# Patient Record
Sex: Male | Born: 1950 | Race: White | Hispanic: No | Marital: Married | Smoking: Current every day smoker
Health system: Southern US, Community
[De-identification: ages and names within clinical notes are randomized; demographics above are authoritative.]

## PROBLEM LIST (undated history)

## (undated) DIAGNOSIS — M1990 Unspecified osteoarthritis, unspecified site: Secondary | ICD-10-CM

## (undated) DIAGNOSIS — J189 Pneumonia, unspecified organism: Secondary | ICD-10-CM

## (undated) DIAGNOSIS — E785 Hyperlipidemia, unspecified: Secondary | ICD-10-CM

## (undated) DIAGNOSIS — I10 Essential (primary) hypertension: Secondary | ICD-10-CM

## (undated) DIAGNOSIS — K859 Acute pancreatitis without necrosis or infection, unspecified: Secondary | ICD-10-CM

## (undated) DIAGNOSIS — M109 Gout, unspecified: Secondary | ICD-10-CM

## (undated) HISTORY — PX: CHOLECYSTECTOMY: SHX55

## (undated) HISTORY — DX: Unspecified osteoarthritis, unspecified site: M19.90

## (undated) HISTORY — DX: Hyperlipidemia, unspecified: E78.5

---

## 1995-10-18 HISTORY — PX: KNEE SURGERY: SHX244

## 2008-07-25 ENCOUNTER — Emergency Department (HOSPITAL_COMMUNITY): Admission: EM | Admit: 2008-07-25 | Discharge: 2008-07-26 | Payer: Self-pay | Admitting: Emergency Medicine

## 2011-07-19 DIAGNOSIS — Z720 Tobacco use: Secondary | ICD-10-CM | POA: Insufficient documentation

## 2015-10-25 DIAGNOSIS — M25511 Pain in right shoulder: Secondary | ICD-10-CM | POA: Diagnosis not present

## 2015-11-03 DIAGNOSIS — M1009 Idiopathic gout, multiple sites: Secondary | ICD-10-CM | POA: Diagnosis not present

## 2015-11-03 DIAGNOSIS — F17218 Nicotine dependence, cigarettes, with other nicotine-induced disorders: Secondary | ICD-10-CM | POA: Diagnosis not present

## 2015-12-14 DIAGNOSIS — M10031 Idiopathic gout, right wrist: Secondary | ICD-10-CM | POA: Diagnosis not present

## 2015-12-14 DIAGNOSIS — M1009 Idiopathic gout, multiple sites: Secondary | ICD-10-CM | POA: Diagnosis not present

## 2015-12-24 DIAGNOSIS — M10031 Idiopathic gout, right wrist: Secondary | ICD-10-CM | POA: Diagnosis not present

## 2016-01-11 DIAGNOSIS — M25511 Pain in right shoulder: Secondary | ICD-10-CM | POA: Diagnosis not present

## 2016-01-11 DIAGNOSIS — M7551 Bursitis of right shoulder: Secondary | ICD-10-CM | POA: Diagnosis not present

## 2016-01-25 DIAGNOSIS — M25511 Pain in right shoulder: Secondary | ICD-10-CM | POA: Diagnosis not present

## 2016-01-25 DIAGNOSIS — M7551 Bursitis of right shoulder: Secondary | ICD-10-CM | POA: Diagnosis not present

## 2016-02-01 DIAGNOSIS — Z79899 Other long term (current) drug therapy: Secondary | ICD-10-CM | POA: Diagnosis not present

## 2016-02-01 DIAGNOSIS — M25511 Pain in right shoulder: Secondary | ICD-10-CM | POA: Diagnosis not present

## 2016-02-01 DIAGNOSIS — M79641 Pain in right hand: Secondary | ICD-10-CM | POA: Diagnosis not present

## 2016-02-01 DIAGNOSIS — M1009 Idiopathic gout, multiple sites: Secondary | ICD-10-CM | POA: Diagnosis not present

## 2016-02-29 DIAGNOSIS — R768 Other specified abnormal immunological findings in serum: Secondary | ICD-10-CM | POA: Diagnosis not present

## 2016-02-29 DIAGNOSIS — M1009 Idiopathic gout, multiple sites: Secondary | ICD-10-CM | POA: Diagnosis not present

## 2016-03-02 DIAGNOSIS — S161XXA Strain of muscle, fascia and tendon at neck level, initial encounter: Secondary | ICD-10-CM | POA: Diagnosis not present

## 2016-03-02 DIAGNOSIS — M9902 Segmental and somatic dysfunction of thoracic region: Secondary | ICD-10-CM | POA: Diagnosis not present

## 2016-03-02 DIAGNOSIS — M62830 Muscle spasm of back: Secondary | ICD-10-CM | POA: Diagnosis not present

## 2016-03-02 DIAGNOSIS — M9901 Segmental and somatic dysfunction of cervical region: Secondary | ICD-10-CM | POA: Diagnosis not present

## 2016-03-02 DIAGNOSIS — M9903 Segmental and somatic dysfunction of lumbar region: Secondary | ICD-10-CM | POA: Diagnosis not present

## 2016-03-02 DIAGNOSIS — M5382 Other specified dorsopathies, cervical region: Secondary | ICD-10-CM | POA: Diagnosis not present

## 2016-03-07 DIAGNOSIS — M9901 Segmental and somatic dysfunction of cervical region: Secondary | ICD-10-CM | POA: Diagnosis not present

## 2016-03-07 DIAGNOSIS — S161XXA Strain of muscle, fascia and tendon at neck level, initial encounter: Secondary | ICD-10-CM | POA: Diagnosis not present

## 2016-03-07 DIAGNOSIS — M62830 Muscle spasm of back: Secondary | ICD-10-CM | POA: Diagnosis not present

## 2016-03-07 DIAGNOSIS — M9903 Segmental and somatic dysfunction of lumbar region: Secondary | ICD-10-CM | POA: Diagnosis not present

## 2016-03-07 DIAGNOSIS — M9902 Segmental and somatic dysfunction of thoracic region: Secondary | ICD-10-CM | POA: Diagnosis not present

## 2016-03-07 DIAGNOSIS — M5382 Other specified dorsopathies, cervical region: Secondary | ICD-10-CM | POA: Diagnosis not present

## 2016-03-09 DIAGNOSIS — M9901 Segmental and somatic dysfunction of cervical region: Secondary | ICD-10-CM | POA: Diagnosis not present

## 2016-03-09 DIAGNOSIS — M9903 Segmental and somatic dysfunction of lumbar region: Secondary | ICD-10-CM | POA: Diagnosis not present

## 2016-03-09 DIAGNOSIS — M5382 Other specified dorsopathies, cervical region: Secondary | ICD-10-CM | POA: Diagnosis not present

## 2016-03-09 DIAGNOSIS — M9902 Segmental and somatic dysfunction of thoracic region: Secondary | ICD-10-CM | POA: Diagnosis not present

## 2016-03-09 DIAGNOSIS — M62830 Muscle spasm of back: Secondary | ICD-10-CM | POA: Diagnosis not present

## 2016-03-09 DIAGNOSIS — S161XXA Strain of muscle, fascia and tendon at neck level, initial encounter: Secondary | ICD-10-CM | POA: Diagnosis not present

## 2016-03-15 DIAGNOSIS — M62830 Muscle spasm of back: Secondary | ICD-10-CM | POA: Diagnosis not present

## 2016-03-15 DIAGNOSIS — M5382 Other specified dorsopathies, cervical region: Secondary | ICD-10-CM | POA: Diagnosis not present

## 2016-03-15 DIAGNOSIS — S161XXA Strain of muscle, fascia and tendon at neck level, initial encounter: Secondary | ICD-10-CM | POA: Diagnosis not present

## 2016-03-15 DIAGNOSIS — M9903 Segmental and somatic dysfunction of lumbar region: Secondary | ICD-10-CM | POA: Diagnosis not present

## 2016-03-15 DIAGNOSIS — M9902 Segmental and somatic dysfunction of thoracic region: Secondary | ICD-10-CM | POA: Diagnosis not present

## 2016-03-15 DIAGNOSIS — M9901 Segmental and somatic dysfunction of cervical region: Secondary | ICD-10-CM | POA: Diagnosis not present

## 2016-03-16 DIAGNOSIS — S161XXA Strain of muscle, fascia and tendon at neck level, initial encounter: Secondary | ICD-10-CM | POA: Diagnosis not present

## 2016-03-16 DIAGNOSIS — M9903 Segmental and somatic dysfunction of lumbar region: Secondary | ICD-10-CM | POA: Diagnosis not present

## 2016-03-16 DIAGNOSIS — M62830 Muscle spasm of back: Secondary | ICD-10-CM | POA: Diagnosis not present

## 2016-03-16 DIAGNOSIS — M9902 Segmental and somatic dysfunction of thoracic region: Secondary | ICD-10-CM | POA: Diagnosis not present

## 2016-03-16 DIAGNOSIS — M5382 Other specified dorsopathies, cervical region: Secondary | ICD-10-CM | POA: Diagnosis not present

## 2016-03-16 DIAGNOSIS — M9901 Segmental and somatic dysfunction of cervical region: Secondary | ICD-10-CM | POA: Diagnosis not present

## 2016-03-17 DIAGNOSIS — M9901 Segmental and somatic dysfunction of cervical region: Secondary | ICD-10-CM | POA: Diagnosis not present

## 2016-03-17 DIAGNOSIS — M62830 Muscle spasm of back: Secondary | ICD-10-CM | POA: Diagnosis not present

## 2016-03-17 DIAGNOSIS — M9902 Segmental and somatic dysfunction of thoracic region: Secondary | ICD-10-CM | POA: Diagnosis not present

## 2016-03-17 DIAGNOSIS — S161XXA Strain of muscle, fascia and tendon at neck level, initial encounter: Secondary | ICD-10-CM | POA: Diagnosis not present

## 2016-03-17 DIAGNOSIS — M9903 Segmental and somatic dysfunction of lumbar region: Secondary | ICD-10-CM | POA: Diagnosis not present

## 2016-03-17 DIAGNOSIS — M5382 Other specified dorsopathies, cervical region: Secondary | ICD-10-CM | POA: Diagnosis not present

## 2016-03-22 DIAGNOSIS — M5382 Other specified dorsopathies, cervical region: Secondary | ICD-10-CM | POA: Diagnosis not present

## 2016-03-22 DIAGNOSIS — M9903 Segmental and somatic dysfunction of lumbar region: Secondary | ICD-10-CM | POA: Diagnosis not present

## 2016-03-22 DIAGNOSIS — M62830 Muscle spasm of back: Secondary | ICD-10-CM | POA: Diagnosis not present

## 2016-03-22 DIAGNOSIS — S161XXA Strain of muscle, fascia and tendon at neck level, initial encounter: Secondary | ICD-10-CM | POA: Diagnosis not present

## 2016-03-22 DIAGNOSIS — M9902 Segmental and somatic dysfunction of thoracic region: Secondary | ICD-10-CM | POA: Diagnosis not present

## 2016-03-22 DIAGNOSIS — M9901 Segmental and somatic dysfunction of cervical region: Secondary | ICD-10-CM | POA: Diagnosis not present

## 2016-03-23 DIAGNOSIS — M9903 Segmental and somatic dysfunction of lumbar region: Secondary | ICD-10-CM | POA: Diagnosis not present

## 2016-03-23 DIAGNOSIS — M9901 Segmental and somatic dysfunction of cervical region: Secondary | ICD-10-CM | POA: Diagnosis not present

## 2016-03-23 DIAGNOSIS — M62830 Muscle spasm of back: Secondary | ICD-10-CM | POA: Diagnosis not present

## 2016-03-23 DIAGNOSIS — M5382 Other specified dorsopathies, cervical region: Secondary | ICD-10-CM | POA: Diagnosis not present

## 2016-03-23 DIAGNOSIS — S161XXA Strain of muscle, fascia and tendon at neck level, initial encounter: Secondary | ICD-10-CM | POA: Diagnosis not present

## 2016-03-23 DIAGNOSIS — M9902 Segmental and somatic dysfunction of thoracic region: Secondary | ICD-10-CM | POA: Diagnosis not present

## 2016-03-25 DIAGNOSIS — F17218 Nicotine dependence, cigarettes, with other nicotine-induced disorders: Secondary | ICD-10-CM | POA: Diagnosis not present

## 2016-03-25 DIAGNOSIS — M10031 Idiopathic gout, right wrist: Secondary | ICD-10-CM | POA: Diagnosis not present

## 2016-03-25 DIAGNOSIS — J410 Simple chronic bronchitis: Secondary | ICD-10-CM | POA: Diagnosis not present

## 2016-03-30 DIAGNOSIS — M9902 Segmental and somatic dysfunction of thoracic region: Secondary | ICD-10-CM | POA: Diagnosis not present

## 2016-03-30 DIAGNOSIS — M9901 Segmental and somatic dysfunction of cervical region: Secondary | ICD-10-CM | POA: Diagnosis not present

## 2016-03-30 DIAGNOSIS — M5382 Other specified dorsopathies, cervical region: Secondary | ICD-10-CM | POA: Diagnosis not present

## 2016-03-30 DIAGNOSIS — S161XXA Strain of muscle, fascia and tendon at neck level, initial encounter: Secondary | ICD-10-CM | POA: Diagnosis not present

## 2016-03-30 DIAGNOSIS — M9903 Segmental and somatic dysfunction of lumbar region: Secondary | ICD-10-CM | POA: Diagnosis not present

## 2016-03-30 DIAGNOSIS — M62830 Muscle spasm of back: Secondary | ICD-10-CM | POA: Diagnosis not present

## 2016-04-06 DIAGNOSIS — M9903 Segmental and somatic dysfunction of lumbar region: Secondary | ICD-10-CM | POA: Diagnosis not present

## 2016-04-06 DIAGNOSIS — M9902 Segmental and somatic dysfunction of thoracic region: Secondary | ICD-10-CM | POA: Diagnosis not present

## 2016-04-06 DIAGNOSIS — M62830 Muscle spasm of back: Secondary | ICD-10-CM | POA: Diagnosis not present

## 2016-04-06 DIAGNOSIS — M9901 Segmental and somatic dysfunction of cervical region: Secondary | ICD-10-CM | POA: Diagnosis not present

## 2016-04-06 DIAGNOSIS — M5382 Other specified dorsopathies, cervical region: Secondary | ICD-10-CM | POA: Diagnosis not present

## 2016-04-06 DIAGNOSIS — S161XXA Strain of muscle, fascia and tendon at neck level, initial encounter: Secondary | ICD-10-CM | POA: Diagnosis not present

## 2016-04-12 DIAGNOSIS — M9903 Segmental and somatic dysfunction of lumbar region: Secondary | ICD-10-CM | POA: Diagnosis not present

## 2016-04-12 DIAGNOSIS — M62830 Muscle spasm of back: Secondary | ICD-10-CM | POA: Diagnosis not present

## 2016-04-12 DIAGNOSIS — S161XXA Strain of muscle, fascia and tendon at neck level, initial encounter: Secondary | ICD-10-CM | POA: Diagnosis not present

## 2016-04-12 DIAGNOSIS — M9902 Segmental and somatic dysfunction of thoracic region: Secondary | ICD-10-CM | POA: Diagnosis not present

## 2016-04-12 DIAGNOSIS — M9901 Segmental and somatic dysfunction of cervical region: Secondary | ICD-10-CM | POA: Diagnosis not present

## 2016-04-12 DIAGNOSIS — M5382 Other specified dorsopathies, cervical region: Secondary | ICD-10-CM | POA: Diagnosis not present

## 2016-04-27 DIAGNOSIS — M9902 Segmental and somatic dysfunction of thoracic region: Secondary | ICD-10-CM | POA: Diagnosis not present

## 2016-04-27 DIAGNOSIS — M62830 Muscle spasm of back: Secondary | ICD-10-CM | POA: Diagnosis not present

## 2016-04-27 DIAGNOSIS — M5382 Other specified dorsopathies, cervical region: Secondary | ICD-10-CM | POA: Diagnosis not present

## 2016-04-27 DIAGNOSIS — S161XXA Strain of muscle, fascia and tendon at neck level, initial encounter: Secondary | ICD-10-CM | POA: Diagnosis not present

## 2016-04-27 DIAGNOSIS — M9903 Segmental and somatic dysfunction of lumbar region: Secondary | ICD-10-CM | POA: Diagnosis not present

## 2016-04-27 DIAGNOSIS — M9901 Segmental and somatic dysfunction of cervical region: Secondary | ICD-10-CM | POA: Diagnosis not present

## 2016-05-14 ENCOUNTER — Inpatient Hospital Stay (HOSPITAL_COMMUNITY): Payer: PPO

## 2016-05-14 ENCOUNTER — Emergency Department (HOSPITAL_COMMUNITY): Payer: PPO

## 2016-05-14 ENCOUNTER — Encounter (HOSPITAL_COMMUNITY): Payer: Self-pay | Admitting: *Deleted

## 2016-05-14 ENCOUNTER — Inpatient Hospital Stay (HOSPITAL_COMMUNITY)
Admission: EM | Admit: 2016-05-14 | Discharge: 2016-05-20 | DRG: 438 | Disposition: A | Discharge status: Home / Self Care | Payer: PPO | Attending: Internal Medicine | Admitting: Internal Medicine

## 2016-05-14 DIAGNOSIS — I509 Heart failure, unspecified: Secondary | ICD-10-CM | POA: Diagnosis not present

## 2016-05-14 DIAGNOSIS — K8309 Other cholangitis: Secondary | ICD-10-CM

## 2016-05-14 DIAGNOSIS — J9601 Acute respiratory failure with hypoxia: Secondary | ICD-10-CM | POA: Diagnosis not present

## 2016-05-14 DIAGNOSIS — E876 Hypokalemia: Secondary | ICD-10-CM | POA: Diagnosis not present

## 2016-05-14 DIAGNOSIS — Z79899 Other long term (current) drug therapy: Secondary | ICD-10-CM

## 2016-05-14 DIAGNOSIS — R0602 Shortness of breath: Secondary | ICD-10-CM

## 2016-05-14 DIAGNOSIS — K8050 Calculus of bile duct without cholangitis or cholecystitis without obstruction: Secondary | ICD-10-CM | POA: Diagnosis present

## 2016-05-14 DIAGNOSIS — R0600 Dyspnea, unspecified: Secondary | ICD-10-CM

## 2016-05-14 DIAGNOSIS — E8770 Fluid overload, unspecified: Secondary | ICD-10-CM | POA: Diagnosis not present

## 2016-05-14 DIAGNOSIS — R935 Abnormal findings on diagnostic imaging of other abdominal regions, including retroperitoneum: Secondary | ICD-10-CM | POA: Diagnosis not present

## 2016-05-14 DIAGNOSIS — R1011 Right upper quadrant pain: Secondary | ICD-10-CM | POA: Diagnosis not present

## 2016-05-14 DIAGNOSIS — I10 Essential (primary) hypertension: Secondary | ICD-10-CM | POA: Diagnosis not present

## 2016-05-14 DIAGNOSIS — K209 Esophagitis, unspecified: Secondary | ICD-10-CM | POA: Diagnosis not present

## 2016-05-14 DIAGNOSIS — T502X5A Adverse effect of carbonic-anhydrase inhibitors, benzothiadiazides and other diuretics, initial encounter: Secondary | ICD-10-CM | POA: Diagnosis present

## 2016-05-14 DIAGNOSIS — K8062 Calculus of gallbladder and bile duct with acute cholecystitis without obstruction: Secondary | ICD-10-CM | POA: Diagnosis not present

## 2016-05-14 DIAGNOSIS — R748 Abnormal levels of other serum enzymes: Secondary | ICD-10-CM | POA: Diagnosis present

## 2016-05-14 DIAGNOSIS — F17200 Nicotine dependence, unspecified, uncomplicated: Secondary | ICD-10-CM | POA: Diagnosis not present

## 2016-05-14 DIAGNOSIS — K219 Gastro-esophageal reflux disease without esophagitis: Secondary | ICD-10-CM | POA: Diagnosis present

## 2016-05-14 DIAGNOSIS — M109 Gout, unspecified: Secondary | ICD-10-CM | POA: Diagnosis present

## 2016-05-14 DIAGNOSIS — K859 Acute pancreatitis without necrosis or infection, unspecified: Secondary | ICD-10-CM | POA: Diagnosis not present

## 2016-05-14 DIAGNOSIS — R838 Other abnormal findings in cerebrospinal fluid: Secondary | ICD-10-CM | POA: Diagnosis not present

## 2016-05-14 DIAGNOSIS — K851 Biliary acute pancreatitis without necrosis or infection: Principal | ICD-10-CM | POA: Diagnosis present

## 2016-05-14 DIAGNOSIS — K8020 Calculus of gallbladder without cholecystitis without obstruction: Secondary | ICD-10-CM | POA: Diagnosis not present

## 2016-05-14 DIAGNOSIS — S335XXA Sprain of ligaments of lumbar spine, initial encounter: Secondary | ICD-10-CM | POA: Diagnosis not present

## 2016-05-14 DIAGNOSIS — K760 Fatty (change of) liver, not elsewhere classified: Secondary | ICD-10-CM | POA: Diagnosis not present

## 2016-05-14 DIAGNOSIS — K830 Cholangitis: Secondary | ICD-10-CM | POA: Diagnosis not present

## 2016-05-14 DIAGNOSIS — R17 Unspecified jaundice: Secondary | ICD-10-CM | POA: Diagnosis not present

## 2016-05-14 HISTORY — DX: Other cholangitis: K83.09

## 2016-05-14 HISTORY — DX: Biliary acute pancreatitis without necrosis or infection: K85.10

## 2016-05-14 LAB — URINALYSIS, ROUTINE W REFLEX MICROSCOPIC
GLUCOSE, UA: NEGATIVE mg/dL
HGB URINE DIPSTICK: NEGATIVE
Ketones, ur: NEGATIVE mg/dL
Nitrite: NEGATIVE
PH: 5 (ref 5.0–8.0)
Protein, ur: NEGATIVE mg/dL
SPECIFIC GRAVITY, URINE: 1.014 (ref 1.005–1.030)

## 2016-05-14 LAB — COMPREHENSIVE METABOLIC PANEL
ALBUMIN: 4.4 g/dL (ref 3.5–5.0)
ALT: 346 U/L — AB (ref 17–63)
AST: 147 U/L — AB (ref 15–41)
Alkaline Phosphatase: 291 U/L — ABNORMAL HIGH (ref 38–126)
Anion gap: 9 (ref 5–15)
BUN: 11 mg/dL (ref 6–20)
CHLORIDE: 107 mmol/L (ref 101–111)
CO2: 23 mmol/L (ref 22–32)
CREATININE: 0.8 mg/dL (ref 0.61–1.24)
Calcium: 9.4 mg/dL (ref 8.9–10.3)
GFR calc Af Amer: >60 mL/min (ref >60)
GLUCOSE: 128 mg/dL — AB (ref 65–99)
POTASSIUM: 3.7 mmol/L (ref 3.5–5.1)
SODIUM: 139 mmol/L (ref 135–145)
Total Bilirubin: 4.9 mg/dL — ABNORMAL HIGH (ref 0.3–1.2)
Total Protein: 7.6 g/dL (ref 6.5–8.1)

## 2016-05-14 LAB — CBC WITH DIFFERENTIAL/PLATELET
Basophils Absolute: 0 10*3/uL (ref 0.0–0.1)
Basophils Relative: 0 %
EOS ABS: 0.2 10*3/uL (ref 0.0–0.7)
EOS PCT: 2 %
HCT: 47.3 % (ref 39.0–52.0)
Hemoglobin: 16.6 g/dL (ref 13.0–17.0)
LYMPHS ABS: 2.5 10*3/uL (ref 0.7–4.0)
LYMPHS PCT: 22 %
MCH: 32.2 pg (ref 26.0–34.0)
MCHC: 35.1 g/dL (ref 30.0–36.0)
MCV: 91.8 fL (ref 78.0–100.0)
MONOS PCT: 8 %
Monocytes Absolute: 0.9 10*3/uL (ref 0.1–1.0)
Neutro Abs: 7.7 10*3/uL (ref 1.7–7.7)
Neutrophils Relative %: 68 %
PLATELETS: 233 10*3/uL (ref 150–400)
RBC: 5.15 MIL/uL (ref 4.22–5.81)
RDW: 13.5 % (ref 11.5–15.5)
WBC: 11.3 10*3/uL — ABNORMAL HIGH (ref 4.0–10.5)

## 2016-05-14 LAB — URINE MICROSCOPIC-ADD ON

## 2016-05-14 LAB — CBC
HEMATOCRIT: 43.4 % (ref 39.0–52.0)
HEMOGLOBIN: 15.1 g/dL (ref 13.0–17.0)
MCH: 32.3 pg (ref 26.0–34.0)
MCHC: 34.8 g/dL (ref 30.0–36.0)
MCV: 92.9 fL (ref 78.0–100.0)
Platelets: 188 10*3/uL (ref 150–400)
RBC: 4.67 MIL/uL (ref 4.22–5.81)
RDW: 13.7 % (ref 11.5–15.5)
WBC: 15.1 10*3/uL — AB (ref 4.0–10.5)

## 2016-05-14 LAB — CREATININE, SERUM
Creatinine, Ser: 0.86 mg/dL (ref 0.61–1.24)
GFR calc non Af Amer: >60 mL/min (ref >60)

## 2016-05-14 LAB — PROTIME-INR
INR: 0.97
Prothrombin Time: 12.9 seconds (ref 11.4–15.2)

## 2016-05-14 LAB — LIPASE, BLOOD: LIPASE: 2628 U/L — AB (ref 11–51)

## 2016-05-14 MED ORDER — ACETAMINOPHEN 325 MG PO TABS
650.0000 mg | ORAL_TABLET | Freq: Four times a day (QID) | ORAL | Status: DC | PRN
  Administered 2016-05-15 – 2016-05-20 (×2): 650 mg via ORAL
  Filled 2016-05-14 (×2): qty 2

## 2016-05-14 MED ORDER — HYDROMORPHONE HCL 1 MG/ML IJ SOLN
1.0000 mg | INTRAMUSCULAR | Status: DC | PRN
  Administered 2016-05-14 – 2016-05-19 (×24): 1 mg via INTRAVENOUS
  Filled 2016-05-14 (×25): qty 1

## 2016-05-14 MED ORDER — ONDANSETRON HCL 4 MG/2ML IJ SOLN
4.0000 mg | Freq: Four times a day (QID) | INTRAMUSCULAR | Status: DC | PRN
  Administered 2016-05-14 – 2016-05-15 (×3): 4 mg via INTRAVENOUS
  Filled 2016-05-14 (×3): qty 2

## 2016-05-14 MED ORDER — SODIUM CHLORIDE 0.9% FLUSH
3.0000 mL | Freq: Two times a day (BID) | INTRAVENOUS | Status: DC
  Administered 2016-05-19 – 2016-05-20 (×3): 3 mL via INTRAVENOUS

## 2016-05-14 MED ORDER — ONDANSETRON HCL 4 MG PO TABS: 4.0000 mg | ORAL_TABLET | Freq: Four times a day (QID) | ORAL | Status: DC | PRN

## 2016-05-14 MED ORDER — ONDANSETRON HCL 4 MG/2ML IJ SOLN
4.0000 mg | Freq: Once | INTRAMUSCULAR | Status: AC
  Administered 2016-05-14: 4 mg via INTRAVENOUS
  Filled 2016-05-14: qty 2

## 2016-05-14 MED ORDER — SODIUM CHLORIDE 0.9 % IV BOLUS (SEPSIS): 1000.0000 mL | INTRAVENOUS | Status: DC | PRN

## 2016-05-14 MED ORDER — ACETAMINOPHEN 650 MG RE SUPP: 650.0000 mg | Freq: Four times a day (QID) | RECTAL | Status: DC | PRN

## 2016-05-14 MED ORDER — SODIUM CHLORIDE 0.9 % IV SOLN
INTRAVENOUS | Status: AC
  Administered 2016-05-14 – 2016-05-15 (×3): via INTRAVENOUS

## 2016-05-14 MED ORDER — LORAZEPAM 2 MG/ML IJ SOLN
1.0000 mg | Freq: Once | INTRAMUSCULAR | Status: AC | PRN
  Administered 2016-05-14: 1 mg via INTRAVENOUS
  Filled 2016-05-14: qty 1

## 2016-05-14 MED ORDER — HYDROMORPHONE HCL 1 MG/ML IJ SOLN
1.0000 mg | Freq: Once | INTRAMUSCULAR | Status: AC
  Administered 2016-05-14: 1 mg via INTRAVENOUS
  Filled 2016-05-14: qty 1

## 2016-05-14 MED ORDER — MORPHINE SULFATE (PF) 4 MG/ML IV SOLN
4.0000 mg | Freq: Once | INTRAVENOUS | Status: AC
  Administered 2016-05-14: 4 mg via INTRAVENOUS
  Filled 2016-05-14: qty 1

## 2016-05-14 MED ORDER — PIPERACILLIN-TAZOBACTAM 3.375 G IVPB 30 MIN
3.3750 g | Freq: Once | INTRAVENOUS | Status: AC
  Administered 2016-05-14: 3.375 g via INTRAVENOUS
  Filled 2016-05-14: qty 50

## 2016-05-14 MED ORDER — HEPARIN SODIUM (PORCINE) 5000 UNIT/ML IJ SOLN
5000.0000 [IU] | Freq: Three times a day (TID) | INTRAMUSCULAR | Status: DC
Start: 2016-05-14 — End: 2016-05-16
  Administered 2016-05-14 – 2016-05-16 (×5): 5000 [IU] via SUBCUTANEOUS
  Filled 2016-05-14 (×5): qty 1

## 2016-05-14 MED ORDER — SODIUM CHLORIDE 0.9 % IV BOLUS (SEPSIS)
1000.0000 mL | Freq: Once | INTRAVENOUS | Status: AC
  Administered 2016-05-14: 1000 mL via INTRAVENOUS

## 2016-05-14 MED ORDER — PIPERACILLIN-TAZOBACTAM 3.375 G IVPB
3.3750 g | Freq: Three times a day (TID) | INTRAVENOUS | Status: DC
  Administered 2016-05-14 – 2016-05-20 (×18): 3.375 g via INTRAVENOUS
  Filled 2016-05-14 (×17): qty 50

## 2016-05-14 MED ORDER — HYDROCODONE-ACETAMINOPHEN 5-325 MG PO TABS
1.0000 | ORAL_TABLET | ORAL | Status: DC | PRN
Start: 2016-05-14 — End: 2016-05-20
  Administered 2016-05-16: 1 via ORAL
  Filled 2016-05-14: qty 1

## 2016-05-14 NOTE — H&P (Signed)
TRH H&P   Patient Demographics:    Gerald Odom, is a 65 y.o. male  MRN: [id number]   DOB - 03/23/1951  Admit Date - 05/14/2016  Outpatient Primary MD for the patient is No primary care provider on file.  Patient coming from: Home  Chief Complaint  Patient presents with  . Abdominal Pain  . Back Pain      HPI:    Gerald Odom  is a 65 y.o. male, with H/O gout otherwise healthy who came to the ER for sudden onset right upper quadrant and epigastric abdominal pain radiating to his back, pain is constant, worse by eating food better with rest, associated with nausea vomiting, denies any fever chills, denies any similar problems before. He came to the ER for this complaint in the ER he had lab work suggestive of gallstone pancreatitis with possible CBD stone with elevated lipase and liver enzymes. Right upper quadrant ultrasound was consistent with gallstones and possible acute cholecystitis. Gen. surgery and GI were consulted by ER and I was called to admit.    Review of systems:    In addition to the HPI above,   No Fever-chills, No Headache, No changes with Vision or hearing, No problems swallowing food or Liquids, No Chest pain, Cough or Shortness of Breath, Positive right upper quadrant and epigastric Abdominal pain radiating to his back associated with Nausea & Vommitting, Bowel movements are regular, No Blood in stool or Urine, No dysuria, No new skin rashes or bruises, No new joints pains-aches,  No new weakness, tingling, numbness in any extremity, No recent weight gain or loss, No polyuria, polydypsia or polyphagia, No significant Mental Stressors.  A full 10 point Review of Systems was done, except  as stated above, all other Review of Systems were negative.   With Past History of the following :    History reviewed. No pertinent past medical history.    History reviewed. No pertinent surgical history.    Social History:     Social History  Substance Use Topics  . Smoking status: Current Some Day Smoker  . Smokeless tobacco: Former Systems developer  . Alcohol use No         Family History :   History of cholecystitis and his brother   Home Medications:   Prior to Admission medications  Medication Sig Start Date End Date Taking? Authorizing Provider  allopurinol (ZYLOPRIM) 300 MG tablet Take 300 mg by mouth daily. 05/03/16  Yes Historical Provider, MD  CHANTIX 1 MG tablet Take 1 mg by mouth 2 (two) times daily. 03/28/16  Yes Historical Provider, MD  colchicine 0.6 MG tablet Take 0.6 mg by mouth daily. 03/25/16  Yes Historical Provider, MD  meloxicam (MOBIC) 15 MG tablet Take 15 mg by mouth daily. 05/14/16  Yes Historical Provider, MD  pantoprazole (PROTONIX) 40 MG tablet Take 40 mg by mouth daily. 05/14/16  Yes Historical Provider, MD     Allergies:    No Known Allergies   Physical Exam:   Vitals  Blood pressure 131/96, pulse 63, temperature 97.6 F (36.4 C), temperature source Oral, resp. rate 12, SpO2 (!) 89 %.   1. General middle aged white male lying in bed in NAD,    2. Normal affect and insight, Not Suicidal or Homicidal, Awake Alert, Oriented X 3.  3. No F.N deficits, ALL C.Nerves Intact, Strength 5/5 all 4 extremities, Sensation intact all 4 extremities, Plantars down going.  4. Ears and Eyes appear Normal, Conjunctivae clear, PERRLA. Moist Oral Mucosa.  5. Supple Neck, No JVD, No cervical lymphadenopathy appriciated, No Carotid Bruits.  6. Symmetrical Chest wall movement, Good air movement bilaterally, CTAB.  7. RRR, No Gallops, Rubs or Murmurs, No Parasternal Heave.  8. Positive Bowel Sounds, Abdomen Soft, RUQ tenderness, No organomegaly appriciated,No  rebound -guarding or rigidity.  9.  No Cyanosis, Normal Skin Turgor, No Skin Rash or Bruise.  10. Good muscle tone,  joints appear normal , no effusions, Normal ROM.  11. No Palpable Lymph Nodes in Neck or Axillae      Data Review:    CBC  Recent Labs Lab 05/14/16 1510  WBC 11.3*  HGB 16.6  HCT 47.3  PLT 233  MCV 91.8  MCH 32.2  MCHC 35.1  RDW 13.5  LYMPHSABS 2.5  MONOABS 0.9  EOSABS 0.2  BASOSABS 0.0   ------------------------------------------------------------------------------------------------------------------  Chemistries   Recent Labs Lab 05/14/16 1512  NA 139  K 3.7  CL 107  CO2 23  GLUCOSE 128*  BUN 11  CREATININE 0.80  CALCIUM 9.4  AST 147*  ALT 346*  ALKPHOS 291*  BILITOT 4.9*   ------------------------------------------------------------------------------------------------------------------ CrCl cannot be calculated (Unknown ideal weight.). ------------------------------------------------------------------------------------------------------------------ No results for input(s): TSH, T4TOTAL, T3FREE, THYROIDAB in the last 72 hours.  Invalid input(s): FREET3  Coagulation profile No results for input(s): INR, PROTIME in the last 168 hours. ------------------------------------------------------------------------------------------------------------------- No results for input(s): DDIMER in the last 72 hours. -------------------------------------------------------------------------------------------------------------------  Cardiac Enzymes No results for input(s): CKMB, TROPONINI, MYOGLOBIN in the last 168 hours.  Invalid input(s): CK ------------------------------------------------------------------------------------------------------------------ No results found for: BNP   ---------------------------------------------------------------------------------------------------------------  Urinalysis    Component Value Date/Time    COLORURINE ORANGE (A) 05/14/2016 1610   APPEARANCEUR CLEAR 05/14/2016 1610   LABSPEC 1.014 05/14/2016 1610   PHURINE 5.0 05/14/2016 1610   GLUCOSEU NEGATIVE 05/14/2016 1610   HGBUR NEGATIVE 05/14/2016 1610   BILIRUBINUR LARGE (A) 05/14/2016 1610   KETONESUR NEGATIVE 05/14/2016 1610   PROTEINUR NEGATIVE 05/14/2016 1610   NITRITE NEGATIVE 05/14/2016 1610   LEUKOCYTESUR TRACE (A) 05/14/2016 1610    ----------------------------------------------------------------------------------------------------------------   Imaging Results:    US Abdomen Limited Ruq  Result Date: 05/14/2016 CLINICAL DATA:  65 year old male with severe acute right upper quadrant abdominal pain for 2 days. EXAM: US ABDOMEN LIMITED - RIGHT UPPER QUADRANT COMPARISON:  None. FINDINGS:  Gallbladder: The gallbladder is distended filled with sludge and gallstones, the largest measuring 7 mm. Small amount of pericholecystic fluid is noted with equivocal sonographic Murphy's sign. Common bile duct: Diameter: 7 mm.  Mild intrahepatic biliary dilatation identified. Liver: Slight increased echogenicity noted. An ill-defined 1.5 cm hypoechoic area within the central liver is indeterminate. IMPRESSION: Distended gallbladder with sludge and cholelithiasis. Given pericholecystic fluid and equivocal sonographic Murphy's sign, this is suspicious for acute cholecystitis. Mild intrahepatic biliary dilatation. Probable hepatic steatosis. 1.5 cm hypoechoic area within the central liver is indeterminate. Consider elective MR evaluation. Electronically Signed   By: Margarette Canada M.D.   On: 05/14/2016 17:40   My personal review of EKG: Rhythm NSR,  no Acute ST changes   Assessment & Plan:     1. Gallstone pancreatitis with possible ascending cholangitis and acute cholecystitis based on exam. Patient will be admitted to stepdown for Cozaar. Monitoring for the next 24 hours, IV fluid bolus and maintenance, bowel rest, empiric IV Zosyn, blood cultures  have been drawn although he remains afebrile till now. For now he appears nontoxic. I have ordered MRCP, GI and general surgery have been consulted he may require ERCP based on his MRCP results and lab work along with eventual cholecystectomy.   Baseline EKG is unremarkable will obtain baseline chest x-ray, he has good metastases he can climb 3-4 flights of stairs without any problems, no known heart or lung issues.  2. History of gout. Currently stable no acute issues. Supportive care for now.     DVT Prophylaxis Heparin    AM Labs Ordered, also please review Full Orders  Family Communication: Admission, patients condition and plan of care including tests being ordered have been discussed with the patient and wife who indicate understanding and agree with the plan and Code Status.  Code Status Full  Likely DC to  Home  Condition GUARDED    Consults called: GI & CCS called by ER    Admission status: Inpt   Time spent in minutes : 35   SINGH,PRASHANT K M.D on 05/14/2016 at 6:53 PM  Between 7am to 7pm - Pager - [phone]. After 7pm go to [email or web address] - password [email or web address]  [facility] - Office  [phone]

## 2016-05-14 NOTE — ED Triage Notes (Signed)
Pt complains of central abdominal pain, right lower back pain since Thursday. Pt denies n/v/d. Pt states his pain was worse today ~1 hour after eating a waffle. Pt states his urine appeared orange. Pt denies odors or burning to urine. Pt states he was working in the hot sun Thursday before his symptoms.

## 2016-05-14 NOTE — ED Notes (Signed)
Called floor timer started 20 mins

## 2016-05-14 NOTE — Consult Note (Signed)
Chief Complaint:  RUQ pain, elevated LFTs and lipase  History of Present Illness:  Gerald Odom is an 65 y.o. male what became ill on Thursday with progressive worsening of pain.  When it became unbearable, he came to the ER where an ultrasound was consistent with cholecystitis.    History reviewed. No pertinent past medical history.  History reviewed. No pertinent surgical history.  Current Facility-Administered Medications  Medication Dose Route Frequency Provider Last Rate Last Dose  . 0.9 %  sodium chloride infusion   Intravenous Continuous Thurnell Lose, MD      . HYDROmorphone (DILAUDID) injection 1 mg  1 mg Intravenous Q2H PRN Thurnell Lose, MD      . LORazepam (ATIVAN) injection 1 mg  1 mg Intravenous Once PRN Thurnell Lose, MD      . Derrill Memo ON 05/15/2016] piperacillin-tazobactam (ZOSYN) IVPB 3.375 g  3.375 g Intravenous Q8H Dara Hoyer, [facility]       Current Outpatient Prescriptions  Medication Sig Dispense Refill  . allopurinol (ZYLOPRIM) 300 MG tablet Take 300 mg by mouth daily.    . CHANTIX 1 MG tablet Take 1 mg by mouth 2 (two) times daily.    . colchicine 0.6 MG tablet Take 0.6 mg by mouth daily.    . meloxicam (MOBIC) 15 MG tablet Take 15 mg by mouth daily.    . pantoprazole (PROTONIX) 40 MG tablet Take 40 mg by mouth daily.     Review of patient's allergies indicates no known allergies. No family history on file. Social History:   reports that he has been smoking.  He has quit using smokeless tobacco. He reports that he does not drink alcohol. His drug history is not on file.   REVIEW OF SYSTEMS : Negative except for see problem list  Physical Exam:   Blood pressure 131/96, pulse 63, temperature 97.6 F (36.4 C), temperature source Oral, resp. rate 12, SpO2 (!) 89 %. There is no height or weight on file to calculate BMI.  Gen:  WDWN WM with RUQ pain Neurological: Alert and oriented to person, place, and time. Motor and sensory function is grossly intact   Head: Normocephalic and atraumatic.  Eyes: Conjunctivae are normal. Pupils are equal, round, and reactive to light Neck: Normal range of motion. Neck supple. No tracheal deviation or thyromegaly present.  Cardiovascular:  SR without murmurs or gallops.  No carotid bruits Breast:  Not examined Respiratory: Effort normal.  No respiratory distress. No chest wall tenderness. Breath sounds normal.  No wheezes, rales or rhonchi.  Abdomen:  Sore in the right upper quadrand GU:  Not examined Musculoskeletal: Normal range of motion. Extremities are nontender. No cyanosis, edema or clubbing noted Lymphadenopathy: No cervical, preauricular, postauricular or axillary adenopathy is present Skin: Skin is warm and dry. No rash noted. No diaphoresis. No erythema. No pallor. Pscyh: Normal mood and affect. Behavior is normal. Judgment and thought content normal.   LABORATORY RESULTS: Results for orders placed or performed during the hospital encounter of 05/14/16 (from the past 48 hour(s))  CBC with Differential/Platelet     Status: Abnormal   Collection Time: 05/14/16  3:10 PM  Result Value Ref Range   WBC 11.3 (H) 4.0 - 10.5 K/uL   RBC 5.15 4.22 - 5.81 MIL/uL   Hemoglobin 16.6 13.0 - 17.0 g/dL   HCT 47.3 39.0 - 52.0 %   MCV 91.8 78.0 - 100.0 fL   MCH 32.2 26.0 - 34.0 pg   MCHC  35.1 30.0 - 36.0 g/dL   RDW 13.5 11.5 - 15.5 %   Platelets 233 150 - 400 K/uL   Neutrophils Relative % 68 %   Neutro Abs 7.7 1.7 - 7.7 K/uL   Lymphocytes Relative 22 %   Lymphs Abs 2.5 0.7 - 4.0 K/uL   Monocytes Relative 8 %   Monocytes Absolute 0.9 0.1 - 1.0 K/uL   Eosinophils Relative 2 %   Eosinophils Absolute 0.2 0.0 - 0.7 K/uL   Basophils Relative 0 %   Basophils Absolute 0.0 0.0 - 0.1 K/uL  Lipase, blood     Status: Abnormal   Collection Time: 05/14/16  3:12 PM  Result Value Ref Range   Lipase 2,628 (H) 11 - 51 U/L    Comment: RESULTS CONFIRMED BY MANUAL DILUTION  Comprehensive metabolic panel     Status:  Abnormal   Collection Time: 05/14/16  3:12 PM  Result Value Ref Range   Sodium 139 135 - 145 mmol/L   Potassium 3.7 3.5 - 5.1 mmol/L   Chloride 107 101 - 111 mmol/L   CO2 23 22 - 32 mmol/L   Glucose, Bld 128 (H) 65 - 99 mg/dL   BUN 11 6 - 20 mg/dL   Creatinine, Ser 0.80 0.61 - 1.24 mg/dL   Calcium 9.4 8.9 - 10.3 mg/dL   Total Protein 7.6 6.5 - 8.1 g/dL   Albumin 4.4 3.5 - 5.0 g/dL   AST 147 (H) 15 - 41 U/L   ALT 346 (H) 17 - 63 U/L   Alkaline Phosphatase 291 (H) 38 - 126 U/L   Total Bilirubin 4.9 (H) 0.3 - 1.2 mg/dL   GFR calc non Af Amer >60 >60 mL/min   GFR calc Af Amer >60 >60 mL/min    Comment: (NOTE) The eGFR has been calculated using the CKD EPI equation. This calculation has not been validated in all clinical situations. eGFR's persistently <60 mL/min signify possible Chronic Kidney Disease.    Anion gap 9 5 - 15  Urinalysis, Routine w reflex microscopic     Status: Abnormal   Collection Time: 05/14/16  4:10 PM  Result Value Ref Range   Color, Urine ORANGE (A) YELLOW    Comment: BIOCHEMICALS MAY BE AFFECTED BY COLOR   APPearance CLEAR CLEAR   Specific Gravity, Urine 1.014 1.005 - 1.030   pH 5.0 5.0 - 8.0   Glucose, UA NEGATIVE NEGATIVE mg/dL   Hgb urine dipstick NEGATIVE NEGATIVE   Bilirubin Urine LARGE (A) NEGATIVE   Ketones, ur NEGATIVE NEGATIVE mg/dL   Protein, ur NEGATIVE NEGATIVE mg/dL   Nitrite NEGATIVE NEGATIVE   Leukocytes, UA TRACE (A) NEGATIVE  Urine microscopic-add on     Status: Abnormal   Collection Time: 05/14/16  4:10 PM  Result Value Ref Range   Squamous Epithelial / LPF 0-5 (A) NONE SEEN   WBC, UA 0-5 0 - 5 WBC/hpf   RBC / HPF 0-5 0 - 5 RBC/hpf   Bacteria, UA RARE (A) NONE SEEN     RADIOLOGY RESULTS: Dg Chest Port 1 View  Result Date: 05/14/2016 CLINICAL DATA:  Shortness of breath, central abdominal pain and right lower back pain since Thursday. Pain worse today. EXAM: PORTABLE CHEST 1 VIEW COMPARISON:  None. FINDINGS: Heart size is  normal. Overall cardiomediastinal silhouette is within normal limits in size and configuration. Probable fibrosis and/or emphysematous change within the upper lobes bilaterally. Questionable fibrosis at each lung base. No confluent opacity to suggest a developing pneumonia. No pleural  effusion or pneumothorax seen. Osseous structures about the chest are unremarkable. IMPRESSION: 1. No acute findings. No evidence of pneumonia. Heart size is normal. 2. Suspect fibrosis and/or emphysematous change within the upper lobes bilaterally. Questionable fibrosis at each lung base. Electronically Signed   By: Franki Cabot M.D.   On: 05/14/2016 19:18  US Abdomen Limited Ruq  Result Date: 05/14/2016 CLINICAL DATA:  65 year old male with severe acute right upper quadrant abdominal pain for 2 days. EXAM: US ABDOMEN LIMITED - RIGHT UPPER QUADRANT COMPARISON:  None. FINDINGS: Gallbladder: The gallbladder is distended filled with sludge and gallstones, the largest measuring 7 mm. Small amount of pericholecystic fluid is noted with equivocal sonographic Murphy's sign. Common bile duct: Diameter: 7 mm.  Mild intrahepatic biliary dilatation identified. Liver: Slight increased echogenicity noted. An ill-defined 1.5 cm hypoechoic area within the central liver is indeterminate. IMPRESSION: Distended gallbladder with sludge and cholelithiasis. Given pericholecystic fluid and equivocal sonographic Murphy's sign, this is suspicious for acute cholecystitis. Mild intrahepatic biliary dilatation. Probable hepatic steatosis. 1.5 cm hypoechoic area within the central liver is indeterminate. Consider elective MR evaluation. Electronically Signed   By: Margarette Canada M.D.   On: 05/14/2016 17:40   Problem List: Patient Active Problem List   Diagnosis Date Noted  . Gout 05/14/2016  . GERD (gastroesophageal reflux disease) 05/14/2016  . Gall stone pancreatitis 05/14/2016  . Acute cholangitis 05/14/2016  . Gallstone pancreatitis 05/14/2016     Assessment & Plan: Cholecystitis with pancreatitis;   I discussed with Dr. Candiss Norse.  IV hydration, antibiotics and MRCP.  Either cholecystostomy or ectomy following cool off period.   CCS MD will follow    Matt B. Hassell Done, MD, [facility] [phone] beeper [phone]  05/14/2016 8:05 PM

## 2016-05-14 NOTE — ED Provider Notes (Signed)
[facility] DEPT Provider Note   CSN: [id number] Arrival date & time: 05/14/16  1447  First Provider Contact:  First MD Initiated Contact with Patient 05/14/16 1532        History   Chief Complaint Chief Complaint  Patient presents with  . Abdominal Pain  . Back Pain    HPI Gerald Odom is a 65 y.o. male.  Patient presents to the ED with a chief complaint of abdominal pain, vomiting, and back pain.  He states that he has been having the symptoms intermittently for the past week, but today they worsened significantly.  He reports associated nausea and vomiting.  States that his symptoms are worsened after eating.  He states that it feels like his pain radiates to his back.  He denies any fever, chills, cough, CP, SOB, dysuria, or hematuria.  Denies history of kidney stones.  Denies history of recent alcohol use.   The history is provided by the patient. No language interpreter was used.    History reviewed. No pertinent past medical history.  There are no active problems to display for this patient.   History reviewed. No pertinent surgical history.     Home Medications    Prior to Admission medications   Not on File    Family History No family history on file.  Social History Social History  Substance Use Topics  . Smoking status: Current Some Day Smoker  . Smokeless tobacco: Former Systems developer  . Alcohol use No     Allergies   Review of patient's allergies indicates not on file.   Review of Systems Review of Systems  Gastrointestinal: Positive for abdominal pain, nausea and vomiting.  All other systems reviewed and are negative.    Physical Exam Updated Vital Signs BP 166/86 (BP Location: Left Arm)   Pulse 82   Temp 97.6 F (36.4 C) (Oral)   Resp 18   SpO2 92%   Physical Exam  Constitutional: He is oriented to person, place, and time. He appears well-developed and well-nourished.  HENT:  Head: Normocephalic and atraumatic.  Eyes:  Conjunctivae and EOM are normal. Pupils are equal, round, and reactive to light. Right eye exhibits no discharge. Left eye exhibits no discharge. No scleral icterus.  Neck: Normal range of motion. Neck supple. No JVD present.  Cardiovascular: Normal rate, regular rhythm and normal heart sounds.  Exam reveals no gallop and no friction rub.   No murmur heard. Pulmonary/Chest: Effort normal and breath sounds normal. No respiratory distress. He has no wheezes. He has no rales. He exhibits no tenderness.  Abdominal: Soft. He exhibits no distension and no mass. There is tenderness. There is no rebound and no guarding.  RUQ ttp, no other focal abdominal tenderness, no CVA tenderness  Musculoskeletal: Normal range of motion. He exhibits no edema or tenderness.  Neurological: He is alert and oriented to person, place, and time.  Skin: Skin is warm and dry.  Psychiatric: He has a normal mood and affect. His behavior is normal. Judgment and thought content normal.  Nursing note and vitals reviewed.    ED Treatments / Results  Labs (all labs ordered are listed, but only abnormal results are displayed) Labs Reviewed  LIPASE, BLOOD  COMPREHENSIVE METABOLIC PANEL  URINALYSIS, ROUTINE W REFLEX MICROSCOPIC (NOT AT [facility])  CBC WITH DIFFERENTIAL/PLATELET    EKG  EKG Interpretation None       Radiology No results found.  Procedures Procedures (including critical care time)  Medications Ordered in ED  Medications  HYDROmorphone (DILAUDID) injection 1 mg (not administered)  ondansetron (ZOFRAN) injection 4 mg (not administered)     Initial Impression / Assessment and Plan / ED Course  I have reviewed the triage vital signs and the nursing notes.  Pertinent labs & imaging results that were available during my care of the patient were reviewed by me and considered in my medical decision making (see chart for details).  Clinical Course    Patient with RUQ pain that radiates to back.  Worse  after eating.  No hematuria or dysuria.  Will check RUQ [place] and labs.  Consider renal study if negative [place].  Patient seen by and discussed with Dr. Ellender Hose, who performed bedside FAST.  States that gallbladder is enlarged with wall thickening and numerous stones.  Agrees with original plan for formal RUQ [place] and probable surgical consultation.  Patient is afebrile, mildly hypertensive, and NOT tachycardic.  Appears uncomfortable, but does appear toxic.  RUQ [place] as above.  LFTs and lipase are elevated.  Discussed the patient with Dr. Hassell Done of general surgery, who states that given length of symptoms, age, and elevated bili, LFTs, and lipase may be cholangitis.  Recommends medicine admit and Dr. Hassell Done will see patient either tonight or tomorrow morning.  Recommends starting abx.  Discussed with Dr. Ellender Hose, who recommends adding blood cultures.  Discussed with Dr. Candiss Norse from [facility], who recommends GI consult for possible ERCP tonight.  7:33 PM Appreciate Dr. Hilarie Fredrickson from [facility] for telephone consultation.  Reviewed labs and [place] with me on the phone.  States that as the patient is afebrile, will not require emergent ERCP, but still may need one in the future.  Will see patient in the morning.  Tells me that he will notify Dr. Candiss Norse of his plan.  States that he is available to answer any other questions from Hospitalist service and can be consulted directly.  Final Clinical Impressions(s) / ED Diagnoses   Final diagnoses:  RUQ pain  SOB (shortness of breath)  Gallstone pancreatitis    New Prescriptions New Prescriptions   No medications on file     Montine Circle, PA-C 05/14/16 1941    Duffy Bruce, MD 05/14/16 2125

## 2016-05-14 NOTE — ED Notes (Signed)
Pt in MRI and v/s up to date. All drips stopped.

## 2016-05-14 NOTE — ED Notes (Signed)
antibiotic stopped in mri

## 2016-05-14 NOTE — Progress Notes (Signed)
Pharmacy Antibiotic Note  CLARON CAPPELL is a 65 y.o. male admitted on 05/14/2016 with intra-abdominal infection.  Pharmacy has been consulted for Zosyn dosing.  Plan: Zosyn 3.375g IV q8h (4 hour infusion time).      Temp (24hrs), Avg:97.6 F (36.4 C), Min:97.6 F (36.4 C), Max:97.6 F (36.4 C)   Recent Labs Lab 05/14/16 1510 05/14/16 1512  WBC 11.3*  --   CREATININE  --  0.80    CrCl cannot be calculated (Unknown ideal weight.).    No Known Allergies  Antimicrobials this admission: 7/29 Zosyn >>   Dose adjustments this admission: -  Microbiology results: 7/29 BCx: ordered  Thank you for allowing pharmacy to be a part of this patient's care.  Hershal Coria 05/14/2016 6:13 PM

## 2016-05-15 DIAGNOSIS — K851 Biliary acute pancreatitis without necrosis or infection: Secondary | ICD-10-CM | POA: Diagnosis not present

## 2016-05-15 LAB — COMPREHENSIVE METABOLIC PANEL
ALBUMIN: 3.8 g/dL (ref 3.5–5.0)
ALK PHOS: 241 U/L — AB (ref 38–126)
ALT: 262 U/L — ABNORMAL HIGH (ref 17–63)
ANION GAP: 6 (ref 5–15)
AST: 97 U/L — ABNORMAL HIGH (ref 15–41)
BUN: 13 mg/dL (ref 6–20)
CALCIUM: 8.2 mg/dL — AB (ref 8.9–10.3)
CHLORIDE: 110 mmol/L (ref 101–111)
CO2: 24 mmol/L (ref 22–32)
Creatinine, Ser: 0.82 mg/dL (ref 0.61–1.24)
GFR calc non Af Amer: >60 mL/min (ref >60)
Glucose, Bld: 128 mg/dL — ABNORMAL HIGH (ref 65–99)
POTASSIUM: 4.1 mmol/L (ref 3.5–5.1)
SODIUM: 140 mmol/L (ref 135–145)
Total Bilirubin: 4.1 mg/dL — ABNORMAL HIGH (ref 0.3–1.2)
Total Protein: 6.4 g/dL — ABNORMAL LOW (ref 6.5–8.1)

## 2016-05-15 LAB — CBC
HEMATOCRIT: 43.4 % (ref 39.0–52.0)
HEMOGLOBIN: 14.5 g/dL (ref 13.0–17.0)
MCH: 31.2 pg (ref 26.0–34.0)
MCHC: 33.4 g/dL (ref 30.0–36.0)
MCV: 93.3 fL (ref 78.0–100.0)
Platelets: 197 10*3/uL (ref 150–400)
RBC: 4.65 MIL/uL (ref 4.22–5.81)
RDW: 13.8 % (ref 11.5–15.5)
WBC: 15 10*3/uL — ABNORMAL HIGH (ref 4.0–10.5)

## 2016-05-15 LAB — MRSA PCR SCREENING: MRSA by PCR: NEGATIVE

## 2016-05-15 MED ORDER — SODIUM CHLORIDE 0.9 % IV SOLN
INTRAVENOUS | Status: DC
  Administered 2016-05-15 – 2016-05-17 (×6): via INTRAVENOUS

## 2016-05-15 NOTE — Progress Notes (Signed)
Patient ID: Gerald Odom, male   DOB: 07-10-1951, 65 y.o.   MRN: [id number] [facility] Surgery Progress Note:   * No surgery found *  Subjective: Mental status is clear.  Still hurting in midepigastrium radiating into the back Objective: Vital signs in last 24 hours: Temp:  [97.5 F (36.4 C)-98.6 F (37 C)] 97.5 F (36.4 C) (07/30 0800) Pulse Rate:  [63-96] 96 (07/30 0700) Resp:  [12-21] 12 (07/30 0700) BP: (119-173)/(77-99) 152/96 (07/30 0700) SpO2:  [89 %-97 %] 94 % (07/30 0700) Weight:  [73.2 kg (161 lb 6 oz)-73.4 kg (161 lb 13.1 oz)] 73.2 kg (161 lb 6 oz) (07/30 0500)  Intake/Output from previous day: 07/29 0701 - 07/30 0700 In: -  Out: 300 [Urine:300] Intake/Output this shift: No intake/output data recorded.  Physical Exam: Work of breathing is not labored.  Tender in midepigastrium radiating into the back  Lab Results:  Results for orders placed or performed during the hospital encounter of 05/14/16 (from the past 48 hour(s))  CBC with Differential/Platelet     Status: Abnormal   Collection Time: 05/14/16  3:10 PM  Result Value Ref Range   WBC 11.3 (H) 4.0 - 10.5 K/uL   RBC 5.15 4.22 - 5.81 MIL/uL   Hemoglobin 16.6 13.0 - 17.0 g/dL   HCT 47.3 39.0 - 52.0 %   MCV 91.8 78.0 - 100.0 fL   MCH 32.2 26.0 - 34.0 pg   MCHC 35.1 30.0 - 36.0 g/dL   RDW 13.5 11.5 - 15.5 %   Platelets 233 150 - 400 K/uL   Neutrophils Relative % 68 %   Neutro Abs 7.7 1.7 - 7.7 K/uL   Lymphocytes Relative 22 %   Lymphs Abs 2.5 0.7 - 4.0 K/uL   Monocytes Relative 8 %   Monocytes Absolute 0.9 0.1 - 1.0 K/uL   Eosinophils Relative 2 %   Eosinophils Absolute 0.2 0.0 - 0.7 K/uL   Basophils Relative 0 %   Basophils Absolute 0.0 0.0 - 0.1 K/uL  Lipase, blood     Status: Abnormal   Collection Time: 05/14/16  3:12 PM  Result Value Ref Range   Lipase 2,628 (H) 11 - 51 U/L    Comment: RESULTS CONFIRMED BY MANUAL DILUTION  Comprehensive metabolic panel     Status: Abnormal   Collection Time:  05/14/16  3:12 PM  Result Value Ref Range   Sodium 139 135 - 145 mmol/L   Potassium 3.7 3.5 - 5.1 mmol/L   Chloride 107 101 - 111 mmol/L   CO2 23 22 - 32 mmol/L   Glucose, Bld 128 (H) 65 - 99 mg/dL   BUN 11 6 - 20 mg/dL   Creatinine, Ser 0.80 0.61 - 1.24 mg/dL   Calcium 9.4 8.9 - 10.3 mg/dL   Total Protein 7.6 6.5 - 8.1 g/dL   Albumin 4.4 3.5 - 5.0 g/dL   AST 147 (H) 15 - 41 U/L   ALT 346 (H) 17 - 63 U/L   Alkaline Phosphatase 291 (H) 38 - 126 U/L   Total Bilirubin 4.9 (H) 0.3 - 1.2 mg/dL   GFR calc non Af Amer >60 >60 mL/min   GFR calc Af Amer >60 >60 mL/min    Comment: (NOTE) The eGFR has been calculated using the CKD EPI equation. This calculation has not been validated in all clinical situations. eGFR's persistently <60 mL/min signify possible Chronic Kidney Disease.    Anion gap 9 5 - 15  Protime-INR  Status: None   Collection Time: 05/14/16  3:18 PM  Result Value Ref Range   Prothrombin Time 12.9 11.4 - 15.2 seconds   INR 0.97   Urinalysis, Routine w reflex microscopic     Status: Abnormal   Collection Time: 05/14/16  4:10 PM  Result Value Ref Range   Color, Urine ORANGE (A) YELLOW    Comment: BIOCHEMICALS MAY BE AFFECTED BY COLOR   APPearance CLEAR CLEAR   Specific Gravity, Urine 1.014 1.005 - 1.030   pH 5.0 5.0 - 8.0   Glucose, UA NEGATIVE NEGATIVE mg/dL   Hgb urine dipstick NEGATIVE NEGATIVE   Bilirubin Urine LARGE (A) NEGATIVE   Ketones, ur NEGATIVE NEGATIVE mg/dL   Protein, ur NEGATIVE NEGATIVE mg/dL   Nitrite NEGATIVE NEGATIVE   Leukocytes, UA TRACE (A) NEGATIVE  Urine microscopic-add on     Status: Abnormal   Collection Time: 05/14/16  4:10 PM  Result Value Ref Range   Squamous Epithelial / LPF 0-5 (A) NONE SEEN   WBC, UA 0-5 0 - 5 WBC/hpf   RBC / HPF 0-5 0 - 5 RBC/hpf   Bacteria, UA RARE (A) NONE SEEN  Blood culture (routine x 2)     Status: None (Preliminary result)   Collection Time: 05/14/16  6:34 PM  Result Value Ref Range   Specimen  Description BLOOD RIGHT ANTECUBITAL    Special Requests BOTTLES DRAWN AEROBIC AND ANAEROBIC 5CC    Culture PENDING    Report Status PENDING   Blood culture (routine x 2)     Status: None (Preliminary result)   Collection Time: 05/14/16  6:35 PM  Result Value Ref Range   Specimen Description BLOOD LEFT ANTECUBITAL    Special Requests BOTTLES DRAWN AEROBIC AND ANAEROBIC 5CC    Culture PENDING    Report Status PENDING   MRSA PCR Screening     Status: None   Collection Time: 05/14/16 10:42 PM  Result Value Ref Range   MRSA by PCR NEGATIVE NEGATIVE    Comment:        The GeneXpert MRSA Assay (FDA approved for NASAL specimens only), is one component of a comprehensive MRSA colonization surveillance program. It is not intended to diagnose MRSA infection nor to guide or monitor treatment for MRSA infections.   CBC     Status: Abnormal   Collection Time: 05/14/16 11:06 PM  Result Value Ref Range   WBC 15.1 (H) 4.0 - 10.5 K/uL   RBC 4.67 4.22 - 5.81 MIL/uL   Hemoglobin 15.1 13.0 - 17.0 g/dL   HCT 43.4 39.0 - 52.0 %   MCV 92.9 78.0 - 100.0 fL   MCH 32.3 26.0 - 34.0 pg   MCHC 34.8 30.0 - 36.0 g/dL   RDW 13.7 11.5 - 15.5 %   Platelets 188 150 - 400 K/uL  Creatinine, serum     Status: None   Collection Time: 05/14/16 11:06 PM  Result Value Ref Range   Creatinine, Ser 0.86 0.61 - 1.24 mg/dL   GFR calc non Af Amer >60 >60 mL/min   GFR calc Af Amer >60 >60 mL/min    Comment: (NOTE) The eGFR has been calculated using the CKD EPI equation. This calculation has not been validated in all clinical situations. eGFR's persistently <60 mL/min signify possible Chronic Kidney Disease.   Comprehensive metabolic panel     Status: Abnormal   Collection Time: 05/15/16  3:28 AM  Result Value Ref Range   Sodium 140 135 - 145 mmol/L  Potassium 4.1 3.5 - 5.1 mmol/L   Chloride 110 101 - 111 mmol/L   CO2 24 22 - 32 mmol/L   Glucose, Bld 128 (H) 65 - 99 mg/dL   BUN 13 6 - 20 mg/dL   Creatinine,  Ser 0.82 0.61 - 1.24 mg/dL   Calcium 8.2 (L) 8.9 - 10.3 mg/dL   Total Protein 6.4 (L) 6.5 - 8.1 g/dL   Albumin 3.8 3.5 - 5.0 g/dL   AST 97 (H) 15 - 41 U/L   ALT 262 (H) 17 - 63 U/L   Alkaline Phosphatase 241 (H) 38 - 126 U/L   Total Bilirubin 4.1 (H) 0.3 - 1.2 mg/dL   GFR calc non Af Amer >60 >60 mL/min   GFR calc Af Amer >60 >60 mL/min    Comment: (NOTE) The eGFR has been calculated using the CKD EPI equation. This calculation has not been validated in all clinical situations. eGFR's persistently <60 mL/min signify possible Chronic Kidney Disease.    Anion gap 6 5 - 15  CBC     Status: Abnormal   Collection Time: 05/15/16  3:28 AM  Result Value Ref Range   WBC 15.0 (H) 4.0 - 10.5 K/uL   RBC 4.65 4.22 - 5.81 MIL/uL   Hemoglobin 14.5 13.0 - 17.0 g/dL   HCT 43.4 39.0 - 52.0 %   MCV 93.3 78.0 - 100.0 fL   MCH 31.2 26.0 - 34.0 pg   MCHC 33.4 30.0 - 36.0 g/dL   RDW 13.8 11.5 - 15.5 %   Platelets 197 150 - 400 K/uL    Radiology/Results: Mr Abdomen Mrcp Wo Cm  Result Date: 05/14/2016 CLINICAL DATA:  65 year old male with gallstone pancreatitis. History of gout. Patient presenting with abdominal pain radiating to the back. EXAM: MRI ABDOMEN WITHOUT CONTRAST  (INCLUDING MRCP) TECHNIQUE: Multiplanar multisequence MR imaging of the abdomen was performed. Heavily T2-weighted images of the biliary and pancreatic ducts were obtained, and three-dimensional MRCP images were rendered by post processing. COMPARISON:  Abdominal ultrasound dated 05/14/2016 FINDINGS: Evaluation of this exam is limited due to respiratory motion artifact. There is slight signal dropout on the out of phase images suggestive of mild underlying fatty infiltration. There is a focal area higher signal intensity in the left lobe of the liver on the out of phase images (segment IV B) compatible with fatty spurring. No focal hepatic lesions identified. There is mild biliary ductal dilatation. The gallbladder is distended. There  are multiple small stones within the gallbladder as well as within the gallbladder neck. A string of adjacent small stones noted within the central CBD at the head of the pancreas. The common bile duct measures approximately 8 mm in diameter. The pancreas is inflamed. There small amount of fluid surrounding the pancreas and extending to the lower abdomen Create there is no dilatation of the main pancreatic duct. There is no drainable fluid collection or abscess or pseudocyst. The spleen appears unremarkable. The visualized aorta and IVC appear unremarkable. There is a small ascites. IMPRESSION: Gallstone pancreatitis. Multiple small stones noted in the central CBD. No abscess or pseudocyst. Fatty liver. Electronically Signed   By: Anner Crete M.D.   On: 05/14/2016 22:10  Mr 3d Recon At Scanner  Result Date: 05/14/2016 CLINICAL DATA:  65 year old male with gallstone pancreatitis. History of gout. Patient presenting with abdominal pain radiating to the back. EXAM: MRI ABDOMEN WITHOUT CONTRAST  (INCLUDING MRCP) TECHNIQUE: Multiplanar multisequence MR imaging of the abdomen was performed. Heavily T2-weighted images of  the biliary and pancreatic ducts were obtained, and three-dimensional MRCP images were rendered by post processing. COMPARISON:  Abdominal ultrasound dated 05/14/2016 FINDINGS: Evaluation of this exam is limited due to respiratory motion artifact. There is slight signal dropout on the out of phase images suggestive of mild underlying fatty infiltration. There is a focal area higher signal intensity in the left lobe of the liver on the out of phase images (segment IV B) compatible with fatty spurring. No focal hepatic lesions identified. There is mild biliary ductal dilatation. The gallbladder is distended. There are multiple small stones within the gallbladder as well as within the gallbladder neck. A string of adjacent small stones noted within the central CBD at the head of the pancreas. The  common bile duct measures approximately 8 mm in diameter. The pancreas is inflamed. There small amount of fluid surrounding the pancreas and extending to the lower abdomen Create there is no dilatation of the main pancreatic duct. There is no drainable fluid collection or abscess or pseudocyst. The spleen appears unremarkable. The visualized aorta and IVC appear unremarkable. There is a small ascites. IMPRESSION: Gallstone pancreatitis. Multiple small stones noted in the central CBD. No abscess or pseudocyst. Fatty liver. Electronically Signed   By: Anner Crete M.D.   On: 05/14/2016 22:10  Dg Chest Port 1 View  Result Date: 05/14/2016 CLINICAL DATA:  Shortness of breath, central abdominal pain and right lower back pain since Thursday. Pain worse today. EXAM: PORTABLE CHEST 1 VIEW COMPARISON:  None. FINDINGS: Heart size is normal. Overall cardiomediastinal silhouette is within normal limits in size and configuration. Probable fibrosis and/or emphysematous change within the upper lobes bilaterally. Questionable fibrosis at each lung base. No confluent opacity to suggest a developing pneumonia. No pleural effusion or pneumothorax seen. Osseous structures about the chest are unremarkable. IMPRESSION: 1. No acute findings. No evidence of pneumonia. Heart size is normal. 2. Suspect fibrosis and/or emphysematous change within the upper lobes bilaterally. Questionable fibrosis at each lung base. Electronically Signed   By: Franki Cabot M.D.   On: 05/14/2016 19:18  US Abdomen Limited Ruq  Result Date: 05/14/2016 CLINICAL DATA:  65 year old male with severe acute right upper quadrant abdominal pain for 2 days. EXAM: US ABDOMEN LIMITED - RIGHT UPPER QUADRANT COMPARISON:  None. FINDINGS: Gallbladder: The gallbladder is distended filled with sludge and gallstones, the largest measuring 7 mm. Small amount of pericholecystic fluid is noted with equivocal sonographic Murphy's sign. Common bile duct: Diameter: 7 mm.   Mild intrahepatic biliary dilatation identified. Liver: Slight increased echogenicity noted. An ill-defined 1.5 cm hypoechoic area within the central liver is indeterminate. IMPRESSION: Distended gallbladder with sludge and cholelithiasis. Given pericholecystic fluid and equivocal sonographic Murphy's sign, this is suspicious for acute cholecystitis. Mild intrahepatic biliary dilatation. Probable hepatic steatosis. 1.5 cm hypoechoic area within the central liver is indeterminate. Consider elective MR evaluation. Electronically Signed   By: Margarette Canada M.D.   On: 05/14/2016 17:40   Anti-infectives: Anti-infectives    Start     Dose/Rate Route Frequency Ordered Stop   05/15/16 0000  piperacillin-tazobactam (ZOSYN) IVPB 3.375 g     3.375 g 12.5 mL/hr over 240 Minutes Intravenous Every 8 hours 05/14/16 1816     05/14/16 1815  piperacillin-tazobactam (ZOSYN) IVPB 3.375 g     3.375 g 100 mL/hr over 30 Minutes Intravenous  Once 05/14/16 1810 05/14/16 1915      Assessment/Plan: Problem List: Patient Active Problem List   Diagnosis Date Noted  . Gout  05/14/2016  . GERD (gastroesophageal reflux disease) 05/14/2016  . Gall stone pancreatitis 05/14/2016  . Acute cholangitis 05/14/2016  . Gallstone pancreatitis 05/14/2016    Gallstone Pancreatitis with distal common bile duct stones-symptomatically about the same;  Will order repeat lipase in the AM * No surgery found *    LOS: 1 day   Matt B. Hassell Done, MD, [facility] [phone] beeper [phone]  05/15/2016 9:20 AM

## 2016-05-15 NOTE — Consult Note (Signed)
Referring Provider: Dr. Cruzita Lederer Primary Care Physician:  No primary care provider on file. Primary Gastroenterologist:  Dr. Sherral Hammers in [place]  Reason for Consultation:  Gallstone pancreatitis; CBD stones  HPI: Gerald Odom is a 65 y.o. male with H/O gout, but otherwise healthy, who came to the ER for sudden onset right upper quadrant and epigastric abdominal pain radiating to his back, pain is constant, worse by eating food, better with rest, associated with nausea and vomiting.  Denies any fever chills, denies any similar problems before.  In the ER he had lab work suggestive of gallstone pancreatitis with possible CBD stone with elevated lipase at 2628 and elevated liver enzymes with total bili 4.9, AST 147, ALT 346, ALP 291. Right upper quadrant ultrasound was consistent with gallstones and possible acute cholecystitis along with mild intrahepatic biliary dilitation.  General surgery recommended MRCP and are on board.  MRCP showed the following:  IMPRESSION: Gallstone pancreatitis. Multiple small stones noted in the central CBD. No abscess or pseudocyst. Fatty liver.  History reviewed. No pertinent past medical history.  History reviewed. No pertinent surgical history.  Prior to Admission medications   Medication Sig Start Date End Date Taking? Authorizing Provider  allopurinol (ZYLOPRIM) 300 MG tablet Take 300 mg by mouth daily. 05/03/16  Yes Historical Provider, MD  CHANTIX 1 MG tablet Take 1 mg by mouth 2 (two) times daily. 03/28/16  Yes Historical Provider, MD  colchicine 0.6 MG tablet Take 0.6 mg by mouth daily. 03/25/16  Yes Historical Provider, MD  meloxicam (MOBIC) 15 MG tablet Take 15 mg by mouth daily. 05/14/16  Yes Historical Provider, MD  pantoprazole (PROTONIX) 40 MG tablet Take 40 mg by mouth daily. 05/14/16  Yes Historical Provider, MD    Current Facility-Administered Medications  Medication Dose Route Frequency Provider Last Rate Last Dose  . 0.9 %  sodium chloride  infusion   Intravenous Continuous Thurnell Lose, MD 125 mL/hr at 05/15/16 0159    . acetaminophen (TYLENOL) tablet 650 mg  650 mg Oral Q6H PRN Thurnell Lose, MD       Or  . acetaminophen (TYLENOL) suppository 650 mg  650 mg Rectal Q6H PRN Thurnell Lose, MD      . heparin injection 5,000 Units  5,000 Units Subcutaneous Q8H Thurnell Lose, MD   5,000 Units at 05/15/16 0529  . HYDROcodone-acetaminophen (NORCO/VICODIN) 5-325 MG per tablet 1 tablet  1 tablet Oral Q4H PRN Thurnell Lose, MD      . HYDROmorphone (DILAUDID) injection 1 mg  1 mg Intravenous Q2H PRN Thurnell Lose, MD   1 mg at 05/15/16 0536  . ondansetron (ZOFRAN) tablet 4 mg  4 mg Oral Q6H PRN Thurnell Lose, MD       Or  . ondansetron (ZOFRAN) injection 4 mg  4 mg Intravenous Q6H PRN Thurnell Lose, MD   4 mg at 05/15/16 0535  . piperacillin-tazobactam (ZOSYN) IVPB 3.375 g  3.375 g Intravenous Q8H Donald Prose Runyon, RPH 12.5 mL/hr at 05/15/16 0530 3.375 g at 05/15/16 0530  . sodium chloride 0.9 % bolus 1,000 mL  1,000 mL Intravenous PRN Thurnell Lose, MD      . sodium chloride flush (NS) 0.9 % injection 3 mL  3 mL Intravenous Q12H Thurnell Lose, MD        Allergies as of 05/14/2016  . (No Known Allergies)    No family history on file.  Social History   Social History  .  Marital status: Married    Spouse name: N/A  . Number of children: N/A  . Years of education: N/A   Occupational History  . Not on file.   Social History Main Topics  . Smoking status: Current Some Day Smoker  . Smokeless tobacco: Former Systems developer  . Alcohol use No  . Drug use: Unknown  . Sexual activity: Not on file   Other Topics Concern  . Not on file   Social History Narrative  . No narrative on file    Review of Systems: Ten point ROS is O/W negative except as mentioned in HPI.  Physical Exam: Vital signs in last 24 hours: Temp:  [97.5 F (36.4 C)-98.6 F (37 C)] 97.5 F (36.4 C) (07/30 0800) Pulse Rate:  [63-96]  96 (07/30 0700) Resp:  [12-21] 12 (07/30 0700) BP: (119-173)/(77-99) 152/96 (07/30 0700) SpO2:  [89 %-97 %] 94 % (07/30 0700) Weight:  [161 lb 6 oz (73.2 kg)-161 lb 13.1 oz (73.4 kg)] 161 lb 6 oz (73.2 kg) (07/30 0500) Last BM Date: 05/14/16 General:  Alert, Well-developed, well-nourished, pleasant and cooperative in NAD Head:  Normocephalic and atraumatic. Eyes:  Mild scleral icterus noted. Ears:  Normal auditory acuity. Mouth:  No deformity or lesions.   Lungs:  Clear throughout to auscultation.  No wheezes, crackles, or rhonchi.  Heart:  RRR.  No M/R/G. Abdomen:  Soft, non-distended.  BS present but very quiet/hypoactive.  Moderate TTP in upper abdomen. Rectal:  Deferred.  Msk:  Symmetrical without gross deformities. Pulses:  Normal pulses noted. Extremities:  Without clubbing or edema. Neurologic:  Alert and oriented x 4;  grossly normal neurologically. Skin:  Intact without significant lesions or rashes. Psych:  Alert and cooperative. Normal mood and affect.  Intake/Output from previous day: 07/29 0701 - 07/30 0700 In: -  Out: 300 [Urine:300]  Lab Results:  Recent Labs  05/14/16 1510 05/14/16 2306 05/15/16 0328  WBC 11.3* 15.1* 15.0*  HGB 16.6 15.1 14.5  HCT 47.3 43.4 43.4  PLT 233 188 197   BMET  Recent Labs  05/14/16 1512 05/14/16 2306 05/15/16 0328  NA 139  --  140  K 3.7  --  4.1  CL 107  --  110  CO2 23  --  24  GLUCOSE 128*  --  128*  BUN 11  --  13  CREATININE 0.80 0.86 0.82  CALCIUM 9.4  --  8.2*   LFT  Recent Labs  05/15/16 0328  PROT 6.4*  ALBUMIN 3.8  AST 97*  ALT 262*  ALKPHOS 241*  BILITOT 4.1*   PT/INR  Recent Labs  05/14/16 1518  LABPROT 12.9  INR 0.97   Studies/Results: Mr Abdomen Mrcp Wo Cm  Result Date: 05/14/2016 CLINICAL DATA:  65 year old male with gallstone pancreatitis. History of gout. Patient presenting with abdominal pain radiating to the back. EXAM: MRI ABDOMEN WITHOUT CONTRAST  (INCLUDING MRCP) TECHNIQUE:  Multiplanar multisequence MR imaging of the abdomen was performed. Heavily T2-weighted images of the biliary and pancreatic ducts were obtained, and three-dimensional MRCP images were rendered by post processing. COMPARISON:  Abdominal ultrasound dated 05/14/2016 FINDINGS: Evaluation of this exam is limited due to respiratory motion artifact. There is slight signal dropout on the out of phase images suggestive of mild underlying fatty infiltration. There is a focal area higher signal intensity in the left lobe of the liver on the out of phase images (segment IV B) compatible with fatty spurring. No focal hepatic lesions identified. There is mild  biliary ductal dilatation. The gallbladder is distended. There are multiple small stones within the gallbladder as well as within the gallbladder neck. A string of adjacent small stones noted within the central CBD at the head of the pancreas. The common bile duct measures approximately 8 mm in diameter. The pancreas is inflamed. There small amount of fluid surrounding the pancreas and extending to the lower abdomen Create there is no dilatation of the main pancreatic duct. There is no drainable fluid collection or abscess or pseudocyst. The spleen appears unremarkable. The visualized aorta and IVC appear unremarkable. There is a small ascites. IMPRESSION: Gallstone pancreatitis. Multiple small stones noted in the central CBD. No abscess or pseudocyst. Fatty liver. Electronically Signed   By: Anner Crete M.D.   On: 05/14/2016 22:10  Mr 3d Recon At Scanner  Result Date: 05/14/2016 CLINICAL DATA:  65 year old male with gallstone pancreatitis. History of gout. Patient presenting with abdominal pain radiating to the back. EXAM: MRI ABDOMEN WITHOUT CONTRAST  (INCLUDING MRCP) TECHNIQUE: Multiplanar multisequence MR imaging of the abdomen was performed. Heavily T2-weighted images of the biliary and pancreatic ducts were obtained, and three-dimensional MRCP images were  rendered by post processing. COMPARISON:  Abdominal ultrasound dated 05/14/2016 FINDINGS: Evaluation of this exam is limited due to respiratory motion artifact. There is slight signal dropout on the out of phase images suggestive of mild underlying fatty infiltration. There is a focal area higher signal intensity in the left lobe of the liver on the out of phase images (segment IV B) compatible with fatty spurring. No focal hepatic lesions identified. There is mild biliary ductal dilatation. The gallbladder is distended. There are multiple small stones within the gallbladder as well as within the gallbladder neck. A string of adjacent small stones noted within the central CBD at the head of the pancreas. The common bile duct measures approximately 8 mm in diameter. The pancreas is inflamed. There small amount of fluid surrounding the pancreas and extending to the lower abdomen Create there is no dilatation of the main pancreatic duct. There is no drainable fluid collection or abscess or pseudocyst. The spleen appears unremarkable. The visualized aorta and IVC appear unremarkable. There is a small ascites. IMPRESSION: Gallstone pancreatitis. Multiple small stones noted in the central CBD. No abscess or pseudocyst. Fatty liver. Electronically Signed   By: Anner Crete M.D.   On: 05/14/2016 22:10  Dg Chest Port 1 View  Result Date: 05/14/2016 CLINICAL DATA:  Shortness of breath, central abdominal pain and right lower back pain since Thursday. Pain worse today. EXAM: PORTABLE CHEST 1 VIEW COMPARISON:  None. FINDINGS: Heart size is normal. Overall cardiomediastinal silhouette is within normal limits in size and configuration. Probable fibrosis and/or emphysematous change within the upper lobes bilaterally. Questionable fibrosis at each lung base. No confluent opacity to suggest a developing pneumonia. No pleural effusion or pneumothorax seen. Osseous structures about the chest are unremarkable. IMPRESSION: 1. No  acute findings. No evidence of pneumonia. Heart size is normal. 2. Suspect fibrosis and/or emphysematous change within the upper lobes bilaterally. Questionable fibrosis at each lung base. Electronically Signed   By: Franki Cabot M.D.   On: 05/14/2016 19:18  US Abdomen Limited Ruq  Result Date: 05/14/2016 CLINICAL DATA:  65 year old male with severe acute right upper quadrant abdominal pain for 2 days. EXAM: US ABDOMEN LIMITED - RIGHT UPPER QUADRANT COMPARISON:  None. FINDINGS: Gallbladder: The gallbladder is distended filled with sludge and gallstones, the largest measuring 7 mm. Small amount of pericholecystic fluid  is noted with equivocal sonographic Murphy's sign. Common bile duct: Diameter: 7 mm.  Mild intrahepatic biliary dilatation identified. Liver: Slight increased echogenicity noted. An ill-defined 1.5 cm hypoechoic area within the central liver is indeterminate. IMPRESSION: Distended gallbladder with sludge and cholelithiasis. Given pericholecystic fluid and equivocal sonographic Murphy's sign, this is suspicious for acute cholecystitis. Mild intrahepatic biliary dilatation. Probable hepatic steatosis. 1.5 cm hypoechoic area within the central liver is indeterminate. Consider elective MR evaluation. Electronically Signed   By: Margarette Canada M.D.   On: 05/14/2016 17:40  IMPRESSION:  -Gallstone pancreatitis with CBD stones on MCRP and LFT's remaining elevated.  Leukocytosis, increasing.  Already on IV antibiotics.  PLAN: -Will need ERCP, will schedule for Dr. Loletha Carrow on 7/31. -Continue Zosyn. -Continue supportive care for pancreatitis with IVF's, pain control, anti-emetics, etc.  I will increase his IVF's to 200 mL/hour.  ZEHR, JESSICA D.  05/15/2016, 8:57 AM  Pager number [phone]

## 2016-05-15 NOTE — Progress Notes (Signed)
PROGRESS NOTE  Gerald Odom [id number] DOB: [birth year] DOA: 05/14/2016 PCP: No primary care provider on file.   LOS: 1 day   Brief Narrative: 65 y.o. male, with H/O gout otherwise healthy who came to the ER for sudden onset right upper quadrant and epigastric abdominal pain radiating to his back, pain is constant, worse by eating food better with rest, associated with nausea vomiting, denies any fever chills, denies any similar problems before. He was found to have gallstone pancreatitis and was admitted on 7/29  Assessment & Plan: Principal Problem:   Gall stone pancreatitis Active Problems:   Gout   GERD (gastroesophageal reflux disease)   Acute cholangitis   Gallstone pancreatitis   Gallstone pancreatitis with possible ascending cholangitis and acute cholecystitis - MRI confirms stones in the CBD - Gen. Surgery and gastroenterology following - Plan for ERCP on 7/31 - Continue IV Zosyn  Acute pancreatitis - Continue IV fluids, pain control  History of gout - Stable, no acute issues    DVT prophylaxis: Heparin Code Status: Full code Family Communication: Discussed with daughter at bedside Disposition Plan: Transfer to [place]  Consultants:   General surgery  Gastroenterology  Procedures:   None  Antimicrobials:  Zosyn 7/29 >>   Subjective: - Feels better this morning, no nausea or vomiting, still with mild abdominal pain in the right upper quadrant  Objective: Vitals:   05/15/16 0400 05/15/16 0500 05/15/16 0600 05/15/16 0700  BP: (!) 161/99 (!) 173/99 138/88 (!) 152/96  Pulse: 89 89 94 96  Resp: 14 14 14 12   Temp: 98.6 F (37 C)     TempSrc: Oral     SpO2: 95% 97% 94% 94%  Weight:  73.2 kg (161 lb 6 oz)    Height:        Intake/Output Summary (Last 24 hours) at 05/15/16 0729 Last data filed at 05/15/16 0300  Gross per 24 hour  Intake                0 ml  Output              300 ml  Net             -300 ml   Filed Weights   05/14/16  2250 05/15/16 0500  Weight: 73.4 kg (161 lb 13.1 oz) 73.2 kg (161 lb 6 oz)    Examination: Constitutional: NAD Vitals:   05/15/16 0400 05/15/16 0500 05/15/16 0600 05/15/16 0700  BP: (!) 161/99 (!) 173/99 138/88 (!) 152/96  Pulse: 89 89 94 96  Resp: 14 14 14 12   Temp: 98.6 F (37 C)     TempSrc: Oral     SpO2: 95% 97% 94% 94%  Weight:  73.2 kg (161 lb 6 oz)    Height:       Eyes: PERRL, lids and conjunctivae normal ENMT: Mucous membranes are moist. No oropharyngeal exudates Neck: normal, supple, no masses, no thyromegaly Respiratory: clear to auscultation bilaterally, no wheezing, no crackles. Normal respiratory effort.  Cardiovascular: Regular rate and rhythm, no murmurs / rubs / gallops. No LE edema.  Abdomen: Tender in the right upper quadrant. Bowel sounds positive.  Musculoskeletal: no clubbing / cyanosis.     Data Reviewed: I have personally reviewed following labs and imaging studies  CBC:  Recent Labs Lab 05/14/16 1510 05/14/16 2306 05/15/16 0328  WBC 11.3* 15.1* 15.0*  NEUTROABS 7.7  --   --   HGB 16.6 15.1 14.5  HCT 47.3 43.4 43.4  MCV 91.8 92.9 93.3  PLT 233 188 [id number]   Basic Metabolic Panel:  Recent Labs Lab 05/14/16 1512 05/14/16 2306 05/15/16 0328  NA 139  --  140  K 3.7  --  4.1  CL 107  --  110  CO2 23  --  24  GLUCOSE 128*  --  128*  BUN 11  --  13  CREATININE 0.80 0.86 0.82  CALCIUM 9.4  --  8.2*   GFR: Estimated Creatinine Clearance: 81.7 mL/min (by C-G formula based on SCr of 0.82 mg/dL). Liver Function Tests:  Recent Labs Lab 05/14/16 1512 05/15/16 0328  AST 147* 97*  ALT 346* 262*  ALKPHOS 291* 241*  BILITOT 4.9* 4.1*  PROT 7.6 6.4*  ALBUMIN 4.4 3.8    Recent Labs Lab 05/14/16 1512  LIPASE 2,628*   No results for input(s): AMMONIA in the last 168 hours. Coagulation Profile:  Recent Labs Lab 05/14/16 1518  INR 0.97   Cardiac Enzymes: No results for input(s): CKTOTAL, CKMB, CKMBINDEX, TROPONINI in the last 168  hours. BNP (last 3 results) No results for input(s): PROBNP in the last 8760 hours. HbA1C: No results for input(s): HGBA1C in the last 72 hours. CBG: No results for input(s): GLUCAP in the last 168 hours. Lipid Profile: No results for input(s): CHOL, HDL, LDLCALC, TRIG, CHOLHDL, LDLDIRECT in the last 72 hours. Thyroid Function Tests: No results for input(s): TSH, T4TOTAL, FREET4, T3FREE, THYROIDAB in the last 72 hours. Anemia Panel: No results for input(s): VITAMINB12, FOLATE, FERRITIN, TIBC, IRON, RETICCTPCT in the last 72 hours. Urine analysis:    Component Value Date/Time   COLORURINE ORANGE (A) 05/14/2016 1610   APPEARANCEUR CLEAR 05/14/2016 1610   LABSPEC 1.014 05/14/2016 1610   PHURINE 5.0 05/14/2016 1610   GLUCOSEU NEGATIVE 05/14/2016 1610   HGBUR NEGATIVE 05/14/2016 1610   BILIRUBINUR LARGE (A) 05/14/2016 1610   KETONESUR NEGATIVE 05/14/2016 1610   PROTEINUR NEGATIVE 05/14/2016 1610   NITRITE NEGATIVE 05/14/2016 1610   LEUKOCYTESUR TRACE (A) 05/14/2016 1610   Sepsis Labs: Invalid input(s): PROCALCITONIN, LACTICIDVEN  Recent Results (from the past 240 hour(s))  Blood culture (routine x 2)     Status: None (Preliminary result)   Collection Time: 05/14/16  6:34 PM  Result Value Ref Range Status   Specimen Description BLOOD RIGHT ANTECUBITAL  Final   Special Requests BOTTLES DRAWN AEROBIC AND ANAEROBIC 5CC  Final   Culture PENDING  Incomplete   Report Status PENDING  Incomplete  Blood culture (routine x 2)     Status: None (Preliminary result)   Collection Time: 05/14/16  6:35 PM  Result Value Ref Range Status   Specimen Description BLOOD LEFT ANTECUBITAL  Final   Special Requests BOTTLES DRAWN AEROBIC AND ANAEROBIC 5CC  Final   Culture PENDING  Incomplete   Report Status PENDING  Incomplete  MRSA PCR Screening     Status: None   Collection Time: 05/14/16 10:42 PM  Result Value Ref Range Status   MRSA by PCR NEGATIVE NEGATIVE Final    Comment:        The  GeneXpert MRSA Assay (FDA approved for NASAL specimens only), is one component of a comprehensive MRSA colonization surveillance program. It is not intended to diagnose MRSA infection nor to guide or monitor treatment for MRSA infections.       Radiology Studies: Mr Abdomen Mrcp Wo Cm  Result Date: 05/14/2016 CLINICAL DATA:  65 year old male with gallstone pancreatitis. History of gout. Patient presenting with abdominal pain radiating  to the back. EXAM: MRI ABDOMEN WITHOUT CONTRAST  (INCLUDING MRCP) TECHNIQUE: Multiplanar multisequence MR imaging of the abdomen was performed. Heavily T2-weighted images of the biliary and pancreatic ducts were obtained, and three-dimensional MRCP images were rendered by post processing. COMPARISON:  Abdominal ultrasound dated 05/14/2016 FINDINGS: Evaluation of this exam is limited due to respiratory motion artifact. There is slight signal dropout on the out of phase images suggestive of mild underlying fatty infiltration. There is a focal area higher signal intensity in the left lobe of the liver on the out of phase images (segment IV B) compatible with fatty spurring. No focal hepatic lesions identified. There is mild biliary ductal dilatation. The gallbladder is distended. There are multiple small stones within the gallbladder as well as within the gallbladder neck. A string of adjacent small stones noted within the central CBD at the head of the pancreas. The common bile duct measures approximately 8 mm in diameter. The pancreas is inflamed. There small amount of fluid surrounding the pancreas and extending to the lower abdomen Create there is no dilatation of the main pancreatic duct. There is no drainable fluid collection or abscess or pseudocyst. The spleen appears unremarkable. The visualized aorta and IVC appear unremarkable. There is a small ascites. IMPRESSION: Gallstone pancreatitis. Multiple small stones noted in the central CBD. No abscess or pseudocyst.  Fatty liver. Electronically Signed   By: Anner Crete M.D.   On: 05/14/2016 22:10  Mr 3d Recon At Scanner  Result Date: 05/14/2016 CLINICAL DATA:  65 year old male with gallstone pancreatitis. History of gout. Patient presenting with abdominal pain radiating to the back. EXAM: MRI ABDOMEN WITHOUT CONTRAST  (INCLUDING MRCP) TECHNIQUE: Multiplanar multisequence MR imaging of the abdomen was performed. Heavily T2-weighted images of the biliary and pancreatic ducts were obtained, and three-dimensional MRCP images were rendered by post processing. COMPARISON:  Abdominal ultrasound dated 05/14/2016 FINDINGS: Evaluation of this exam is limited due to respiratory motion artifact. There is slight signal dropout on the out of phase images suggestive of mild underlying fatty infiltration. There is a focal area higher signal intensity in the left lobe of the liver on the out of phase images (segment IV B) compatible with fatty spurring. No focal hepatic lesions identified. There is mild biliary ductal dilatation. The gallbladder is distended. There are multiple small stones within the gallbladder as well as within the gallbladder neck. A string of adjacent small stones noted within the central CBD at the head of the pancreas. The common bile duct measures approximately 8 mm in diameter. The pancreas is inflamed. There small amount of fluid surrounding the pancreas and extending to the lower abdomen Create there is no dilatation of the main pancreatic duct. There is no drainable fluid collection or abscess or pseudocyst. The spleen appears unremarkable. The visualized aorta and IVC appear unremarkable. There is a small ascites. IMPRESSION: Gallstone pancreatitis. Multiple small stones noted in the central CBD. No abscess or pseudocyst. Fatty liver. Electronically Signed   By: Anner Crete M.D.   On: 05/14/2016 22:10  Dg Chest Port 1 View  Result Date: 05/14/2016 CLINICAL DATA:  Shortness of breath, central  abdominal pain and right lower back pain since Thursday. Pain worse today. EXAM: PORTABLE CHEST 1 VIEW COMPARISON:  None. FINDINGS: Heart size is normal. Overall cardiomediastinal silhouette is within normal limits in size and configuration. Probable fibrosis and/or emphysematous change within the upper lobes bilaterally. Questionable fibrosis at each lung base. No confluent opacity to suggest a developing pneumonia. No pleural  effusion or pneumothorax seen. Osseous structures about the chest are unremarkable. IMPRESSION: 1. No acute findings. No evidence of pneumonia. Heart size is normal. 2. Suspect fibrosis and/or emphysematous change within the upper lobes bilaterally. Questionable fibrosis at each lung base. Electronically Signed   By: Franki Cabot M.D.   On: 05/14/2016 19:18  US Abdomen Limited Ruq  Result Date: 05/14/2016 CLINICAL DATA:  65 year old male with severe acute right upper quadrant abdominal pain for 2 days. EXAM: US ABDOMEN LIMITED - RIGHT UPPER QUADRANT COMPARISON:  None. FINDINGS: Gallbladder: The gallbladder is distended filled with sludge and gallstones, the largest measuring 7 mm. Small amount of pericholecystic fluid is noted with equivocal sonographic Murphy's sign. Common bile duct: Diameter: 7 mm.  Mild intrahepatic biliary dilatation identified. Liver: Slight increased echogenicity noted. An ill-defined 1.5 cm hypoechoic area within the central liver is indeterminate. IMPRESSION: Distended gallbladder with sludge and cholelithiasis. Given pericholecystic fluid and equivocal sonographic Murphy's sign, this is suspicious for acute cholecystitis. Mild intrahepatic biliary dilatation. Probable hepatic steatosis. 1.5 cm hypoechoic area within the central liver is indeterminate. Consider elective MR evaluation. Electronically Signed   By: Margarette Canada M.D.   On: 05/14/2016 17:40    Scheduled Meds: . heparin  5,000 Units Subcutaneous Q8H  . piperacillin-tazobactam (ZOSYN)  IV  3.375 g  Intravenous Q8H  . sodium chloride flush  3 mL Intravenous Q12H   Continuous Infusions: . sodium chloride 125 mL/hr at 05/15/16 0159     Marzetta Board, MD, PhD [facility] Pager [phone]  If 7PM-7AM, please contact night-coverage [email or web address] Password [email or web address] 05/15/2016, 7:29 AM

## 2016-05-16 ENCOUNTER — Inpatient Hospital Stay (HOSPITAL_COMMUNITY): Payer: PPO | Admitting: Certified Registered Nurse Anesthetist

## 2016-05-16 ENCOUNTER — Inpatient Hospital Stay (HOSPITAL_COMMUNITY): Payer: PPO

## 2016-05-16 ENCOUNTER — Encounter (HOSPITAL_COMMUNITY)
Admission: EM | Disposition: A | Discharge status: Home / Self Care | Payer: Self-pay | Source: Home / Self Care | Attending: Internal Medicine

## 2016-05-16 ENCOUNTER — Encounter (HOSPITAL_COMMUNITY): Payer: Self-pay

## 2016-05-16 DIAGNOSIS — J9601 Acute respiratory failure with hypoxia: Secondary | ICD-10-CM | POA: Diagnosis not present

## 2016-05-16 DIAGNOSIS — K760 Fatty (change of) liver, not elsewhere classified: Secondary | ICD-10-CM | POA: Diagnosis not present

## 2016-05-16 DIAGNOSIS — K8050 Calculus of bile duct without cholangitis or cholecystitis without obstruction: Secondary | ICD-10-CM | POA: Diagnosis not present

## 2016-05-16 DIAGNOSIS — E876 Hypokalemia: Secondary | ICD-10-CM | POA: Diagnosis not present

## 2016-05-16 DIAGNOSIS — R748 Abnormal levels of other serum enzymes: Secondary | ICD-10-CM | POA: Diagnosis not present

## 2016-05-16 DIAGNOSIS — K219 Gastro-esophageal reflux disease without esophagitis: Secondary | ICD-10-CM | POA: Diagnosis not present

## 2016-05-16 DIAGNOSIS — R17 Unspecified jaundice: Secondary | ICD-10-CM

## 2016-05-16 DIAGNOSIS — I10 Essential (primary) hypertension: Secondary | ICD-10-CM | POA: Diagnosis not present

## 2016-05-16 DIAGNOSIS — K859 Acute pancreatitis without necrosis or infection, unspecified: Secondary | ICD-10-CM | POA: Diagnosis not present

## 2016-05-16 DIAGNOSIS — T502X5A Adverse effect of carbonic-anhydrase inhibitors, benzothiadiazides and other diuretics, initial encounter: Secondary | ICD-10-CM | POA: Diagnosis not present

## 2016-05-16 DIAGNOSIS — K8062 Calculus of gallbladder and bile duct with acute cholecystitis without obstruction: Secondary | ICD-10-CM | POA: Diagnosis not present

## 2016-05-16 DIAGNOSIS — E8770 Fluid overload, unspecified: Secondary | ICD-10-CM | POA: Diagnosis not present

## 2016-05-16 DIAGNOSIS — F17200 Nicotine dependence, unspecified, uncomplicated: Secondary | ICD-10-CM | POA: Diagnosis not present

## 2016-05-16 DIAGNOSIS — K851 Biliary acute pancreatitis without necrosis or infection: Secondary | ICD-10-CM | POA: Diagnosis not present

## 2016-05-16 DIAGNOSIS — R838 Other abnormal findings in cerebrospinal fluid: Secondary | ICD-10-CM

## 2016-05-16 DIAGNOSIS — M109 Gout, unspecified: Secondary | ICD-10-CM | POA: Diagnosis not present

## 2016-05-16 HISTORY — DX: Calculus of bile duct without cholangitis or cholecystitis without obstruction: K80.50

## 2016-05-16 HISTORY — PX: ERCP: SHX5425

## 2016-05-16 LAB — LIPASE, BLOOD: LIPASE: 130 U/L — AB (ref 11–51)

## 2016-05-16 SURGERY — ERCP, WITH INTERVENTION IF INDICATED
Anesthesia: General

## 2016-05-16 MED ORDER — SODIUM CHLORIDE 0.9 % IV SOLN: INTRAVENOUS | Status: DC

## 2016-05-16 MED ORDER — FENTANYL CITRATE (PF) 100 MCG/2ML IJ SOLN
INTRAMUSCULAR | Status: DC | PRN
  Administered 2016-05-16 (×2): 50 ug via INTRAVENOUS

## 2016-05-16 MED ORDER — LACTATED RINGERS IV SOLN
INTRAVENOUS | Status: DC | PRN
  Administered 2016-05-16 (×2): via INTRAVENOUS

## 2016-05-16 MED ORDER — ONDANSETRON HCL 4 MG/2ML IJ SOLN
INTRAMUSCULAR | Status: DC | PRN
  Administered 2016-05-16: 4 mg via INTRAVENOUS

## 2016-05-16 MED ORDER — LACTATED RINGERS IV SOLN
INTRAVENOUS | Status: DC
  Administered 2016-05-16: 12:00:00 via INTRAVENOUS

## 2016-05-16 MED ORDER — PROPOFOL 10 MG/ML IV BOLUS
INTRAVENOUS | Status: AC
  Filled 2016-05-16: qty 20

## 2016-05-16 MED ORDER — IOPAMIDOL (ISOVUE-300) INJECTION 61%
INTRAVENOUS | Status: DC | PRN
  Administered 2016-05-16: 40 mL

## 2016-05-16 MED ORDER — FENTANYL CITRATE (PF) 100 MCG/2ML IJ SOLN
INTRAMUSCULAR | Status: AC
  Filled 2016-05-16: qty 2

## 2016-05-16 MED ORDER — LIDOCAINE HCL (CARDIAC) 20 MG/ML IV SOLN
INTRAVENOUS | Status: DC | PRN
  Administered 2016-05-16: 100 mg via INTRAVENOUS

## 2016-05-16 MED ORDER — SUCCINYLCHOLINE CHLORIDE 20 MG/ML IJ SOLN
INTRAMUSCULAR | Status: DC | PRN
  Administered 2016-05-16: 100 mg via INTRAVENOUS

## 2016-05-16 MED ORDER — PROPOFOL 10 MG/ML IV BOLUS
INTRAVENOUS | Status: DC | PRN
  Administered 2016-05-16: 160 mg via INTRAVENOUS

## 2016-05-16 MED ORDER — INDOMETHACIN 50 MG RE SUPP
RECTAL | Status: AC
  Filled 2016-05-16: qty 2

## 2016-05-16 MED ORDER — INDOMETHACIN 50 MG RE SUPP
100.0000 mg | Freq: Once | RECTAL | Status: AC
  Administered 2016-05-16: 100 mg via RECTAL

## 2016-05-16 MED ORDER — GLUCAGON HCL RDNA (DIAGNOSTIC) 1 MG IJ SOLR
INTRAMUSCULAR | Status: AC
  Filled 2016-05-16: qty 1

## 2016-05-16 MED ORDER — LIDOCAINE HCL (CARDIAC) 20 MG/ML IV SOLN
INTRAVENOUS | Status: AC
  Filled 2016-05-16: qty 5

## 2016-05-16 MED ORDER — INDOMETHACIN 50 MG RE SUPP
RECTAL | Status: DC | PRN
  Administered 2016-05-16: 100 mg via RECTAL

## 2016-05-16 MED ORDER — GLUCAGON HCL RDNA (DIAGNOSTIC) 1 MG IJ SOLR
INTRAMUSCULAR | Status: DC | PRN
  Administered 2016-05-16 (×2): .5 mg via INTRAVENOUS

## 2016-05-16 NOTE — Anesthesia Preprocedure Evaluation (Signed)
Anesthesia Evaluation  Patient identified by MRN, date of birth, ID band Patient awake    Reviewed: Allergy & Precautions, H&P , Patient's Chart, lab work & pertinent test results, reviewed documented beta blocker date and time   Airway Mallampati: II  TM Distance: >3 FB Neck ROM: full    Dental no notable dental hx.    Pulmonary Current Smoker,    Pulmonary exam normal breath sounds clear to auscultation       Cardiovascular  Rhythm:regular Rate:Normal     Neuro/Psych    GI/Hepatic   Endo/Other    Renal/GU      Musculoskeletal   Abdominal   Peds  Hematology   Anesthesia Other Findings   Reproductive/Obstetrics                             Anesthesia Physical Anesthesia Plan  ASA: II  Anesthesia Plan: General   Post-op Pain Management:    Induction: Intravenous  Airway Management Planned: Oral ETT  Additional Equipment:   Intra-op Plan:   Post-operative Plan: Extubation in OR  Informed Consent: I have reviewed the patients History and Physical, chart, labs and discussed the procedure including the risks, benefits and alternatives for the proposed anesthesia with the patient or authorized representative who has indicated his/her understanding and acceptance.   Dental Advisory Given and Dental advisory given  Plan Discussed with: CRNA and Surgeon  Anesthesia Plan Comments: (  Discussed general anesthesia, including possible nausea, instrumentation of airway, sore throat,pulmonary aspiration, etc. I asked if the were any outstanding questions, or  concerns before we proceeded. )        Anesthesia Quick Evaluation

## 2016-05-16 NOTE — H&P (View-Only) (Signed)
Referring Provider: Dr. Cruzita Lederer Primary Care Physician:  No primary care provider on file. Primary Gastroenterologist:  Dr. Sherral Hammers in [place]  Reason for Consultation:  Gallstone pancreatitis; CBD stones  HPI: Gerald Odom is a 65 y.o. male with H/O gout, but otherwise healthy, who came to the ER for sudden onset right upper quadrant and epigastric abdominal pain radiating to his back, pain is constant, worse by eating food, better with rest, associated with nausea and vomiting.  Denies any fever chills, denies any similar problems before.  In the ER he had lab work suggestive of gallstone pancreatitis with possible CBD stone with elevated lipase at 2628 and elevated liver enzymes with total bili 4.9, AST 147, ALT 346, ALP 291. Right upper quadrant ultrasound was consistent with gallstones and possible acute cholecystitis along with mild intrahepatic biliary dilitation.  General surgery recommended MRCP and are on board.  MRCP showed the following:  IMPRESSION: Gallstone pancreatitis. Multiple small stones noted in the central CBD. No abscess or pseudocyst. Fatty liver.  History reviewed. No pertinent past medical history.  History reviewed. No pertinent surgical history.  Prior to Admission medications   Medication Sig Start Date End Date Taking? Authorizing Provider  allopurinol (ZYLOPRIM) 300 MG tablet Take 300 mg by mouth daily. 05/03/16  Yes Historical Provider, MD  CHANTIX 1 MG tablet Take 1 mg by mouth 2 (two) times daily. 03/28/16  Yes Historical Provider, MD  colchicine 0.6 MG tablet Take 0.6 mg by mouth daily. 03/25/16  Yes Historical Provider, MD  meloxicam (MOBIC) 15 MG tablet Take 15 mg by mouth daily. 05/14/16  Yes Historical Provider, MD  pantoprazole (PROTONIX) 40 MG tablet Take 40 mg by mouth daily. 05/14/16  Yes Historical Provider, MD    Current Facility-Administered Medications  Medication Dose Route Frequency Provider Last Rate Last Dose  . 0.9 %  sodium chloride  infusion   Intravenous Continuous Thurnell Lose, MD 125 mL/hr at 05/15/16 0159    . acetaminophen (TYLENOL) tablet 650 mg  650 mg Oral Q6H PRN Thurnell Lose, MD       Or  . acetaminophen (TYLENOL) suppository 650 mg  650 mg Rectal Q6H PRN Thurnell Lose, MD      . heparin injection 5,000 Units  5,000 Units Subcutaneous Q8H Thurnell Lose, MD   5,000 Units at 05/15/16 0529  . HYDROcodone-acetaminophen (NORCO/VICODIN) 5-325 MG per tablet 1 tablet  1 tablet Oral Q4H PRN Thurnell Lose, MD      . HYDROmorphone (DILAUDID) injection 1 mg  1 mg Intravenous Q2H PRN Thurnell Lose, MD   1 mg at 05/15/16 0536  . ondansetron (ZOFRAN) tablet 4 mg  4 mg Oral Q6H PRN Thurnell Lose, MD       Or  . ondansetron (ZOFRAN) injection 4 mg  4 mg Intravenous Q6H PRN Thurnell Lose, MD   4 mg at 05/15/16 0535  . piperacillin-tazobactam (ZOSYN) IVPB 3.375 g  3.375 g Intravenous Q8H Donald Prose Runyon, RPH 12.5 mL/hr at 05/15/16 0530 3.375 g at 05/15/16 0530  . sodium chloride 0.9 % bolus 1,000 mL  1,000 mL Intravenous PRN Thurnell Lose, MD      . sodium chloride flush (NS) 0.9 % injection 3 mL  3 mL Intravenous Q12H Thurnell Lose, MD        Allergies as of 05/14/2016  . (No Known Allergies)    No family history on file.  Social History   Social History  .  Marital status: Married    Spouse name: N/A  . Number of children: N/A  . Years of education: N/A   Occupational History  . Not on file.   Social History Main Topics  . Smoking status: Current Some Day Smoker  . Smokeless tobacco: Former Systems developer  . Alcohol use No  . Drug use: Unknown  . Sexual activity: Not on file   Other Topics Concern  . Not on file   Social History Narrative  . No narrative on file    Review of Systems: Ten point ROS is O/W negative except as mentioned in HPI.  Physical Exam: Vital signs in last 24 hours: Temp:  [97.5 F (36.4 C)-98.6 F (37 C)] 97.5 F (36.4 C) (07/30 0800) Pulse Rate:  [63-96]  96 (07/30 0700) Resp:  [12-21] 12 (07/30 0700) BP: (119-173)/(77-99) 152/96 (07/30 0700) SpO2:  [89 %-97 %] 94 % (07/30 0700) Weight:  [161 lb 6 oz (73.2 kg)-161 lb 13.1 oz (73.4 kg)] 161 lb 6 oz (73.2 kg) (07/30 0500) Last BM Date: 05/14/16 General:  Alert, Well-developed, well-nourished, pleasant and cooperative in NAD Head:  Normocephalic and atraumatic. Eyes:  Mild scleral icterus noted. Ears:  Normal auditory acuity. Mouth:  No deformity or lesions.   Lungs:  Clear throughout to auscultation.  No wheezes, crackles, or rhonchi.  Heart:  RRR.  No M/R/G. Abdomen:  Soft, non-distended.  BS present but very quiet/hypoactive.  Moderate TTP in upper abdomen. Rectal:  Deferred.  Msk:  Symmetrical without gross deformities. Pulses:  Normal pulses noted. Extremities:  Without clubbing or edema. Neurologic:  Alert and oriented x 4;  grossly normal neurologically. Skin:  Intact without significant lesions or rashes. Psych:  Alert and cooperative. Normal mood and affect.  Intake/Output from previous day: 07/29 0701 - 07/30 0700 In: -  Out: 300 [Urine:300]  Lab Results:  Recent Labs  05/14/16 1510 05/14/16 2306 05/15/16 0328  WBC 11.3* 15.1* 15.0*  HGB 16.6 15.1 14.5  HCT 47.3 43.4 43.4  PLT 233 188 197   BMET  Recent Labs  05/14/16 1512 05/14/16 2306 05/15/16 0328  NA 139  --  140  K 3.7  --  4.1  CL 107  --  110  CO2 23  --  24  GLUCOSE 128*  --  128*  BUN 11  --  13  CREATININE 0.80 0.86 0.82  CALCIUM 9.4  --  8.2*   LFT  Recent Labs  05/15/16 0328  PROT 6.4*  ALBUMIN 3.8  AST 97*  ALT 262*  ALKPHOS 241*  BILITOT 4.1*   PT/INR  Recent Labs  05/14/16 1518  LABPROT 12.9  INR 0.97   Studies/Results: Mr Abdomen Mrcp Wo Cm  Result Date: 05/14/2016 CLINICAL DATA:  65 year old male with gallstone pancreatitis. History of gout. Patient presenting with abdominal pain radiating to the back. EXAM: MRI ABDOMEN WITHOUT CONTRAST  (INCLUDING MRCP) TECHNIQUE:  Multiplanar multisequence MR imaging of the abdomen was performed. Heavily T2-weighted images of the biliary and pancreatic ducts were obtained, and three-dimensional MRCP images were rendered by post processing. COMPARISON:  Abdominal ultrasound dated 05/14/2016 FINDINGS: Evaluation of this exam is limited due to respiratory motion artifact. There is slight signal dropout on the out of phase images suggestive of mild underlying fatty infiltration. There is a focal area higher signal intensity in the left lobe of the liver on the out of phase images (segment IV B) compatible with fatty spurring. No focal hepatic lesions identified. There is mild  biliary ductal dilatation. The gallbladder is distended. There are multiple small stones within the gallbladder as well as within the gallbladder neck. A string of adjacent small stones noted within the central CBD at the head of the pancreas. The common bile duct measures approximately 8 mm in diameter. The pancreas is inflamed. There small amount of fluid surrounding the pancreas and extending to the lower abdomen Create there is no dilatation of the main pancreatic duct. There is no drainable fluid collection or abscess or pseudocyst. The spleen appears unremarkable. The visualized aorta and IVC appear unremarkable. There is a small ascites. IMPRESSION: Gallstone pancreatitis. Multiple small stones noted in the central CBD. No abscess or pseudocyst. Fatty liver. Electronically Signed   By: Anner Crete M.D.   On: 05/14/2016 22:10  Mr 3d Recon At Scanner  Result Date: 05/14/2016 CLINICAL DATA:  65 year old male with gallstone pancreatitis. History of gout. Patient presenting with abdominal pain radiating to the back. EXAM: MRI ABDOMEN WITHOUT CONTRAST  (INCLUDING MRCP) TECHNIQUE: Multiplanar multisequence MR imaging of the abdomen was performed. Heavily T2-weighted images of the biliary and pancreatic ducts were obtained, and three-dimensional MRCP images were  rendered by post processing. COMPARISON:  Abdominal ultrasound dated 05/14/2016 FINDINGS: Evaluation of this exam is limited due to respiratory motion artifact. There is slight signal dropout on the out of phase images suggestive of mild underlying fatty infiltration. There is a focal area higher signal intensity in the left lobe of the liver on the out of phase images (segment IV B) compatible with fatty spurring. No focal hepatic lesions identified. There is mild biliary ductal dilatation. The gallbladder is distended. There are multiple small stones within the gallbladder as well as within the gallbladder neck. A string of adjacent small stones noted within the central CBD at the head of the pancreas. The common bile duct measures approximately 8 mm in diameter. The pancreas is inflamed. There small amount of fluid surrounding the pancreas and extending to the lower abdomen Create there is no dilatation of the main pancreatic duct. There is no drainable fluid collection or abscess or pseudocyst. The spleen appears unremarkable. The visualized aorta and IVC appear unremarkable. There is a small ascites. IMPRESSION: Gallstone pancreatitis. Multiple small stones noted in the central CBD. No abscess or pseudocyst. Fatty liver. Electronically Signed   By: Anner Crete M.D.   On: 05/14/2016 22:10  Dg Chest Port 1 View  Result Date: 05/14/2016 CLINICAL DATA:  Shortness of breath, central abdominal pain and right lower back pain since Thursday. Pain worse today. EXAM: PORTABLE CHEST 1 VIEW COMPARISON:  None. FINDINGS: Heart size is normal. Overall cardiomediastinal silhouette is within normal limits in size and configuration. Probable fibrosis and/or emphysematous change within the upper lobes bilaterally. Questionable fibrosis at each lung base. No confluent opacity to suggest a developing pneumonia. No pleural effusion or pneumothorax seen. Osseous structures about the chest are unremarkable. IMPRESSION: 1. No  acute findings. No evidence of pneumonia. Heart size is normal. 2. Suspect fibrosis and/or emphysematous change within the upper lobes bilaterally. Questionable fibrosis at each lung base. Electronically Signed   By: Franki Cabot M.D.   On: 05/14/2016 19:18  US Abdomen Limited Ruq  Result Date: 05/14/2016 CLINICAL DATA:  65 year old male with severe acute right upper quadrant abdominal pain for 2 days. EXAM: US ABDOMEN LIMITED - RIGHT UPPER QUADRANT COMPARISON:  None. FINDINGS: Gallbladder: The gallbladder is distended filled with sludge and gallstones, the largest measuring 7 mm. Small amount of pericholecystic fluid  is noted with equivocal sonographic Murphy's sign. Common bile duct: Diameter: 7 mm.  Mild intrahepatic biliary dilatation identified. Liver: Slight increased echogenicity noted. An ill-defined 1.5 cm hypoechoic area within the central liver is indeterminate. IMPRESSION: Distended gallbladder with sludge and cholelithiasis. Given pericholecystic fluid and equivocal sonographic Murphy's sign, this is suspicious for acute cholecystitis. Mild intrahepatic biliary dilatation. Probable hepatic steatosis. 1.5 cm hypoechoic area within the central liver is indeterminate. Consider elective MR evaluation. Electronically Signed   By: Margarette Canada M.D.   On: 05/14/2016 17:40  IMPRESSION:  -Gallstone pancreatitis with CBD stones on MCRP and LFT's remaining elevated.  Leukocytosis, increasing.  Already on IV antibiotics.  PLAN: -Will need ERCP, will schedule for Dr. Loletha Carrow on 7/31. -Continue Zosyn. -Continue supportive care for pancreatitis with IVF's, pain control, anti-emetics, etc.  I will increase his IVF's to 200 mL/hour.  ZEHR, JESSICA D.  05/15/2016, 8:57 AM  Pager number [phone]

## 2016-05-16 NOTE — Transfer of Care (Signed)
Immediate Anesthesia Transfer of Care Note  Patient: Gerald Odom  Procedure(s) Performed: Procedure(s): ENDOSCOPIC RETROGRADE CHOLANGIOPANCREATOGRAPHY (ERCP) (N/A)  Patient Location: PACU  Anesthesia Type:General  Level of Consciousness:  sedated, patient cooperative and responds to stimulation  Airway & Oxygen Therapy:Patient Spontanous Breathing and Patient connected to face mask oxgen  Post-op Assessment:  Report given to PACU RN and Post -op Vital signs reviewed and stable  Post vital signs:  Reviewed and stable  Last Vitals:  Vitals:   05/16/16 0429 05/16/16 1139  BP: (!) 151/91 (!) 165/82  Pulse: 79 82  Resp: 16 16  Temp: 36.8 C 123456 C    Complications: No apparent anesthesia complications

## 2016-05-16 NOTE — Progress Notes (Signed)
Patient ID: Gerald Odom, male   DOB: 03-Apr-1951, 65 y.o.   MRN: [id number]    Progress Note   Subjective   Feels about the same as on admit- ongoing epigastric pain in to back- no further vomiting   Objective   Vital signs in last 24 hours: Temp:  [97 F (36.1 C)-98.3 F (36.8 C)] 98.3 F (36.8 C) (07/31 0429) Pulse Rate:  [59-93] 79 (07/31 0429) Resp:  [16-22] 16 (07/31 0429) BP: (149-164)/(87-91) 151/91 (07/31 0429) SpO2:  [92 %-95 %] 93 % (07/31 0429) Last BM Date: 05/14/16 General:    white male in NAD, jaundiced Heart:  Regular rate and rhythm; no murmurs Lungs: Respirations even and unlabored, lungs CTA bilaterally Abdomen:  Soft, tender and nondistended. Normal bowel sounds. Extremities:  Without edema. Neurologic:  Alert and oriented,  grossly normal neurologically. Psych:  Cooperative. Normal mood and affect.  Intake/Output from previous day: No intake/output data recorded. Intake/Output this shift: No intake/output data recorded.  Lab Results:  Recent Labs  05/14/16 1510 05/14/16 2306 05/15/16 0328  WBC 11.3* 15.1* 15.0*  HGB 16.6 15.1 14.5  HCT 47.3 43.4 43.4  PLT 233 188 197   BMET  Recent Labs  05/14/16 1512 05/14/16 2306 05/15/16 0328  NA 139  --  140  K 3.7  --  4.1  CL 107  --  110  CO2 23  --  24  GLUCOSE 128*  --  128*  BUN 11  --  13  CREATININE 0.80 0.86 0.82  CALCIUM 9.4  --  8.2*   LFT  Recent Labs  05/15/16 0328  PROT 6.4*  ALBUMIN 3.8  AST 97*  ALT 262*  ALKPHOS 241*  BILITOT 4.1*   PT/INR  Recent Labs  05/14/16 1518  LABPROT 12.9  INR 0.97    Studies/Results: Mr Abdomen Mrcp Wo Cm  Result Date: 05/14/2016 CLINICAL DATA:  66 year old male with gallstone pancreatitis. History of gout. Patient presenting with abdominal pain radiating to the back. EXAM: MRI ABDOMEN WITHOUT CONTRAST  (INCLUDING MRCP) TECHNIQUE: Multiplanar multisequence MR imaging of the abdomen was performed. Heavily T2-weighted images of the  biliary and pancreatic ducts were obtained, and three-dimensional MRCP images were rendered by post processing. COMPARISON:  Abdominal ultrasound dated 05/14/2016 FINDINGS: Evaluation of this exam is limited due to respiratory motion artifact. There is slight signal dropout on the out of phase images suggestive of mild underlying fatty infiltration. There is a focal area higher signal intensity in the left lobe of the liver on the out of phase images (segment IV B) compatible with fatty spurring. No focal hepatic lesions identified. There is mild biliary ductal dilatation. The gallbladder is distended. There are multiple small stones within the gallbladder as well as within the gallbladder neck. A string of adjacent small stones noted within the central CBD at the head of the pancreas. The common bile duct measures approximately 8 mm in diameter. The pancreas is inflamed. There small amount of fluid surrounding the pancreas and extending to the lower abdomen Create there is no dilatation of the main pancreatic duct. There is no drainable fluid collection or abscess or pseudocyst. The spleen appears unremarkable. The visualized aorta and IVC appear unremarkable. There is a small ascites. IMPRESSION: Gallstone pancreatitis. Multiple small stones noted in the central CBD. No abscess or pseudocyst. Fatty liver. Electronically Signed   By: Anner Crete M.D.   On: 05/14/2016 22:10  Mr 3d Recon At Scanner  Result Date: 05/14/2016 CLINICAL DATA:  65 year old male with gallstone pancreatitis. History of gout. Patient presenting with abdominal pain radiating to the back. EXAM: MRI ABDOMEN WITHOUT CONTRAST  (INCLUDING MRCP) TECHNIQUE: Multiplanar multisequence MR imaging of the abdomen was performed. Heavily T2-weighted images of the biliary and pancreatic ducts were obtained, and three-dimensional MRCP images were rendered by post processing. COMPARISON:  Abdominal ultrasound dated 05/14/2016 FINDINGS: Evaluation of  this exam is limited due to respiratory motion artifact. There is slight signal dropout on the out of phase images suggestive of mild underlying fatty infiltration. There is a focal area higher signal intensity in the left lobe of the liver on the out of phase images (segment IV B) compatible with fatty spurring. No focal hepatic lesions identified. There is mild biliary ductal dilatation. The gallbladder is distended. There are multiple small stones within the gallbladder as well as within the gallbladder neck. A string of adjacent small stones noted within the central CBD at the head of the pancreas. The common bile duct measures approximately 8 mm in diameter. The pancreas is inflamed. There small amount of fluid surrounding the pancreas and extending to the lower abdomen Create there is no dilatation of the main pancreatic duct. There is no drainable fluid collection or abscess or pseudocyst. The spleen appears unremarkable. The visualized aorta and IVC appear unremarkable. There is a small ascites. IMPRESSION: Gallstone pancreatitis. Multiple small stones noted in the central CBD. No abscess or pseudocyst. Fatty liver. Electronically Signed   By: Anner Crete M.D.   On: 05/14/2016 22:10  Dg Chest Port 1 View  Result Date: 05/14/2016 CLINICAL DATA:  Shortness of breath, central abdominal pain and right lower back pain since Thursday. Pain worse today. EXAM: PORTABLE CHEST 1 VIEW COMPARISON:  None. FINDINGS: Heart size is normal. Overall cardiomediastinal silhouette is within normal limits in size and configuration. Probable fibrosis and/or emphysematous change within the upper lobes bilaterally. Questionable fibrosis at each lung base. No confluent opacity to suggest a developing pneumonia. No pleural effusion or pneumothorax seen. Osseous structures about the chest are unremarkable. IMPRESSION: 1. No acute findings. No evidence of pneumonia. Heart size is normal. 2. Suspect fibrosis and/or emphysematous  change within the upper lobes bilaterally. Questionable fibrosis at each lung base. Electronically Signed   By: Franki Cabot M.D.   On: 05/14/2016 19:18  US Abdomen Limited Ruq  Result Date: 05/14/2016 CLINICAL DATA:  65 year old male with severe acute right upper quadrant abdominal pain for 2 days. EXAM: US ABDOMEN LIMITED - RIGHT UPPER QUADRANT COMPARISON:  None. FINDINGS: Gallbladder: The gallbladder is distended filled with sludge and gallstones, the largest measuring 7 mm. Small amount of pericholecystic fluid is noted with equivocal sonographic Murphy's sign. Common bile duct: Diameter: 7 mm.  Mild intrahepatic biliary dilatation identified. Liver: Slight increased echogenicity noted. An ill-defined 1.5 cm hypoechoic area within the central liver is indeterminate. IMPRESSION: Distended gallbladder with sludge and cholelithiasis. Given pericholecystic fluid and equivocal sonographic Murphy's sign, this is suspicious for acute cholecystitis. Mild intrahepatic biliary dilatation. Probable hepatic steatosis. 1.5 cm hypoechoic area within the central liver is indeterminate. Consider elective MR evaluation. Electronically Signed   By: Margarette Canada M.D.   On: 05/14/2016 17:40      Assessment / Plan:    #1 65 yo male  With gallstone pancreatitis , and multiple CBD stones on MRCP Stable  No worrisome criteria He is scheduled for ERCP with stone extraction  With DrDanis  this afternoon Plan is for lap chole later this week Check labs  in am  Principal Problem:   Gall stone pancreatitis Active Problems:   Gout   GERD (gastroesophageal reflux disease)   Acute cholangitis   Gallstone pancreatitis   Choledocholithiasis     LOS: 2 days   Gerald Odom  05/16/2016, 8:49 AM

## 2016-05-16 NOTE — Anesthesia Procedure Notes (Signed)
Procedure Name: Intubation Date/Time: 05/16/2016 12:38 PM Performed by: West Pugh Pre-anesthesia Checklist: Patient identified, Emergency Drugs available, Suction available, Patient being monitored and Timeout performed Patient Re-evaluated:Patient Re-evaluated prior to inductionOxygen Delivery Method: Circle system utilized Preoxygenation: Pre-oxygenation with 100% oxygen Intubation Type: IV induction Ventilation: Mask ventilation without difficulty Laryngoscope Size: Mac and 4 Grade View: Grade I Tube type: Oral Tube size: 7.5 mm Number of attempts: 1 Airway Equipment and Method: Stylet Placement Confirmation: ETT inserted through vocal cords under direct vision,  positive ETCO2,  CO2 detector and breath sounds checked- equal and bilateral Secured at: 22 cm Tube secured with: Tape Dental Injury: Teeth and Oropharynx as per pre-operative assessment

## 2016-05-16 NOTE — Op Note (Signed)
[facility] Patient Name: Gerald Odom Procedure Date: 05/16/2016 MRN: [id number] Attending MD: Estill Cotta. Loletha Carrow , MD Date of Birth: [birth year] CSN: [id number] Age: 64 Admit Type: Inpatient Procedure:                ERCP Indications:              Bile duct stone(s), Jaundice, Elevated liver enzymes Providers:                Mallie Mussel L. Loletha Carrow, MD, Cleda Daub, RN, Corliss Parish, Technician Referring MD:              Medicines:                General Anesthesia, Indomethacin 100 mg PR,                            scheduled zosyn Complications:            No immediate complications. Estimated Blood Loss:     Estimated blood loss: none. Procedure:                Pre-Anesthesia Assessment:                           - Prior to the procedure, a History and Physical                            was performed, and patient medications and                            allergies were reviewed. The patient's tolerance of                            previous anesthesia was also reviewed. The risks                            and benefits of the procedure and the sedation                            options and risks were discussed with the patient.                            All questions were answered, and informed consent                            was obtained. Prior Anticoagulants: The patient has                            taken heparin, last dose was 1 day prior to                            procedure. ASA Grade Assessment: II - A patient  with mild systemic disease. After reviewing the                            risks and benefits, the patient was deemed in                            satisfactory condition to undergo the procedure.                           After obtaining informed consent, the scope was                            passed under direct vision. Throughout the                            procedure, the patient's blood  pressure, pulse, and                            oxygen saturations were monitored continuously. The                            [id number]) scope was introduced through                            the mouth, and used to inject contrast into and                            used to inject contrast into the bile duct. The                            ERCP was accomplished without difficulty. The                            patient tolerated the procedure well. Scope In: Scope Out: Findings:      The scout film was normal. The esophagus was successfully intubated       under direct vision. The scope was advanced to a normal major papilla in       the descending duodenum without detailed examination of the pharynx,       larynx and associated structures, and upper GI tract. The upper GI tract       was grossly normal. The bile duct was deeply cannulated with the       traction (standard) sphincterotome. Contrast was injected. I personally       interpreted the bile duct images. There was brisk flow of contrast       through the ducts. Image quality was adequate. Contrast extended to the       hepatic ducts. The common bile duct contained multiple stones. The       common bile duct was diffusely dilated. The largest diameter was 12 mm.       The biliary tree was otherwise normal. 0.035 inch x 260 cm straight       Hydra Jagwire passed successfully into the right main hepatic duct. An 8       mm biliary sphincterotomy was made with a traction (  standard)       sphincterotome using blended current. There was no post-sphincterotomy       bleeding. The biliary tree was swept with a 15 mm balloon starting at       the bifurcation. All stones were removed with three passes. 2 additional       passes were made to be certain there were no retained stones, and a good       occlusion cholangiogram was obtained. Impression:               - The common bile duct was dilated.                           -  Choledocholithiasis was found. Complete removal                            was accomplished by biliary sphincterotomy and                            balloon extraction.                           - A biliary sphincterotomy was performed.                           - The biliary tree was swept. Moderate Sedation:      GETA Recommendation:           - SQ heparin was discontinued and SCDs ordered for                            DVT prophylaxis.                           NPO except ice chips until pancreatitis resolves. Procedure Code(s):        --- Professional ---                           [phone], Endoscopic retrograde                            cholangiopancreatography (ERCP); with removal of                            calculi/debris from biliary/pancreatic duct(s)                           43262, Endoscopic retrograde                            cholangiopancreatography (ERCP); with                            sphincterotomy/papillotomy Diagnosis Code(s):        --- Professional ---                           K80.50, Calculus of bile duct without cholangitis  or cholecystitis without obstruction                           R17, Unspecified jaundice                           R74.8, Abnormal levels of other serum enzymes                           K83.8, Other specified diseases of biliary tract CPT copyright 2016 [employer]. All rights reserved. The codes documented in this report are preliminary and upon coder review may  be revised to meet current compliance requirements. Calina Patrie L. Loletha Carrow, MD 05/16/2016 1:56:48 PM This report has been signed electronically. Number of Addenda: 0

## 2016-05-16 NOTE — Interval H&P Note (Signed)
History and Physical Interval Note:  05/16/2016 12:18 PM  Gerald Odom  has presented today for surgery, with the diagnosis of CBD stones, gallstone pancreatitis  The various methods of treatment have been discussed with the patient and family. After consideration of risks, benefits and other options for treatment, the patient has consented to  Procedure(s): ENDOSCOPIC RETROGRADE CHOLANGIOPANCREATOGRAPHY (ERCP) (N/A) as a surgical intervention .  The patient's history has been reviewed, patient examined, no change in status, stable for surgery.  I have reviewed the patient's chart and labs.  Questions were answered to the patient's satisfaction.     Nelida Meuse III

## 2016-05-16 NOTE — Progress Notes (Signed)
PROGRESS NOTE  Gerald Odom [id number] DOB: August 23, 1951 DOA: 05/14/2016 PCP: No primary care provider on file.   LOS: 2 days   Brief Narrative: 65 y.o. male, with H/O gout otherwise healthy who came to the ER for sudden onset right upper quadrant and epigastric abdominal pain radiating to his back, pain is constant, worse by eating food better with rest, associated with nausea vomiting, denies any fever chills, denies any similar problems before. He was found to have gallstone pancreatitis and was admitted on 7/29  Assessment & Plan: Principal Problem:   Gall stone pancreatitis Active Problems:   Gout   GERD (gastroesophageal reflux disease)   Acute cholangitis   Gallstone pancreatitis   Choledocholithiasis   Gallstone pancreatitis with possible ascending cholangitis and acute cholecystitis - MRI confirms stones in the CBD - Gen. Surgery and gastroenterology following - Plan for ERCP today, followed by cholecystectomy per general surgery  - Continue IV Zosyn  Acute pancreatitis - Continue IV fluids, pain control  History of gout - Stable, no acute issues    DVT prophylaxis: Heparin Code Status: Full code Family Communication: Discussed with daughter at bedside Disposition Plan: Transfer to [place]  Consultants:   General surgery  Gastroenterology  Procedures:   None  Antimicrobials:  Zosyn 7/29 >>   Subjective: - Feels better this morning, no nausea or vomiting, still with mild abdominal pain in the right upper quadrant  Objective: Vitals:   05/15/16 1420 05/15/16 2028 05/16/16 0429 05/16/16 1139  BP: (!) 149/88 (!) 158/87 (!) 151/91 (!) 165/82  Pulse: 91 (!) 59 79 82  Resp: 20 20 16 16   Temp: 98.3 F (36.8 C) 97 F (36.1 C) 98.3 F (36.8 C) 98.4 F (36.9 C)  TempSrc: Oral Oral Oral Oral  SpO2: 93% 92% 93% 91%  Weight:    72.6 kg (160 lb)  Height:    5\' 7"  (1.702 m)    Intake/Output Summary (Last 24 hours) at 05/16/16 1220 Last data filed  at 05/16/16 0931  Gross per 24 hour  Intake                0 ml  Output                0 ml  Net                0 ml   Filed Weights   05/14/16 2250 05/15/16 0500 05/16/16 1139  Weight: 73.4 kg (161 lb 13.1 oz) 73.2 kg (161 lb 6 oz) 72.6 kg (160 lb)    Examination: Constitutional: NAD Vitals:   05/15/16 1420 05/15/16 2028 05/16/16 0429 05/16/16 1139  BP: (!) 149/88 (!) 158/87 (!) 151/91 (!) 165/82  Pulse: 91 (!) 59 79 82  Resp: 20 20 16 16   Temp: 98.3 F (36.8 C) 97 F (36.1 C) 98.3 F (36.8 C) 98.4 F (36.9 C)  TempSrc: Oral Oral Oral Oral  SpO2: 93% 92% 93% 91%  Weight:    72.6 kg (160 lb)  Height:    5\' 7"  (1.702 m)   Eyes: PERRL, lids and conjunctivae normal Respiratory: clear to auscultation bilaterally, no wheezing, no crackles. Normal respiratory effort.  Cardiovascular: Regular rate and rhythm, no murmurs / rubs / gallops. No LE edema.  Abdomen: Tender in the right upper quadrant. Bowel sounds positive.     Data Reviewed: I have personally reviewed following labs and imaging studies  CBC:  Recent Labs Lab 05/14/16 1510 05/14/16 2306 05/15/16 0328  WBC  11.3* 15.1* 15.0*  NEUTROABS 7.7  --   --   HGB 16.6 15.1 14.5  HCT 47.3 43.4 43.4  MCV 91.8 92.9 93.3  PLT 233 188 [id number]   Basic Metabolic Panel:  Recent Labs Lab 05/14/16 1512 05/14/16 2306 05/15/16 0328  NA 139  --  140  K 3.7  --  4.1  CL 107  --  110  CO2 23  --  24  GLUCOSE 128*  --  128*  BUN 11  --  13  CREATININE 0.80 0.86 0.82  CALCIUM 9.4  --  8.2*   GFR: Estimated Creatinine Clearance: 81.7 mL/min (by C-G formula based on SCr of 0.82 mg/dL). Liver Function Tests:  Recent Labs Lab 05/14/16 1512 05/15/16 0328  AST 147* 97*  ALT 346* 262*  ALKPHOS 291* 241*  BILITOT 4.9* 4.1*  PROT 7.6 6.4*  ALBUMIN 4.4 3.8    Recent Labs Lab 05/14/16 1512 05/16/16 0409  LIPASE 2,628* 130*   No results for input(s): AMMONIA in the last 168 hours. Coagulation Profile:  Recent  Labs Lab 05/14/16 1518  INR 0.97   Cardiac Enzymes: No results for input(s): CKTOTAL, CKMB, CKMBINDEX, TROPONINI in the last 168 hours. BNP (last 3 results) No results for input(s): PROBNP in the last 8760 hours. HbA1C: No results for input(s): HGBA1C in the last 72 hours. CBG: No results for input(s): GLUCAP in the last 168 hours. Lipid Profile: No results for input(s): CHOL, HDL, LDLCALC, TRIG, CHOLHDL, LDLDIRECT in the last 72 hours. Thyroid Function Tests: No results for input(s): TSH, T4TOTAL, FREET4, T3FREE, THYROIDAB in the last 72 hours. Anemia Panel: No results for input(s): VITAMINB12, FOLATE, FERRITIN, TIBC, IRON, RETICCTPCT in the last 72 hours. Urine analysis:    Component Value Date/Time   COLORURINE ORANGE (A) 05/14/2016 1610   APPEARANCEUR CLEAR 05/14/2016 1610   LABSPEC 1.014 05/14/2016 1610   PHURINE 5.0 05/14/2016 1610   GLUCOSEU NEGATIVE 05/14/2016 1610   HGBUR NEGATIVE 05/14/2016 1610   BILIRUBINUR LARGE (A) 05/14/2016 1610   KETONESUR NEGATIVE 05/14/2016 1610   PROTEINUR NEGATIVE 05/14/2016 1610   NITRITE NEGATIVE 05/14/2016 1610   LEUKOCYTESUR TRACE (A) 05/14/2016 1610   Sepsis Labs: Invalid input(s): PROCALCITONIN, LACTICIDVEN  Recent Results (from the past 240 hour(s))  Blood culture (routine x 2)     Status: None (Preliminary result)   Collection Time: 05/14/16  6:34 PM  Result Value Ref Range Status   Specimen Description BLOOD RIGHT ANTECUBITAL  Final   Special Requests BOTTLES DRAWN AEROBIC AND ANAEROBIC 5CC  Final   Culture PENDING  Incomplete   Report Status PENDING  Incomplete  Blood culture (routine x 2)     Status: None (Preliminary result)   Collection Time: 05/14/16  6:35 PM  Result Value Ref Range Status   Specimen Description BLOOD LEFT ANTECUBITAL  Final   Special Requests BOTTLES DRAWN AEROBIC AND ANAEROBIC 5CC  Final   Culture PENDING  Incomplete   Report Status PENDING  Incomplete  MRSA PCR Screening     Status: None    Collection Time: 05/14/16 10:42 PM  Result Value Ref Range Status   MRSA by PCR NEGATIVE NEGATIVE Final    Comment:        The GeneXpert MRSA Assay (FDA approved for NASAL specimens only), is one component of a comprehensive MRSA colonization surveillance program. It is not intended to diagnose MRSA infection nor to guide or monitor treatment for MRSA infections.       Radiology Studies:  Mr Abdomen Mrcp Wo Cm  Result Date: 05/14/2016 CLINICAL DATA:  65 year old male with gallstone pancreatitis. History of gout. Patient presenting with abdominal pain radiating to the back. EXAM: MRI ABDOMEN WITHOUT CONTRAST  (INCLUDING MRCP) TECHNIQUE: Multiplanar multisequence MR imaging of the abdomen was performed. Heavily T2-weighted images of the biliary and pancreatic ducts were obtained, and three-dimensional MRCP images were rendered by post processing. COMPARISON:  Abdominal ultrasound dated 05/14/2016 FINDINGS: Evaluation of this exam is limited due to respiratory motion artifact. There is slight signal dropout on the out of phase images suggestive of mild underlying fatty infiltration. There is a focal area higher signal intensity in the left lobe of the liver on the out of phase images (segment IV B) compatible with fatty spurring. No focal hepatic lesions identified. There is mild biliary ductal dilatation. The gallbladder is distended. There are multiple small stones within the gallbladder as well as within the gallbladder neck. A string of adjacent small stones noted within the central CBD at the head of the pancreas. The common bile duct measures approximately 8 mm in diameter. The pancreas is inflamed. There small amount of fluid surrounding the pancreas and extending to the lower abdomen Create there is no dilatation of the main pancreatic duct. There is no drainable fluid collection or abscess or pseudocyst. The spleen appears unremarkable. The visualized aorta and IVC appear unremarkable. There  is a small ascites. IMPRESSION: Gallstone pancreatitis. Multiple small stones noted in the central CBD. No abscess or pseudocyst. Fatty liver. Electronically Signed   By: Anner Crete M.D.   On: 05/14/2016 22:10  Mr 3d Recon At Scanner  Result Date: 05/14/2016 CLINICAL DATA:  65 year old male with gallstone pancreatitis. History of gout. Patient presenting with abdominal pain radiating to the back. EXAM: MRI ABDOMEN WITHOUT CONTRAST  (INCLUDING MRCP) TECHNIQUE: Multiplanar multisequence MR imaging of the abdomen was performed. Heavily T2-weighted images of the biliary and pancreatic ducts were obtained, and three-dimensional MRCP images were rendered by post processing. COMPARISON:  Abdominal ultrasound dated 05/14/2016 FINDINGS: Evaluation of this exam is limited due to respiratory motion artifact. There is slight signal dropout on the out of phase images suggestive of mild underlying fatty infiltration. There is a focal area higher signal intensity in the left lobe of the liver on the out of phase images (segment IV B) compatible with fatty spurring. No focal hepatic lesions identified. There is mild biliary ductal dilatation. The gallbladder is distended. There are multiple small stones within the gallbladder as well as within the gallbladder neck. A string of adjacent small stones noted within the central CBD at the head of the pancreas. The common bile duct measures approximately 8 mm in diameter. The pancreas is inflamed. There small amount of fluid surrounding the pancreas and extending to the lower abdomen Create there is no dilatation of the main pancreatic duct. There is no drainable fluid collection or abscess or pseudocyst. The spleen appears unremarkable. The visualized aorta and IVC appear unremarkable. There is a small ascites. IMPRESSION: Gallstone pancreatitis. Multiple small stones noted in the central CBD. No abscess or pseudocyst. Fatty liver. Electronically Signed   By: Anner Crete  M.D.   On: 05/14/2016 22:10  Dg Chest Port 1 View  Result Date: 05/14/2016 CLINICAL DATA:  Shortness of breath, central abdominal pain and right lower back pain since Thursday. Pain worse today. EXAM: PORTABLE CHEST 1 VIEW COMPARISON:  None. FINDINGS: Heart size is normal. Overall cardiomediastinal silhouette is within normal limits in size and configuration.  Probable fibrosis and/or emphysematous change within the upper lobes bilaterally. Questionable fibrosis at each lung base. No confluent opacity to suggest a developing pneumonia. No pleural effusion or pneumothorax seen. Osseous structures about the chest are unremarkable. IMPRESSION: 1. No acute findings. No evidence of pneumonia. Heart size is normal. 2. Suspect fibrosis and/or emphysematous change within the upper lobes bilaterally. Questionable fibrosis at each lung base. Electronically Signed   By: Franki Cabot M.D.   On: 05/14/2016 19:18  US Abdomen Limited Ruq  Result Date: 05/14/2016 CLINICAL DATA:  65 year old male with severe acute right upper quadrant abdominal pain for 2 days. EXAM: US ABDOMEN LIMITED - RIGHT UPPER QUADRANT COMPARISON:  None. FINDINGS: Gallbladder: The gallbladder is distended filled with sludge and gallstones, the largest measuring 7 mm. Small amount of pericholecystic fluid is noted with equivocal sonographic Murphy's sign. Common bile duct: Diameter: 7 mm.  Mild intrahepatic biliary dilatation identified. Liver: Slight increased echogenicity noted. An ill-defined 1.5 cm hypoechoic area within the central liver is indeterminate. IMPRESSION: Distended gallbladder with sludge and cholelithiasis. Given pericholecystic fluid and equivocal sonographic Murphy's sign, this is suspicious for acute cholecystitis. Mild intrahepatic biliary dilatation. Probable hepatic steatosis. 1.5 cm hypoechoic area within the central liver is indeterminate. Consider elective MR evaluation. Electronically Signed   By: Margarette Canada M.D.   On:  05/14/2016 17:40    Scheduled Meds: . [MAR Hold] heparin  5,000 Units Subcutaneous Q8H  . indomethacin  100 mg Rectal Once  . [MAR Hold] piperacillin-tazobactam (ZOSYN)  IV  3.375 g Intravenous Q8H  . [MAR Hold] sodium chloride flush  3 mL Intravenous Q12H   Continuous Infusions: . sodium chloride    . sodium chloride 150 mL/hr at 05/16/16 1102  . lactated ringers 20 mL/hr at 05/16/16 [place], MD, PhD [facility] Pager [phone]  If 7PM-7AM, please contact night-coverage [email or web address] Password [email or web address] 05/16/2016, 12:20 PM

## 2016-05-16 NOTE — Progress Notes (Signed)
Subjective: Still having the same pain.  Objective: Vital signs in last 24 hours: Temp:  [97 F (36.1 C)-98.3 F (36.8 C)] 98.3 F (36.8 C) (07/31 0429) Pulse Rate:  [59-93] 79 (07/31 0429) Resp:  [16-22] 16 (07/31 0429) BP: (149-164)/(87-91) 151/91 (07/31 0429) SpO2:  [92 %-95 %] 93 % (07/31 0429) Last BM Date: 05/14/16  Intake/Output from previous day: No intake/output data recorded. Intake/Output this shift: No intake/output data recorded.  PE: General- In NAD Abdomen-soft, epigastric and RUQ tenderness  Lab Results:   Recent Labs  05/14/16 2306 05/15/16 0328  WBC 15.1* 15.0*  HGB 15.1 14.5  HCT 43.4 43.4  PLT 188 197   BMET  Recent Labs  05/14/16 1512 05/14/16 2306 05/15/16 0328  NA 139  --  140  K 3.7  --  4.1  CL 107  --  110  CO2 23  --  24  GLUCOSE 128*  --  128*  BUN 11  --  13  CREATININE 0.80 0.86 0.82  CALCIUM 9.4  --  8.2*   PT/INR  Recent Labs  05/14/16 1518  LABPROT 12.9  INR 0.97   Comprehensive Metabolic Panel:    Component Value Date/Time   NA 140 05/15/2016 0328   NA 139 05/14/2016 1512   K 4.1 05/15/2016 0328   K 3.7 05/14/2016 1512   CL 110 05/15/2016 0328   CL 107 05/14/2016 1512   CO2 24 05/15/2016 0328   CO2 23 05/14/2016 1512   BUN 13 05/15/2016 0328   BUN 11 05/14/2016 1512   CREATININE 0.82 05/15/2016 0328   CREATININE 0.86 05/14/2016 2306   GLUCOSE 128 (H) 05/15/2016 0328   GLUCOSE 128 (H) 05/14/2016 1512   CALCIUM 8.2 (L) 05/15/2016 0328   CALCIUM 9.4 05/14/2016 1512   AST 97 (H) 05/15/2016 0328   AST 147 (H) 05/14/2016 1512   ALT 262 (H) 05/15/2016 0328   ALT 346 (H) 05/14/2016 1512   ALKPHOS 241 (H) 05/15/2016 0328   ALKPHOS 291 (H) 05/14/2016 1512   BILITOT 4.1 (H) 05/15/2016 0328   BILITOT 4.9 (H) 05/14/2016 1512   PROT 6.4 (L) 05/15/2016 0328   PROT 7.6 05/14/2016 1512   ALBUMIN 3.8 05/15/2016 0328   ALBUMIN 4.4 05/14/2016 1512     Studies/Results: Mr Abdomen Mrcp Wo Cm  Result Date:  05/14/2016 CLINICAL DATA:  65 year old male with gallstone pancreatitis. History of gout. Patient presenting with abdominal pain radiating to the back. EXAM: MRI ABDOMEN WITHOUT CONTRAST  (INCLUDING MRCP) TECHNIQUE: Multiplanar multisequence MR imaging of the abdomen was performed. Heavily T2-weighted images of the biliary and pancreatic ducts were obtained, and three-dimensional MRCP images were rendered by post processing. COMPARISON:  Abdominal ultrasound dated 05/14/2016 FINDINGS: Evaluation of this exam is limited due to respiratory motion artifact. There is slight signal dropout on the out of phase images suggestive of mild underlying fatty infiltration. There is a focal area higher signal intensity in the left lobe of the liver on the out of phase images (segment IV B) compatible with fatty spurring. No focal hepatic lesions identified. There is mild biliary ductal dilatation. The gallbladder is distended. There are multiple small stones within the gallbladder as well as within the gallbladder neck. A string of adjacent small stones noted within the central CBD at the head of the pancreas. The common bile duct measures approximately 8 mm in diameter. The pancreas is inflamed. There small amount of fluid surrounding the pancreas and extending to the lower abdomen Create there  is no dilatation of the main pancreatic duct. There is no drainable fluid collection or abscess or pseudocyst. The spleen appears unremarkable. The visualized aorta and IVC appear unremarkable. There is a small ascites. IMPRESSION: Gallstone pancreatitis. Multiple small stones noted in the central CBD. No abscess or pseudocyst. Fatty liver. Electronically Signed   By: Anner Crete M.D.   On: 05/14/2016 22:10  Mr 3d Recon At Scanner  Result Date: 05/14/2016 CLINICAL DATA:  65 year old male with gallstone pancreatitis. History of gout. Patient presenting with abdominal pain radiating to the back. EXAM: MRI ABDOMEN WITHOUT CONTRAST   (INCLUDING MRCP) TECHNIQUE: Multiplanar multisequence MR imaging of the abdomen was performed. Heavily T2-weighted images of the biliary and pancreatic ducts were obtained, and three-dimensional MRCP images were rendered by post processing. COMPARISON:  Abdominal ultrasound dated 05/14/2016 FINDINGS: Evaluation of this exam is limited due to respiratory motion artifact. There is slight signal dropout on the out of phase images suggestive of mild underlying fatty infiltration. There is a focal area higher signal intensity in the left lobe of the liver on the out of phase images (segment IV B) compatible with fatty spurring. No focal hepatic lesions identified. There is mild biliary ductal dilatation. The gallbladder is distended. There are multiple small stones within the gallbladder as well as within the gallbladder neck. A string of adjacent small stones noted within the central CBD at the head of the pancreas. The common bile duct measures approximately 8 mm in diameter. The pancreas is inflamed. There small amount of fluid surrounding the pancreas and extending to the lower abdomen Create there is no dilatation of the main pancreatic duct. There is no drainable fluid collection or abscess or pseudocyst. The spleen appears unremarkable. The visualized aorta and IVC appear unremarkable. There is a small ascites. IMPRESSION: Gallstone pancreatitis. Multiple small stones noted in the central CBD. No abscess or pseudocyst. Fatty liver. Electronically Signed   By: Anner Crete M.D.   On: 05/14/2016 22:10  Dg Chest Port 1 View  Result Date: 05/14/2016 CLINICAL DATA:  Shortness of breath, central abdominal pain and right lower back pain since Thursday. Pain worse today. EXAM: PORTABLE CHEST 1 VIEW COMPARISON:  None. FINDINGS: Heart size is normal. Overall cardiomediastinal silhouette is within normal limits in size and configuration. Probable fibrosis and/or emphysematous change within the upper lobes  bilaterally. Questionable fibrosis at each lung base. No confluent opacity to suggest a developing pneumonia. No pleural effusion or pneumothorax seen. Osseous structures about the chest are unremarkable. IMPRESSION: 1. No acute findings. No evidence of pneumonia. Heart size is normal. 2. Suspect fibrosis and/or emphysematous change within the upper lobes bilaterally. Questionable fibrosis at each lung base. Electronically Signed   By: Franki Cabot M.D.   On: 05/14/2016 19:18  US Abdomen Limited Ruq  Result Date: 05/14/2016 CLINICAL DATA:  65 year old male with severe acute right upper quadrant abdominal pain for 2 days. EXAM: US ABDOMEN LIMITED - RIGHT UPPER QUADRANT COMPARISON:  None. FINDINGS: Gallbladder: The gallbladder is distended filled with sludge and gallstones, the largest measuring 7 mm. Small amount of pericholecystic fluid is noted with equivocal sonographic Murphy's sign. Common bile duct: Diameter: 7 mm.  Mild intrahepatic biliary dilatation identified. Liver: Slight increased echogenicity noted. An ill-defined 1.5 cm hypoechoic area within the central liver is indeterminate. IMPRESSION: Distended gallbladder with sludge and cholelithiasis. Given pericholecystic fluid and equivocal sonographic Murphy's sign, this is suspicious for acute cholecystitis. Mild intrahepatic biliary dilatation. Probable hepatic steatosis. 1.5 cm hypoechoic area within  the central liver is indeterminate. Consider elective MR evaluation. Electronically Signed   By: Margarette Canada M.D.   On: 05/14/2016 17:40   Anti-infectives: Anti-infectives    Start     Dose/Rate Route Frequency Ordered Stop   05/15/16 0000  piperacillin-tazobactam (ZOSYN) IVPB 3.375 g     3.375 g 12.5 mL/hr over 240 Minutes Intravenous Every 8 hours 05/14/16 1816     05/14/16 1815  piperacillin-tazobactam (ZOSYN) IVPB 3.375 g     3.375 g 100 mL/hr over 30 Minutes Intravenous  Once 05/14/16 1810 05/14/16 1915      Assessment   Gallstone  pancreatitis-still tender in epigastrium; lipase trending down    Choledocholithiasis   LOS: 2 days   Plan: ERCP today. Cholecystectomy later this admission .  I have explained the procedure, risks, and aftercare of cholecystectomy.  Risks include but are not limited to bleeding, infection, wound problems, anesthesia, diarrhea, bile leak, injury to common bile duct/liver/intestine.  He seems to understand and agrees with the plan.    Aloura Matsuoka J 05/16/2016

## 2016-05-17 ENCOUNTER — Encounter (HOSPITAL_COMMUNITY): Payer: Self-pay | Admitting: Gastroenterology

## 2016-05-17 ENCOUNTER — Inpatient Hospital Stay (HOSPITAL_COMMUNITY): Payer: PPO

## 2016-05-17 DIAGNOSIS — J189 Pneumonia, unspecified organism: Secondary | ICD-10-CM

## 2016-05-17 DIAGNOSIS — K851 Biliary acute pancreatitis without necrosis or infection: Secondary | ICD-10-CM | POA: Diagnosis not present

## 2016-05-17 DIAGNOSIS — J449 Chronic obstructive pulmonary disease, unspecified: Secondary | ICD-10-CM | POA: Diagnosis not present

## 2016-05-17 DIAGNOSIS — K859 Acute pancreatitis without necrosis or infection, unspecified: Secondary | ICD-10-CM

## 2016-05-17 HISTORY — DX: Pneumonia, unspecified organism: J18.9

## 2016-05-17 HISTORY — DX: Acute pancreatitis without necrosis or infection, unspecified: K85.90

## 2016-05-17 LAB — COMPREHENSIVE METABOLIC PANEL
ALK PHOS: 198 U/L — AB (ref 38–126)
ALT: 151 U/L — ABNORMAL HIGH (ref 17–63)
ANION GAP: 5 (ref 5–15)
AST: 62 U/L — AB (ref 15–41)
Albumin: 2.9 g/dL — ABNORMAL LOW (ref 3.5–5.0)
BILIRUBIN TOTAL: 4 mg/dL — AB (ref 0.3–1.2)
BUN: 12 mg/dL (ref 6–20)
CALCIUM: 7.9 mg/dL — AB (ref 8.9–10.3)
CO2: 26 mmol/L (ref 22–32)
Chloride: 107 mmol/L (ref 101–111)
Creatinine, Ser: 0.58 mg/dL — ABNORMAL LOW (ref 0.61–1.24)
GFR calc Af Amer: >60 mL/min (ref >60)
Glucose, Bld: 94 mg/dL (ref 65–99)
POTASSIUM: 3.6 mmol/L (ref 3.5–5.1)
Sodium: 138 mmol/L (ref 135–145)
TOTAL PROTEIN: 5.6 g/dL — AB (ref 6.5–8.1)

## 2016-05-17 LAB — CBC
HCT: 35.2 % — ABNORMAL LOW (ref 39.0–52.0)
HEMOGLOBIN: 11.9 g/dL — AB (ref 13.0–17.0)
MCH: 32.1 pg (ref 26.0–34.0)
MCHC: 33.8 g/dL (ref 30.0–36.0)
MCV: 94.9 fL (ref 78.0–100.0)
PLATELETS: 165 10*3/uL (ref 150–400)
RBC: 3.71 MIL/uL — ABNORMAL LOW (ref 4.22–5.81)
RDW: 14.1 % (ref 11.5–15.5)
WBC: 13.4 10*3/uL — ABNORMAL HIGH (ref 4.0–10.5)

## 2016-05-17 LAB — LIPASE, BLOOD: LIPASE: 42 U/L (ref 11–51)

## 2016-05-17 MED ORDER — FUROSEMIDE 10 MG/ML IJ SOLN
20.0000 mg | Freq: Once | INTRAMUSCULAR | Status: AC
  Administered 2016-05-17: 20 mg via INTRAVENOUS
  Filled 2016-05-17: qty 2

## 2016-05-17 NOTE — Progress Notes (Signed)
1 Day Post-Op  Subjective: Still having a moderate amount of pain  Objective: Vital signs in last 24 hours: Temp:  [98 F (36.7 C)-98.7 F (37.1 C)] 98.7 F (37.1 C) (08/01 0601) Pulse Rate:  [72-103] 85 (08/01 0601) Resp:  [14-17] 17 (08/01 0601) BP: (136-169)/(69-93) 158/83 (08/01 0601) SpO2:  [86 %-96 %] 91 % (08/01 0601) Weight:  [72.6 kg (160 lb)-79.4 kg (175 lb)] 79.4 kg (175 lb) (08/01 0601) Last BM Date: 05/11/16  Intake/Output from previous day: 07/31 0701 - 08/01 0700 In: 1500 [I.V.:1500] Out: -  Intake/Output this shift: No intake/output data recorded.  PE: General- In NAD Abdomen-soft, epigastric and RUQ tenderness remains  Lab Results:   Recent Labs  05/15/16 0328 05/17/16 0543  WBC 15.0* 13.4*  HGB 14.5 11.9*  HCT 43.4 35.2*  PLT 197 165   BMET  Recent Labs  05/15/16 0328 05/17/16 0333  NA 140 138  K 4.1 3.6  CL 110 107  CO2 24 26  GLUCOSE 128* 94  BUN 13 12  CREATININE 0.82 0.58*  CALCIUM 8.2* 7.9*   PT/INR  Recent Labs  05/14/16 1518  LABPROT 12.9  INR 0.97   Comprehensive Metabolic Panel:    Component Value Date/Time   NA 138 05/17/2016 0333   NA 140 05/15/2016 0328   K 3.6 05/17/2016 0333   K 4.1 05/15/2016 0328   CL 107 05/17/2016 0333   CL 110 05/15/2016 0328   CO2 26 05/17/2016 0333   CO2 24 05/15/2016 0328   BUN 12 05/17/2016 0333   BUN 13 05/15/2016 0328   CREATININE 0.58 (L) 05/17/2016 0333   CREATININE 0.82 05/15/2016 0328   GLUCOSE 94 05/17/2016 0333   GLUCOSE 128 (H) 05/15/2016 0328   CALCIUM 7.9 (L) 05/17/2016 0333   CALCIUM 8.2 (L) 05/15/2016 0328   AST 62 (H) 05/17/2016 0333   AST 97 (H) 05/15/2016 0328   ALT 151 (H) 05/17/2016 0333   ALT 262 (H) 05/15/2016 0328   ALKPHOS 198 (H) 05/17/2016 0333   ALKPHOS 241 (H) 05/15/2016 0328   BILITOT 4.0 (H) 05/17/2016 0333   BILITOT 4.1 (H) 05/15/2016 0328   PROT 5.6 (L) 05/17/2016 0333   PROT 6.4 (L) 05/15/2016 0328   ALBUMIN 2.9 (L) 05/17/2016 0333   ALBUMIN 3.8 05/15/2016 0328     Studies/Results: Dg Ercp Biliary & Pancreatic Ducts  Result Date: 05/16/2016 CLINICAL DATA:  65 year old male with a history of gallstone pancreatitis. EXAM: ERCP TECHNIQUE: Multiple spot images obtained with the fluoroscopic device and submitted for interpretation post-procedure. FLUOROSCOPY TIME:  6 minutes, 38 seconds COMPARISON:  MR 05/14/2016 FINDINGS: Multiple intraoperative fluoroscopic spot images during ERCP. Initial image demonstrates endoscope over the upper abdomen with cannulation of the ampulla and retrograde infusion of contrast. Incomplete opacification of the extrahepatic biliary ducts. Images demonstrate rounded filling defects of the distal common bile duct just above the ampulla, compatible with findings on prior MR. Balloon basket present on the images. IMPRESSION: Limited images during ERCP demonstrate ill-defined filling defects in the distal common bile duct, compatible with choledocholithiasis evident on prior MRI, as well as utilization of balloon basket endoscopically. Please refer to the dictated operative report for full details of intraoperative findings and procedure. Signed, Dulcy Fanny. Earleen Newport, DO [facility] Radiology Electronically Signed   By: Corrie Mckusick D.O.   On: 05/16/2016 14:33    Anti-infectives: Anti-infectives    Start     Dose/Rate Route Frequency Ordered Stop   05/15/16  0000  piperacillin-tazobactam (ZOSYN) IVPB 3.375 g     3.375 g 12.5 mL/hr over 240 Minutes Intravenous Every 8 hours 05/14/16 1816     05/14/16 1815  piperacillin-tazobactam (ZOSYN) IVPB 3.375 g     3.375 g 100 mL/hr over 30 Minutes Intravenous  Once 05/14/16 1810 05/14/16 1915      Assessment   Gallstone pancreatitis-LFTs, lipase trending down; still with clinical signs of pancreatitis; not ready for cholecystectomy.   Choledocholithiasis-s/p ERCP and stone extraction   LOS: 3 days   Plan: Continue  bowel rest.  Repeat lab tomorrow.    Gerald Odom 05/17/2016

## 2016-05-17 NOTE — Progress Notes (Signed)
Pharmacy Antibiotic Note  Gerald DICHTER is a 65 y.o. male admitted on 05/14/2016 with intra-abdominal infection, gallstone pancreatitis , and multiple CBD stones on MRCP.  S/p ERCP on 7/31.  Pharmacy is consulted for Zosyn dosing.  Plan: Zosyn 3.375g IV q8h (4 hour infusion time).   Height: 5\' 7"  (170.2 cm) Weight: 175 lb (79.4 kg) IBW/kg (Calculated) : 66.1  Temp (24hrs), Avg:98.4 F (36.9 C), Min:98 F (36.7 C), Max:98.7 F (37.1 C)   Recent Labs Lab 05/14/16 1510 05/14/16 1512 05/14/16 2306 05/15/16 0328 05/17/16 0333 05/17/16 0543  WBC 11.3*  --  15.1* 15.0*  --  13.4*  CREATININE  --  0.80 0.86 0.82 0.58*  --     Estimated Creatinine Clearance: 90.5 mL/min (by C-G formula based on SCr of 0.8 mg/dL).    No Known Allergies  Antimicrobials this admission: 7/29 Zosyn >>   Dose adjustments this admission:  Microbiology results: 7/29 BCx: ngtd 7/29 MRSA PCR: negative  Thank you for allowing pharmacy to be a part of this patient's care.  Gretta Arab PharmD, BCPS Pager [phone] 05/17/2016 7:54 AM

## 2016-05-17 NOTE — Progress Notes (Signed)
Progress Note   Subjective  Patient underwent ERCP yesterday with stone extraction and he tolerated it well. Generally he reports feeling improved although continues to have some low level pain. LAEs continuw to downtrend, afebrile.     Objective   Vital signs in last 24 hours: Temp:  [98 F (36.7 C)-98.7 F (37.1 C)] 98.7 F (37.1 C) (08/01 0601) Pulse Rate:  [72-103] 85 (08/01 0601) Resp:  [14-17] 17 (08/01 0601) BP: (136-169)/(69-93) 158/83 (08/01 0601) SpO2:  [86 %-96 %] 91 % (08/01 0601) Weight:  [160 lb (72.6 kg)-175 lb (79.4 kg)] 175 lb (79.4 kg) (08/01 0601) Last BM Date: 05/11/16 General:    white male in NAD Heart:  Regular rate and rhythm; no murmurs Lungs: Respirations even and unlabored, lungs CTA bilaterally Abdomen:  Soft, mild epigastric and RUQ TTP without rebound, nondistended.  Extremities:  Without edema. Neurologic:  Alert and oriented,  grossly normal neurologically. Psych:  Cooperative. Normal mood and affect.  Intake/Output from previous day: 07/31 0701 - 08/01 0700 In: 1500 [I.V.:1500] Out: -  Intake/Output this shift: No intake/output data recorded.  Lab Results:  Recent Labs  05/14/16 2306 05/15/16 0328 05/17/16 0543  WBC 15.1* 15.0* 13.4*  HGB 15.1 14.5 11.9*  HCT 43.4 43.4 35.2*  PLT 188 197 165   BMET  Recent Labs  05/14/16 1512 05/14/16 2306 05/15/16 0328 05/17/16 0333  NA 139  --  140 138  K 3.7  --  4.1 3.6  CL 107  --  110 107  CO2 23  --  24 26  GLUCOSE 128*  --  128* 94  BUN 11  --  13 12  CREATININE 0.80 0.86 0.82 0.58*  CALCIUM 9.4  --  8.2* 7.9*   LFT  Recent Labs  05/17/16 0333  PROT 5.6*  ALBUMIN 2.9*  AST 62*  ALT 151*  ALKPHOS 198*  BILITOT 4.0*   PT/INR  Recent Labs  05/14/16 1518  LABPROT 12.9  INR 0.97    Studies/Results: Dg Ercp Biliary & Pancreatic Ducts  Result Date: 05/16/2016 CLINICAL DATA:  65 year old male with a history of gallstone pancreatitis. EXAM: ERCP TECHNIQUE:  Multiple spot images obtained with the fluoroscopic device and submitted for interpretation post-procedure. FLUOROSCOPY TIME:  6 minutes, 38 seconds COMPARISON:  MR 05/14/2016 FINDINGS: Multiple intraoperative fluoroscopic spot images during ERCP. Initial image demonstrates endoscope over the upper abdomen with cannulation of the ampulla and retrograde infusion of contrast. Incomplete opacification of the extrahepatic biliary ducts. Images demonstrate rounded filling defects of the distal common bile duct just above the ampulla, compatible with findings on prior MR. Balloon basket present on the images. IMPRESSION: Limited images during ERCP demonstrate ill-defined filling defects in the distal common bile duct, compatible with choledocholithiasis evident on prior MRI, as well as utilization of balloon basket endoscopically. Please refer to the dictated operative report for full details of intraoperative findings and procedure. Signed, Dulcy Fanny. Earleen Newport, DO [facility] Radiology Electronically Signed   By: Corrie Mckusick D.O.   On: 05/16/2016 14:33       Assessment / Plan:   65 y/o male admitted with gallstone pancreatitis / choledocholithiasis. ERCP done yesterday with stone extraction, no retained stones noted. LAEs slowly downtrending, patient reports some improvement in symptoms but counseled this can take some time for complete resolution of symptoms. Hgb slightly decreased but no bleeding symptoms, would monitor. Appreciate surgical consult, pending cholecystectomy prior to discharge. Please call with  further questions, will follow peripherally for now.   Boligee Cellar, MD [facility] Pager [phone]

## 2016-05-17 NOTE — Care Management Note (Signed)
Case Management Note  Patient Details  Name: TUYEN LACONTE MRN: [id number] Date of Birth: 1951/06/05  Subjective/Objective:       65 yo admitted with Gallstone Pancreatitis.             Action/Plan: From home with spouse. Chart reviewed and no CM needs identified or communicated. CM will continue to follow.  Expected Discharge Date:                  Expected Discharge Plan:  Home/Self Care  In-House Referral:     Discharge planning Services  CM Consult  Post Acute Care Choice:    Choice offered to:     DME Arranged:    DME Agency:     HH Arranged:    HH Agency:     Status of Service:  In process, will continue to follow  If discussed at Long Length of Stay Meetings, dates discussed:    Additional CommentsLynnell Catalan, RN 05/17/2016, 11:05 AM  [phone]

## 2016-05-17 NOTE — Progress Notes (Signed)
PROGRESS NOTE  Gerald Odom [id number] DOB: 20-Apr-1951 DOA: 05/14/2016 PCP: No primary care provider on file.   LOS: 3 days   Brief Narrative: 65 y.o. male, with H/O gout otherwise healthy who came to the ER for sudden onset right upper quadrant and epigastric abdominal pain radiating to his back, pain is constant, worse by eating food better with rest, associated with nausea vomiting, denies any fever chills, denies any similar problems before. He was found to have gallstone pancreatitis and was admitted on 7/29  Assessment & Plan: Principal Problem:   Gall stone pancreatitis Active Problems:   Gout   GERD (gastroesophageal reflux disease)   Acute cholangitis   Gallstone pancreatitis   Choledocholithiasis   Gallstone pancreatitis with possible ascending cholangitis and acute cholecystitis - MRI confirms stones in the CBD - Gen. Surgery and gastroenterology following - Status post ERCP on 7/31, when choledocholithiasis was found, status post complete removal and with biliary sphincterotomy and balloon extraction - Still remains symptomatic today. Patient will need cholecystectomy, timing will be determined by general surgery. - Continue IV Zosyn  Acute pancreatitis - Continue IV fluids, pain control  History of gout - Stable, no acute issues    DVT prophylaxis: Heparin Code Status: Full code Family Communication: no family bedside Disposition Plan: Transfer to [place]  Consultants:   General surgery  Gastroenterology  Procedures:   None  Antimicrobials:  Zosyn 7/29 >>   Subjective: - continues to be symptomatic. No dyspnea  Objective: Vitals:   05/16/16 1450 05/16/16 1542 05/16/16 2117 05/17/16 0601  BP: (!) 164/72 140/90 136/77 (!) 158/83  Pulse: 75 79 (!) 103 85  Resp: 14 16 17 17   Temp:  98.1 F (36.7 C) 98.6 F (37 C) 98.7 F (37.1 C)  TempSrc:  Oral Oral Oral  SpO2: 92% 93% 90% 91%  Weight:    79.4 kg (175 lb)  Height:         Intake/Output Summary (Last 24 hours) at 05/17/16 1051 Last data filed at 05/16/16 1412  Gross per 24 hour  Intake             1500 ml  Output                0 ml  Net             1500 ml   Filed Weights   05/15/16 0500 05/16/16 1139 05/17/16 0601  Weight: 73.2 kg (161 lb 6 oz) 72.6 kg (160 lb) 79.4 kg (175 lb)    Examination: Constitutional: NAD Vitals:   05/16/16 1450 05/16/16 1542 05/16/16 2117 05/17/16 0601  BP: (!) 164/72 140/90 136/77 (!) 158/83  Pulse: 75 79 (!) 103 85  Resp: 14 16 17 17   Temp:  98.1 F (36.7 C) 98.6 F (37 C) 98.7 F (37.1 C)  TempSrc:  Oral Oral Oral  SpO2: 92% 93% 90% 91%  Weight:    79.4 kg (175 lb)  Height:       Eyes: PERRL, lids and conjunctivae normal Respiratory: clear to auscultation bilaterally, no wheezing, no crackles. Normal respiratory effort.  Cardiovascular: Regular rate and rhythm, no murmurs / rubs / gallops. No LE edema.  Abdomen: Tender in the right upper quadrant. Bowel sounds positive.     Data Reviewed: I have personally reviewed following labs and imaging studies  CBC:  Recent Labs Lab 05/14/16 1510 05/14/16 2306 05/15/16 0328 05/17/16 0543  WBC 11.3* 15.1* 15.0* 13.4*  NEUTROABS 7.7  --   --   --  HGB 16.6 15.1 14.5 11.9*  HCT 47.3 43.4 43.4 35.2*  MCV 91.8 92.9 93.3 94.9  PLT 233 188 197 [id number]   Basic Metabolic Panel:  Recent Labs Lab 05/14/16 1512 05/14/16 2306 05/15/16 0328 05/17/16 0333  NA 139  --  140 138  K 3.7  --  4.1 3.6  CL 107  --  110 107  CO2 23  --  24 26  GLUCOSE 128*  --  128* 94  BUN 11  --  13 12  CREATININE 0.80 0.86 0.82 0.58*  CALCIUM 9.4  --  8.2* 7.9*   GFR: Estimated Creatinine Clearance: 90.5 mL/min (by C-G formula based on SCr of 0.8 mg/dL). Liver Function Tests:  Recent Labs Lab 05/14/16 1512 05/15/16 0328 05/17/16 0333  AST 147* 97* 62*  ALT 346* 262* 151*  ALKPHOS 291* 241* 198*  BILITOT 4.9* 4.1* 4.0*  PROT 7.6 6.4* 5.6*  ALBUMIN 4.4 3.8 2.9*     Recent Labs Lab 05/14/16 1512 05/16/16 0409 05/17/16 0333  LIPASE 2,628* 130* 42   No results for input(s): AMMONIA in the last 168 hours. Coagulation Profile:  Recent Labs Lab 05/14/16 1518  INR 0.97   Cardiac Enzymes: No results for input(s): CKTOTAL, CKMB, CKMBINDEX, TROPONINI in the last 168 hours. BNP (last 3 results) No results for input(s): PROBNP in the last 8760 hours. HbA1C: No results for input(s): HGBA1C in the last 72 hours. CBG: No results for input(s): GLUCAP in the last 168 hours. Lipid Profile: No results for input(s): CHOL, HDL, LDLCALC, TRIG, CHOLHDL, LDLDIRECT in the last 72 hours. Thyroid Function Tests: No results for input(s): TSH, T4TOTAL, FREET4, T3FREE, THYROIDAB in the last 72 hours. Anemia Panel: No results for input(s): VITAMINB12, FOLATE, FERRITIN, TIBC, IRON, RETICCTPCT in the last 72 hours. Urine analysis:    Component Value Date/Time   COLORURINE ORANGE (A) 05/14/2016 1610   APPEARANCEUR CLEAR 05/14/2016 1610   LABSPEC 1.014 05/14/2016 1610   PHURINE 5.0 05/14/2016 1610   GLUCOSEU NEGATIVE 05/14/2016 1610   HGBUR NEGATIVE 05/14/2016 1610   BILIRUBINUR LARGE (A) 05/14/2016 1610   KETONESUR NEGATIVE 05/14/2016 1610   PROTEINUR NEGATIVE 05/14/2016 1610   NITRITE NEGATIVE 05/14/2016 1610   LEUKOCYTESUR TRACE (A) 05/14/2016 1610   Sepsis Labs: Invalid input(s): PROCALCITONIN, LACTICIDVEN  Recent Results (from the past 240 hour(s))  Blood culture (routine x 2)     Status: None (Preliminary result)   Collection Time: 05/14/16  6:34 PM  Result Value Ref Range Status   Specimen Description BLOOD RIGHT ANTECUBITAL  Final   Special Requests BOTTLES DRAWN AEROBIC AND ANAEROBIC 5CC  Final   Culture   Final    NO GROWTH 1 DAY Performed at [facility]    Report Status PENDING  Incomplete  Blood culture (routine x 2)     Status: None (Preliminary result)   Collection Time: 05/14/16  6:35 PM  Result Value Ref Range Status    Specimen Description BLOOD LEFT ANTECUBITAL  Final   Special Requests BOTTLES DRAWN AEROBIC AND ANAEROBIC 5CC  Final   Culture   Final    NO GROWTH 1 DAY Performed at [facility]    Report Status PENDING  Incomplete  MRSA PCR Screening     Status: None   Collection Time: 05/14/16 10:42 PM  Result Value Ref Range Status   MRSA by PCR NEGATIVE NEGATIVE Final    Comment:        The GeneXpert MRSA Assay (FDA approved for  NASAL specimens only), is one component of a comprehensive MRSA colonization surveillance program. It is not intended to diagnose MRSA infection nor to guide or monitor treatment for MRSA infections.       Radiology Studies: Dg Ercp Biliary & Pancreatic Ducts  Result Date: 05/16/2016 CLINICAL DATA:  65 year old male with a history of gallstone pancreatitis. EXAM: ERCP TECHNIQUE: Multiple spot images obtained with the fluoroscopic device and submitted for interpretation post-procedure. FLUOROSCOPY TIME:  6 minutes, 38 seconds COMPARISON:  MR 05/14/2016 FINDINGS: Multiple intraoperative fluoroscopic spot images during ERCP. Initial image demonstrates endoscope over the upper abdomen with cannulation of the ampulla and retrograde infusion of contrast. Incomplete opacification of the extrahepatic biliary ducts. Images demonstrate rounded filling defects of the distal common bile duct just above the ampulla, compatible with findings on prior MR. Balloon basket present on the images. IMPRESSION: Limited images during ERCP demonstrate ill-defined filling defects in the distal common bile duct, compatible with choledocholithiasis evident on prior MRI, as well as utilization of balloon basket endoscopically. Please refer to the dictated operative report for full details of intraoperative findings and procedure. Signed, Dulcy Fanny. Earleen Newport, DO [facility] Radiology Electronically Signed   By: Corrie Mckusick D.O.   On: 05/16/2016 14:33      Scheduled Meds: . piperacillin-tazobactam (ZOSYN)  IV  3.375 g Intravenous Q8H  . sodium chloride flush  3 mL Intravenous Q12H   Continuous Infusions: . sodium chloride 150 mL/hr at 05/17/16 0430     Marzetta Board, MD, PhD [facility] Pager [phone]  If 7PM-7AM, please contact night-coverage [email or web address] Password [email or web address] 05/17/2016, 10:51 AM

## 2016-05-18 ENCOUNTER — Encounter (HOSPITAL_COMMUNITY)
Admission: EM | Disposition: A | Discharge status: Home / Self Care | Payer: Self-pay | Source: Home / Self Care | Attending: Internal Medicine

## 2016-05-18 DIAGNOSIS — K8050 Calculus of bile duct without cholangitis or cholecystitis without obstruction: Secondary | ICD-10-CM | POA: Diagnosis not present

## 2016-05-18 DIAGNOSIS — K219 Gastro-esophageal reflux disease without esophagitis: Secondary | ICD-10-CM | POA: Diagnosis not present

## 2016-05-18 DIAGNOSIS — K851 Biliary acute pancreatitis without necrosis or infection: Secondary | ICD-10-CM | POA: Diagnosis not present

## 2016-05-18 DIAGNOSIS — K830 Cholangitis: Secondary | ICD-10-CM | POA: Diagnosis not present

## 2016-05-18 LAB — COMPREHENSIVE METABOLIC PANEL
ALK PHOS: 193 U/L — AB (ref 38–126)
ALT: 126 U/L — AB (ref 17–63)
AST: 45 U/L — AB (ref 15–41)
Albumin: 3.1 g/dL — ABNORMAL LOW (ref 3.5–5.0)
Anion gap: 10 (ref 5–15)
BILIRUBIN TOTAL: 2.3 mg/dL — AB (ref 0.3–1.2)
BUN: 10 mg/dL (ref 6–20)
CALCIUM: 8.2 mg/dL — AB (ref 8.9–10.3)
CHLORIDE: 101 mmol/L (ref 101–111)
CO2: 27 mmol/L (ref 22–32)
CREATININE: 0.58 mg/dL — AB (ref 0.61–1.24)
GFR calc Af Amer: >60 mL/min (ref >60)
Glucose, Bld: 99 mg/dL (ref 65–99)
Potassium: 3.2 mmol/L — ABNORMAL LOW (ref 3.5–5.1)
Sodium: 138 mmol/L (ref 135–145)
TOTAL PROTEIN: 6 g/dL — AB (ref 6.5–8.1)

## 2016-05-18 LAB — CBC
HEMATOCRIT: 35.4 % — AB (ref 39.0–52.0)
HEMOGLOBIN: 12.1 g/dL — AB (ref 13.0–17.0)
MCH: 32.2 pg (ref 26.0–34.0)
MCHC: 34.2 g/dL (ref 30.0–36.0)
MCV: 94.1 fL (ref 78.0–100.0)
Platelets: 186 10*3/uL (ref 150–400)
RBC: 3.76 MIL/uL — AB (ref 4.22–5.81)
RDW: 13.8 % (ref 11.5–15.5)
WBC: 12.1 10*3/uL — AB (ref 4.0–10.5)

## 2016-05-18 LAB — LIPASE, BLOOD: LIPASE: 21 U/L (ref 11–51)

## 2016-05-18 SURGERY — LAPAROSCOPIC CHOLECYSTECTOMY WITH INTRAOPERATIVE CHOLANGIOGRAM
Anesthesia: General

## 2016-05-18 MED ORDER — MAGNESIUM SULFATE 2 GM/50ML IV SOLN
2.0000 g | Freq: Once | INTRAVENOUS | Status: AC
  Administered 2016-05-18: 2 g via INTRAVENOUS
  Filled 2016-05-18: qty 50

## 2016-05-18 MED ORDER — POTASSIUM CHLORIDE CRYS ER 20 MEQ PO TBCR
40.0000 meq | EXTENDED_RELEASE_TABLET | Freq: Four times a day (QID) | ORAL | Status: AC
  Administered 2016-05-18 (×2): 40 meq via ORAL
  Filled 2016-05-18 (×2): qty 2

## 2016-05-18 MED ORDER — HEPARIN SODIUM (PORCINE) 5000 UNIT/ML IJ SOLN
5000.0000 [IU] | Freq: Three times a day (TID) | INTRAMUSCULAR | Status: DC
  Administered 2016-05-18 – 2016-05-20 (×6): 5000 [IU] via SUBCUTANEOUS
  Filled 2016-05-18 (×6): qty 1

## 2016-05-18 MED ORDER — FUROSEMIDE 10 MG/ML IJ SOLN
40.0000 mg | Freq: Once | INTRAMUSCULAR | Status: AC
  Administered 2016-05-18: 40 mg via INTRAVENOUS
  Filled 2016-05-18: qty 4

## 2016-05-18 NOTE — Progress Notes (Signed)
2 Days Post-Op  Subjective: Continues to have epigastric pain and now feels it in his back.  Had hypoxia when walking yesterday.  Coughing some.  Objective: Vital signs in last 24 hours: Temp:  [97.9 F (36.6 C)-98.4 F (36.9 C)] 97.9 F (36.6 C) (08/02 0615) Pulse Rate:  [73-82] 73 (08/02 0615) Resp:  [16] 16 (08/02 0615) BP: (162-164)/(86-92) 164/86 (08/02 0615) SpO2:  [93 %-94 %] 93 % (08/02 0615) Weight:  [77.6 kg (171 lb)] 77.6 kg (171 lb) (08/02 0615) Last BM Date: 05/14/16  Intake/Output from previous day: 08/01 0701 - 08/02 0700 In: -  Out: 1200 [Urine:1200] Intake/Output this shift: No intake/output data recorded.  PE: General- In NAD Lungs-decreased breath sounds right base Abdomen-soft, tender in epigastrium  Lab Results:   Recent Labs  05/17/16 0543 05/18/16 0400  WBC 13.4* 12.1*  HGB 11.9* 12.1*  HCT 35.2* 35.4*  PLT 165 186   BMET  Recent Labs  05/17/16 0333 05/18/16 0400  NA 138 138  K 3.6 3.2*  CL 107 101  CO2 26 27  GLUCOSE 94 99  BUN 12 10  CREATININE 0.58* 0.58*  CALCIUM 7.9* 8.2*   PT/INR No results for input(s): LABPROT, INR in the last 72 hours. Comprehensive Metabolic Panel:    Component Value Date/Time   NA 138 05/18/2016 0400   NA 138 05/17/2016 0333   K 3.2 (L) 05/18/2016 0400   K 3.6 05/17/2016 0333   CL 101 05/18/2016 0400   CL 107 05/17/2016 0333   CO2 27 05/18/2016 0400   CO2 26 05/17/2016 0333   BUN 10 05/18/2016 0400   BUN 12 05/17/2016 0333   CREATININE 0.58 (L) 05/18/2016 0400   CREATININE 0.58 (L) 05/17/2016 0333   GLUCOSE 99 05/18/2016 0400   GLUCOSE 94 05/17/2016 0333   CALCIUM 8.2 (L) 05/18/2016 0400   CALCIUM 7.9 (L) 05/17/2016 0333   AST 45 (H) 05/18/2016 0400   AST 62 (H) 05/17/2016 0333   ALT 126 (H) 05/18/2016 0400   ALT 151 (H) 05/17/2016 0333   ALKPHOS 193 (H) 05/18/2016 0400   ALKPHOS 198 (H) 05/17/2016 0333   BILITOT 2.3 (H) 05/18/2016 0400   BILITOT 4.0 (H) 05/17/2016 0333   PROT 6.0  (L) 05/18/2016 0400   PROT 5.6 (L) 05/17/2016 0333   ALBUMIN 3.1 (L) 05/18/2016 0400   ALBUMIN 2.9 (L) 05/17/2016 0333     Studies/Results: Dg Chest Port 1 View  Result Date: 05/17/2016 CLINICAL DATA:  Dyspnea for 2-3 days, smoker EXAM: PORTABLE CHEST 1 VIEW COMPARISON:  Portable exam 1725 hours compared to 05/14/2016 FINDINGS: Normal heart size, mediastinal contours, and pulmonary vascularity. Underlying emphysematous changes. Atelectasis versus scarring at LEFT base. Increased RIGHT basilar opacity question infiltrate. Potential fluid within the lateral aspect of the minor fissure. Upper lungs emphysematous but clear. No pneumothorax or acute osseous findings. IMPRESSION: COPD changes with LEFT basilar scarring. RIGHT basilar infiltrate and suspect small pleural effusion. Electronically Signed   By: Lavonia Dana M.D.   On: 05/17/2016 17:54   Dg Ercp Biliary & Pancreatic Ducts  Result Date: 05/16/2016 CLINICAL DATA:  65 year old male with a history of gallstone pancreatitis. EXAM: ERCP TECHNIQUE: Multiple spot images obtained with the fluoroscopic device and submitted for interpretation post-procedure. FLUOROSCOPY TIME:  6 minutes, 38 seconds COMPARISON:  MR 05/14/2016 FINDINGS: Multiple intraoperative fluoroscopic spot images during ERCP. Initial image demonstrates endoscope over the upper abdomen with cannulation of the ampulla and retrograde infusion of contrast. Incomplete opacification of the extrahepatic  biliary ducts. Images demonstrate rounded filling defects of the distal common bile duct just above the ampulla, compatible with findings on prior MR. Balloon basket present on the images. IMPRESSION: Limited images during ERCP demonstrate ill-defined filling defects in the distal common bile duct, compatible with choledocholithiasis evident on prior MRI, as well as utilization of balloon basket endoscopically. Please refer to the dictated operative report for full details of intraoperative  findings and procedure. Signed, Dulcy Fanny. Earleen Newport, DO [facility] Radiology Electronically Signed   By: Corrie Mckusick D.O.   On: 05/16/2016 14:33    Anti-infectives: Anti-infectives    Start     Dose/Rate Route Frequency Ordered Stop   05/15/16 0000  piperacillin-tazobactam (ZOSYN) IVPB 3.375 g     3.375 g 12.5 mL/hr over 240 Minutes Intravenous Every 8 hours 05/14/16 1816     05/14/16 1815  piperacillin-tazobactam (ZOSYN) IVPB 3.375 g     3.375 g 100 mL/hr over 30 Minutes Intravenous  Once 05/14/16 1810 05/14/16 1915      Assessment   Gallstone pancreatitis-LFTs, lipase still trending down, but clinical signs and symptoms still lingering; still not ready for cholecystectomy.   Choledocholithiasis-s/p ERCP and stone extraction    RLL infiltrate on CXR with hypoxia during walking suggests PNA.   LOS: 4 days   Plan: Continue bowel rest.  CT tomorrow if pancreatitis symptoms and signs persist.  Pulmonary work up per Medicine.    Toree Edling J 05/18/2016

## 2016-05-18 NOTE — Care Management Important Message (Signed)
Important Message  Patient Details  Name: COHAN YOUNGERMAN MRN: [id number] Date of Birth: 07-10-1951   Medicare Important Message Given:  Yes    Camillo Flaming 05/18/2016, 11:05 AMImportant Message  Patient Details  Name: LEHMAN HANSER MRN: [id number] Date of Birth: 06-17-1951   Medicare Important Message Given:  Yes    Camillo Flaming 05/18/2016, 11:05 AM

## 2016-05-18 NOTE — Progress Notes (Signed)
PROGRESS NOTE  Gerald Odom [id number] DOB: 01-29-1951 DOA: 05/14/2016 PCP: No primary care provider on file.   LOS: 4 days   Brief Narrative: 65 y.o. male, with H/O gout otherwise healthy who came to the ER for sudden onset right upper quadrant and epigastric abdominal pain radiating to his back, pain is constant, worse by eating food better with rest, associated with nausea vomiting, denies any fever chills, denies any similar problems before. He was found to have gallstone pancreatitis and was admitted on 7/29  Subjective: Feels much better, talked to his son over the phone. Still has minimal pain on his back towards the right side.  Assessment & Plan: Principal Problem:   Gall stone pancreatitis Active Problems:   Gout   GERD (gastroesophageal reflux disease)   Acute cholangitis   Gallstone pancreatitis   Choledocholithiasis   Gallstone pancreatitis with possible acute cholecystitis - MRI confirms stones in the CBD - Gen. Surgery and gastroenterology following - Status post ERCP on 7/31, when choledocholithiasis was found, status post complete removal and with biliary sphincterotomy and balloon extraction - Continue Zosyn, seen by general surgery, postponed cholecystectomy.  Acute respiratory failure with hypoxia -Started to be hypoxic yesterday, now requires 3 L of oxygen to keep oxygen saturation around 93%. -CXR showed right-sided infiltrates and minimal effusion. Given Lasix yesterday. -Started on antibiotics. Start on Mucinex. Unlikely to be PE, does not have tachycardia or chest pain.  Acute pancreatitis - This is improving, presented with lipase of >2300, lipase is back to normal. -This is improving, started on clear liquid diet today.  History of gout - Stable, no acute issues  Hypokalemia -This is secondary to diuresis, replete with oral supplements.  DVT prophylaxis: Heparin (was held for the sphincterotomy) restarted today. Code Status: Full  code Family Communication: no family bedside Disposition Plan: Transfer to [place]  Consultants:   General surgery  Gastroenterology  Procedures:   None  Antimicrobials:  Zosyn 7/29 >>   Objective: Vitals:   05/17/16 0601 05/17/16 1417 05/17/16 2240 05/18/16 0615  BP: (!) 158/83 (!) 162/92 (!) 163/89 (!) 164/86  Pulse: 85 80 82 73  Resp: 17 16 16 16   Temp: 98.7 F (37.1 C) 98.4 F (36.9 C) 98.2 F (36.8 C) 97.9 F (36.6 C)  TempSrc: Oral Oral Oral Oral  SpO2: 91% 94% 93% 93%  Weight: 79.4 kg (175 lb)   77.6 kg (171 lb)  Height:        Intake/Output Summary (Last 24 hours) at 05/18/16 1216 Last data filed at 05/18/16 0900  Gross per 24 hour  Intake                0 ml  Output             1400 ml  Net            -1400 ml   Filed Weights   05/16/16 1139 05/17/16 0601 05/18/16 0615  Weight: 72.6 kg (160 lb) 79.4 kg (175 lb) 77.6 kg (171 lb)    Examination: Constitutional: NAD Vitals:   05/17/16 0601 05/17/16 1417 05/17/16 2240 05/18/16 0615  BP: (!) 158/83 (!) 162/92 (!) 163/89 (!) 164/86  Pulse: 85 80 82 73  Resp: 17 16 16 16   Temp: 98.7 F (37.1 C) 98.4 F (36.9 C) 98.2 F (36.8 C) 97.9 F (36.6 C)  TempSrc: Oral Oral Oral Oral  SpO2: 91% 94% 93% 93%  Weight: 79.4 kg (175 lb)   77.6 kg (171 lb)  Height:       Eyes: PERRL, lids and conjunctivae normal Respiratory: clear to auscultation bilaterally, no wheezing, no crackles. Normal respiratory effort.  Cardiovascular: Regular rate and rhythm, no murmurs / rubs / gallops. No LE edema.  Abdomen: Tender in the right upper quadrant. Bowel sounds positive.     Data Reviewed: I have personally reviewed following labs and imaging studies  CBC:  Recent Labs Lab 05/14/16 1510 05/14/16 2306 05/15/16 0328 05/17/16 0543 05/18/16 0400  WBC 11.3* 15.1* 15.0* 13.4* 12.1*  NEUTROABS 7.7  --   --   --   --   HGB 16.6 15.1 14.5 11.9* 12.1*  HCT 47.3 43.4 43.4 35.2* 35.4*  MCV 91.8 92.9 93.3 94.9 94.1   PLT 233 188 197 165 [id number]   Basic Metabolic Panel:  Recent Labs Lab 05/14/16 1512 05/14/16 2306 05/15/16 0328 05/17/16 0333 05/18/16 0400  NA 139  --  140 138 138  K 3.7  --  4.1 3.6 3.2*  CL 107  --  110 107 101  CO2 23  --  24 26 27   GLUCOSE 128*  --  128* 94 99  BUN 11  --  13 12 10   CREATININE 0.80 0.86 0.82 0.58* 0.58*  CALCIUM 9.4  --  8.2* 7.9* 8.2*   GFR: Estimated Creatinine Clearance: 83.8 mL/min (by C-G formula based on SCr of 0.8 mg/dL). Liver Function Tests:  Recent Labs Lab 05/14/16 1512 05/15/16 0328 05/17/16 0333 05/18/16 0400  AST 147* 97* 62* 45*  ALT 346* 262* 151* 126*  ALKPHOS 291* 241* 198* 193*  BILITOT 4.9* 4.1* 4.0* 2.3*  PROT 7.6 6.4* 5.6* 6.0*  ALBUMIN 4.4 3.8 2.9* 3.1*    Recent Labs Lab 05/14/16 1512 05/16/16 0409 05/17/16 0333 05/18/16 0400  LIPASE 2,628* 130* 42 21   No results for input(s): AMMONIA in the last 168 hours. Coagulation Profile:  Recent Labs Lab 05/14/16 1518  INR 0.97   Cardiac Enzymes: No results for input(s): CKTOTAL, CKMB, CKMBINDEX, TROPONINI in the last 168 hours. BNP (last 3 results) No results for input(s): PROBNP in the last 8760 hours. HbA1C: No results for input(s): HGBA1C in the last 72 hours. CBG: No results for input(s): GLUCAP in the last 168 hours. Lipid Profile: No results for input(s): CHOL, HDL, LDLCALC, TRIG, CHOLHDL, LDLDIRECT in the last 72 hours. Thyroid Function Tests: No results for input(s): TSH, T4TOTAL, FREET4, T3FREE, THYROIDAB in the last 72 hours. Anemia Panel: No results for input(s): VITAMINB12, FOLATE, FERRITIN, TIBC, IRON, RETICCTPCT in the last 72 hours. Urine analysis:    Component Value Date/Time   COLORURINE ORANGE (A) 05/14/2016 1610   APPEARANCEUR CLEAR 05/14/2016 1610   LABSPEC 1.014 05/14/2016 1610   PHURINE 5.0 05/14/2016 1610   GLUCOSEU NEGATIVE 05/14/2016 1610   HGBUR NEGATIVE 05/14/2016 1610   BILIRUBINUR LARGE (A) 05/14/2016 1610   KETONESUR  NEGATIVE 05/14/2016 1610   PROTEINUR NEGATIVE 05/14/2016 1610   NITRITE NEGATIVE 05/14/2016 1610   LEUKOCYTESUR TRACE (A) 05/14/2016 1610   Sepsis Labs: Invalid input(s): PROCALCITONIN, LACTICIDVEN  Recent Results (from the past 240 hour(s))  Blood culture (routine x 2)     Status: None (Preliminary result)   Collection Time: 05/14/16  6:34 PM  Result Value Ref Range Status   Specimen Description BLOOD RIGHT ANTECUBITAL  Final   Special Requests BOTTLES DRAWN AEROBIC AND ANAEROBIC 5CC  Final   Culture   Final    NO GROWTH 2 DAYS Performed at [facility]  Report Status PENDING  Incomplete  Blood culture (routine x 2)     Status: None (Preliminary result)   Collection Time: 05/14/16  6:35 PM  Result Value Ref Range Status   Specimen Description BLOOD LEFT ANTECUBITAL  Final   Special Requests BOTTLES DRAWN AEROBIC AND ANAEROBIC 5CC  Final   Culture   Final    NO GROWTH 2 DAYS Performed at [facility]    Report Status PENDING  Incomplete  MRSA PCR Screening     Status: None   Collection Time: 05/14/16 10:42 PM  Result Value Ref Range Status   MRSA by PCR NEGATIVE NEGATIVE Final    Comment:        The GeneXpert MRSA Assay (FDA approved for NASAL specimens only), is one component of a comprehensive MRSA colonization surveillance program. It is not intended to diagnose MRSA infection nor to guide or monitor treatment for MRSA infections.       Radiology Studies: Dg Chest Port 1 View  Result Date: 05/17/2016 CLINICAL DATA:  Dyspnea for 2-3 days, smoker EXAM: PORTABLE CHEST 1 VIEW COMPARISON:  Portable exam 1725 hours compared to 05/14/2016 FINDINGS: Normal heart size, mediastinal contours, and pulmonary vascularity. Underlying emphysematous changes. Atelectasis versus scarring at LEFT base. Increased RIGHT basilar opacity question infiltrate. Potential fluid within the lateral aspect of the minor fissure. Upper lungs emphysematous but clear. No pneumothorax  or acute osseous findings. IMPRESSION: COPD changes with LEFT basilar scarring. RIGHT basilar infiltrate and suspect small pleural effusion. Electronically Signed   By: Lavonia Dana M.D.   On: 05/17/2016 17:54   Dg Ercp Biliary & Pancreatic Ducts  Result Date: 05/16/2016 CLINICAL DATA:  65 year old male with a history of gallstone pancreatitis. EXAM: ERCP TECHNIQUE: Multiple spot images obtained with the fluoroscopic device and submitted for interpretation post-procedure. FLUOROSCOPY TIME:  6 minutes, 38 seconds COMPARISON:  MR 05/14/2016 FINDINGS: Multiple intraoperative fluoroscopic spot images during ERCP. Initial image demonstrates endoscope over the upper abdomen with cannulation of the ampulla and retrograde infusion of contrast. Incomplete opacification of the extrahepatic biliary ducts. Images demonstrate rounded filling defects of the distal common bile duct just above the ampulla, compatible with findings on prior MR. Balloon basket present on the images. IMPRESSION: Limited images during ERCP demonstrate ill-defined filling defects in the distal common bile duct, compatible with choledocholithiasis evident on prior MRI, as well as utilization of balloon basket endoscopically. Please refer to the dictated operative report for full details of intraoperative findings and procedure. Signed, Dulcy Fanny. Earleen Newport, DO [facility] Radiology Electronically Signed   By: Corrie Mckusick D.O.   On: 05/16/2016 14:33     Scheduled Meds: . furosemide  40 mg Intravenous Once  . magnesium sulfate 1 - 4 g bolus IVPB  2 g Intravenous Once  . piperacillin-tazobactam (ZOSYN)  IV  3.375 g Intravenous Q8H  . potassium chloride  40 mEq Oral Q6H  . sodium chloride flush  3 mL Intravenous Q12H   Continuous Infusions:     Birdie Hopes, MD [facility] Pager [phone]  If 7PM-7AM, please contact night-coverage [email or web address] Password [email or web address] 05/18/2016, 12:16 PM

## 2016-05-19 DIAGNOSIS — K219 Gastro-esophageal reflux disease without esophagitis: Secondary | ICD-10-CM | POA: Diagnosis not present

## 2016-05-19 DIAGNOSIS — K8050 Calculus of bile duct without cholangitis or cholecystitis without obstruction: Secondary | ICD-10-CM | POA: Diagnosis not present

## 2016-05-19 DIAGNOSIS — K851 Biliary acute pancreatitis without necrosis or infection: Secondary | ICD-10-CM | POA: Diagnosis not present

## 2016-05-19 DIAGNOSIS — K830 Cholangitis: Secondary | ICD-10-CM | POA: Diagnosis not present

## 2016-05-19 LAB — BASIC METABOLIC PANEL
ANION GAP: 7 (ref 5–15)
BUN: 10 mg/dL (ref 6–20)
CHLORIDE: 103 mmol/L (ref 101–111)
CO2: 26 mmol/L (ref 22–32)
Calcium: 8.4 mg/dL — ABNORMAL LOW (ref 8.9–10.3)
Creatinine, Ser: 0.65 mg/dL (ref 0.61–1.24)
GFR calc Af Amer: >60 mL/min (ref >60)
GFR calc non Af Amer: >60 mL/min (ref >60)
GLUCOSE: 108 mg/dL — AB (ref 65–99)
POTASSIUM: 3.5 mmol/L (ref 3.5–5.1)
SODIUM: 136 mmol/L (ref 135–145)

## 2016-05-19 LAB — MAGNESIUM: Magnesium: 2 mg/dL (ref 1.7–2.4)

## 2016-05-19 MED ORDER — FUROSEMIDE 10 MG/ML IJ SOLN
40.0000 mg | Freq: Once | INTRAMUSCULAR | Status: AC
Start: 2016-05-19 — End: 2016-05-19
  Administered 2016-05-19: 40 mg via INTRAVENOUS
  Filled 2016-05-19: qty 4

## 2016-05-19 MED ORDER — POTASSIUM CHLORIDE CRYS ER 20 MEQ PO TBCR
40.0000 meq | EXTENDED_RELEASE_TABLET | Freq: Once | ORAL | Status: AC
  Administered 2016-05-19: 40 meq via ORAL
  Filled 2016-05-19: qty 2

## 2016-05-19 MED ORDER — POTASSIUM CHLORIDE CRYS ER 10 MEQ PO TBCR
40.0000 meq | EXTENDED_RELEASE_TABLET | Freq: Once | ORAL | Status: AC
Start: 2016-05-19 — End: 2016-05-19
  Administered 2016-05-19: 40 meq via ORAL
  Filled 2016-05-19: qty 4

## 2016-05-19 NOTE — Progress Notes (Signed)
PROGRESS NOTE  Gerald Odom [id number] DOB: 06/14/1951 DOA: 05/14/2016 PCP: No primary care provider on file.   LOS: 5 days   Brief Narrative: 65 y.o. male, with H/O gout otherwise healthy who came to the ER for sudden onset right upper quadrant and epigastric abdominal pain radiating to his back, pain is constant, worse by eating food better with rest, associated with nausea vomiting, denies any fever chills, denies any similar problems before. He was found to have gallstone pancreatitis and was admitted on 7/29  Subjective: Seen with his son at bedside, and multiple questions about the care provided by nursing staff. Less abdominal pain, tolerated clear liquids and asked for diet to be advanced.  Assessment & Plan: Principal Problem:   Gall stone pancreatitis Active Problems:   Gout   GERD (gastroesophageal reflux disease)   Acute cholangitis   Gallstone pancreatitis   Choledocholithiasis   Gallstone pancreatitis with possible acute cholecystitis - MRI confirms stones in the CBD - Gen. Surgery and gastroenterology following - Status post ERCP on 7/31, when choledocholithiasis was found, status post complete removal and with biliary sphincterotomy and balloon extraction - Cholecystectomy postponed because of shortness of breath and hypoxia. -Pancreatitis improving, diet advance to heart healthy today by general surgery.  Acute respiratory failure with hypoxia -Started to be hypoxic yesterday, now requires 3 L of oxygen to keep oxygen saturation around 93%. -CXR showed right-sided infiltrates and minimal effusion. Given Lasix yesterday. -Started on antibiotics. Start on Mucinex. Unlikely to be PE, does not have tachycardia or chest pain. -Continue diuresis, on Zosyn already, ambulate and wean off of oxygen.  Acute pancreatitis - This is improving, presented with lipase of >2300, lipase is back to normal. -This is improving, was on clear liquids tolerated very well advance  to heart healthy.  History of gout - Stable, no acute issues  Hypokalemia -This is secondary to diuresis, replete with oral supplements.   DVT prophylaxis: SubQ Heparin Code Status: Full code Family Communication: Son Production designer, theatre/television/film) at bedside Disposition Plan: Home in 1-2 days.  Consultants:   General surgery  Gastroenterology  Procedures:   None  Antimicrobials:  Zosyn 7/29 >>   Objective: Vitals:   05/18/16 0615 05/18/16 1410 05/18/16 2129 05/19/16 0507  BP: (!) 164/86 (!) 146/89 (!) 158/92 (!) 156/84  Pulse: 73 77 74 69  Resp: 16 16 16 16   Temp: 97.9 F (36.6 C) 97.7 F (36.5 C) 98.8 F (37.1 C) 98.4 F (36.9 C)  TempSrc: Oral Oral Oral Oral  SpO2: 93% 96% 93% 94%  Weight: 77.6 kg (171 lb)   75.2 kg (165 lb 12.6 oz)  Height:        Intake/Output Summary (Last 24 hours) at 05/19/16 1212 Last data filed at 05/19/16 0941  Gross per 24 hour  Intake              947 ml  Output             4025 ml  Net            -3078 ml   Filed Weights   05/17/16 0601 05/18/16 0615 05/19/16 0507  Weight: 79.4 kg (175 lb) 77.6 kg (171 lb) 75.2 kg (165 lb 12.6 oz)    Examination: Constitutional: NAD Vitals:   05/18/16 0615 05/18/16 1410 05/18/16 2129 05/19/16 0507  BP: (!) 164/86 (!) 146/89 (!) 158/92 (!) 156/84  Pulse: 73 77 74 69  Resp: 16 16 16 16   Temp: 97.9 F (36.6 C) 97.7  F (36.5 C) 98.8 F (37.1 C) 98.4 F (36.9 C)  TempSrc: Oral Oral Oral Oral  SpO2: 93% 96% 93% 94%  Weight: 77.6 kg (171 lb)   75.2 kg (165 lb 12.6 oz)  Height:       Eyes: PERRL, lids and conjunctivae normal Respiratory: clear to auscultation bilaterally, no wheezing, no crackles. Normal respiratory effort.  Cardiovascular: Regular rate and rhythm, no murmurs / rubs / gallops. No LE edema.  Abdomen: Tender in the right upper quadrant. Bowel sounds positive.     Data Reviewed: I have personally reviewed following labs and imaging studies  CBC:  Recent Labs Lab 05/14/16 1510  05/14/16 2306 05/15/16 0328 05/17/16 0543 05/18/16 0400  WBC 11.3* 15.1* 15.0* 13.4* 12.1*  NEUTROABS 7.7  --   --   --   --   HGB 16.6 15.1 14.5 11.9* 12.1*  HCT 47.3 43.4 43.4 35.2* 35.4*  MCV 91.8 92.9 93.3 94.9 94.1  PLT 233 188 197 165 [id number]   Basic Metabolic Panel:  Recent Labs Lab 05/14/16 1512 05/14/16 2306 05/15/16 0328 05/17/16 0333 05/18/16 0400 05/19/16 0439  NA 139  --  140 138 138 136  K 3.7  --  4.1 3.6 3.2* 3.5  CL 107  --  110 107 101 103  CO2 23  --  24 26 27 26   GLUCOSE 128*  --  128* 94 99 108*  BUN 11  --  13 12 10 10   CREATININE 0.80 0.86 0.82 0.58* 0.58* 0.65  CALCIUM 9.4  --  8.2* 7.9* 8.2* 8.4*  MG  --   --   --   --   --  2.0   GFR: Estimated Creatinine Clearance: 83.8 mL/min (by C-G formula based on SCr of 0.8 mg/dL). Liver Function Tests:  Recent Labs Lab 05/14/16 1512 05/15/16 0328 05/17/16 0333 05/18/16 0400  AST 147* 97* 62* 45*  ALT 346* 262* 151* 126*  ALKPHOS 291* 241* 198* 193*  BILITOT 4.9* 4.1* 4.0* 2.3*  PROT 7.6 6.4* 5.6* 6.0*  ALBUMIN 4.4 3.8 2.9* 3.1*    Recent Labs Lab 05/14/16 1512 05/16/16 0409 05/17/16 0333 05/18/16 0400  LIPASE 2,628* 130* 42 21   No results for input(s): AMMONIA in the last 168 hours. Coagulation Profile:  Recent Labs Lab 05/14/16 1518  INR 0.97   Cardiac Enzymes: No results for input(s): CKTOTAL, CKMB, CKMBINDEX, TROPONINI in the last 168 hours. BNP (last 3 results) No results for input(s): PROBNP in the last 8760 hours. HbA1C: No results for input(s): HGBA1C in the last 72 hours. CBG: No results for input(s): GLUCAP in the last 168 hours. Lipid Profile: No results for input(s): CHOL, HDL, LDLCALC, TRIG, CHOLHDL, LDLDIRECT in the last 72 hours. Thyroid Function Tests: No results for input(s): TSH, T4TOTAL, FREET4, T3FREE, THYROIDAB in the last 72 hours. Anemia Panel: No results for input(s): VITAMINB12, FOLATE, FERRITIN, TIBC, IRON, RETICCTPCT in the last 72 hours. Urine  analysis:    Component Value Date/Time   COLORURINE ORANGE (A) 05/14/2016 1610   APPEARANCEUR CLEAR 05/14/2016 1610   LABSPEC 1.014 05/14/2016 1610   PHURINE 5.0 05/14/2016 1610   GLUCOSEU NEGATIVE 05/14/2016 1610   HGBUR NEGATIVE 05/14/2016 1610   BILIRUBINUR LARGE (A) 05/14/2016 1610   KETONESUR NEGATIVE 05/14/2016 1610   PROTEINUR NEGATIVE 05/14/2016 1610   NITRITE NEGATIVE 05/14/2016 1610   LEUKOCYTESUR TRACE (A) 05/14/2016 1610   Sepsis Labs: Invalid input(s): PROCALCITONIN, LACTICIDVEN  Recent Results (from the past 240 hour(s))  Blood culture (  routine x 2)     Status: None (Preliminary result)   Collection Time: 05/14/16  6:34 PM  Result Value Ref Range Status   Specimen Description BLOOD RIGHT ANTECUBITAL  Final   Special Requests BOTTLES DRAWN AEROBIC AND ANAEROBIC 5CC  Final   Culture   Final    NO GROWTH 3 DAYS Performed at [facility]    Report Status PENDING  Incomplete  Blood culture (routine x 2)     Status: None (Preliminary result)   Collection Time: 05/14/16  6:35 PM  Result Value Ref Range Status   Specimen Description BLOOD LEFT ANTECUBITAL  Final   Special Requests BOTTLES DRAWN AEROBIC AND ANAEROBIC 5CC  Final   Culture   Final    NO GROWTH 3 DAYS Performed at [facility]    Report Status PENDING  Incomplete  MRSA PCR Screening     Status: None   Collection Time: 05/14/16 10:42 PM  Result Value Ref Range Status   MRSA by PCR NEGATIVE NEGATIVE Final    Comment:        The GeneXpert MRSA Assay (FDA approved for NASAL specimens only), is one component of a comprehensive MRSA colonization surveillance program. It is not intended to diagnose MRSA infection nor to guide or monitor treatment for MRSA infections.       Radiology Studies: Dg Chest Port 1 View  Result Date: 05/17/2016 CLINICAL DATA:  Dyspnea for 2-3 days, smoker EXAM: PORTABLE CHEST 1 VIEW COMPARISON:  Portable exam 1725 hours compared to 05/14/2016 FINDINGS:  Normal heart size, mediastinal contours, and pulmonary vascularity. Underlying emphysematous changes. Atelectasis versus scarring at LEFT base. Increased RIGHT basilar opacity question infiltrate. Potential fluid within the lateral aspect of the minor fissure. Upper lungs emphysematous but clear. No pneumothorax or acute osseous findings. IMPRESSION: COPD changes with LEFT basilar scarring. RIGHT basilar infiltrate and suspect small pleural effusion. Electronically Signed   By: Lavonia Dana M.D.   On: 05/17/2016 17:54     Scheduled Meds: . heparin subcutaneous  5,000 Units Subcutaneous Q8H  . piperacillin-tazobactam (ZOSYN)  IV  3.375 g Intravenous Q8H  . sodium chloride flush  3 mL Intravenous Q12H   Continuous Infusions:     Birdie Hopes, MD [facility] Pager [phone]  If 7PM-7AM, please contact night-coverage [email or web address] Password [email or web address] 05/19/2016, 12:12 PM

## 2016-05-19 NOTE — Progress Notes (Signed)
3 Days Post-Op  Subjective: Less epigastric pain.  Still requiring oxygen Objective: Vital signs in last 24 hours: Temp:  [97.7 F (36.5 C)-98.8 F (37.1 C)] 98.4 F (36.9 C) (08/03 0507) Pulse Rate:  [69-77] 69 (08/03 0507) Resp:  [16] 16 (08/03 0507) BP: (146-158)/(84-92) 156/84 (08/03 0507) SpO2:  [93 %-96 %] 94 % (08/03 0507) Weight:  [75.2 kg (165 lb 12.6 oz)] 75.2 kg (165 lb 12.6 oz) (08/03 0507) Last BM Date: 05/18/16  Intake/Output from previous day: 08/02 0701 - 08/03 0700 In: 707 [P.O.:582; IV Piggyback:100] Out: 3875 [Urine:3875] Intake/Output this shift: No intake/output data recorded.  PE: General- In NAD  Abdomen-soft, not tender in epigastrium  Lab Results:   Recent Labs  05/17/16 0543 05/18/16 0400  WBC 13.4* 12.1*  HGB 11.9* 12.1*  HCT 35.2* 35.4*  PLT 165 186   BMET  Recent Labs  05/18/16 0400 05/19/16 0439  NA 138 136  K 3.2* 3.5  CL 101 103  CO2 27 26  GLUCOSE 99 108*  BUN 10 10  CREATININE 0.58* 0.65  CALCIUM 8.2* 8.4*   PT/INR No results for input(s): LABPROT, INR in the last 72 hours. Comprehensive Metabolic Panel:    Component Value Date/Time   NA 136 05/19/2016 0439   NA 138 05/18/2016 0400   K 3.5 05/19/2016 0439   K 3.2 (L) 05/18/2016 0400   CL 103 05/19/2016 0439   CL 101 05/18/2016 0400   CO2 26 05/19/2016 0439   CO2 27 05/18/2016 0400   BUN 10 05/19/2016 0439   BUN 10 05/18/2016 0400   CREATININE 0.65 05/19/2016 0439   CREATININE 0.58 (L) 05/18/2016 0400   GLUCOSE 108 (H) 05/19/2016 0439   GLUCOSE 99 05/18/2016 0400   CALCIUM 8.4 (L) 05/19/2016 0439   CALCIUM 8.2 (L) 05/18/2016 0400   AST 45 (H) 05/18/2016 0400   AST 62 (H) 05/17/2016 0333   ALT 126 (H) 05/18/2016 0400   ALT 151 (H) 05/17/2016 0333   ALKPHOS 193 (H) 05/18/2016 0400   ALKPHOS 198 (H) 05/17/2016 0333   BILITOT 2.3 (H) 05/18/2016 0400   BILITOT 4.0 (H) 05/17/2016 0333   PROT 6.0 (L) 05/18/2016 0400   PROT 5.6 (L) 05/17/2016 0333   ALBUMIN  3.1 (L) 05/18/2016 0400   ALBUMIN 2.9 (L) 05/17/2016 0333     Studies/Results: Dg Chest Port 1 View  Result Date: 05/17/2016 CLINICAL DATA:  Dyspnea for 2-3 days, smoker EXAM: PORTABLE CHEST 1 VIEW COMPARISON:  Portable exam 1725 hours compared to 05/14/2016 FINDINGS: Normal heart size, mediastinal contours, and pulmonary vascularity. Underlying emphysematous changes. Atelectasis versus scarring at LEFT base. Increased RIGHT basilar opacity question infiltrate. Potential fluid within the lateral aspect of the minor fissure. Upper lungs emphysematous but clear. No pneumothorax or acute osseous findings. IMPRESSION: COPD changes with LEFT basilar scarring. RIGHT basilar infiltrate and suspect small pleural effusion. Electronically Signed   By: Lavonia Dana M.D.   On: 05/17/2016 17:54    Anti-infectives: Anti-infectives    Start     Dose/Rate Route Frequency Ordered Stop   05/15/16 0000  piperacillin-tazobactam (ZOSYN) IVPB 3.375 g     3.375 g 12.5 mL/hr over 240 Minutes Intravenous Every 8 hours 05/14/16 1816     05/14/16 1815  piperacillin-tazobactam (ZOSYN) IVPB 3.375 g     3.375 g 100 mL/hr over 30 Minutes Intravenous  Once 05/14/16 1810 05/14/16 1915      Assessment   Gallstone pancreatitis-improving    Choledocholithiasis-s/p ERCP and stone extraction  RLL infiltrate on CXR with hypoxia during walking suggests PNA.   LOS: 5 days   Plan: Lowfat diet.  Will plan on outpatient cholecystectomy once pneumonia/pulmonary issues are cleared up.    Amai Cappiello J 05/19/2016

## 2016-05-19 NOTE — Anesthesia Postprocedure Evaluation (Signed)
Anesthesia Post Note  Patient: Gerald Odom  Procedure(s) Performed: Procedure(s) (LRB): ENDOSCOPIC RETROGRADE CHOLANGIOPANCREATOGRAPHY (ERCP) (N/A)  Patient location during evaluation: PACU Anesthesia Type: General Level of consciousness: sedated Pain management: satisfactory to patient Vital Signs Assessment: post-procedure vital signs reviewed and stable Respiratory status: spontaneous breathing Cardiovascular status: stable Anesthetic complications: no    Last Vitals:  Vitals:   05/18/16 2129 05/19/16 0507  BP: (!) 158/92 (!) 156/84  Pulse: 74 69  Resp: 16 16  Temp: 37.1 C 36.9 C    Last Pain:  Vitals:   05/19/16 0825  TempSrc:   PainSc: 0-No pain                 Shakera Ebrahimi EDWARD

## 2016-05-20 ENCOUNTER — Inpatient Hospital Stay (HOSPITAL_COMMUNITY): Payer: PPO

## 2016-05-20 DIAGNOSIS — K219 Gastro-esophageal reflux disease without esophagitis: Secondary | ICD-10-CM | POA: Diagnosis not present

## 2016-05-20 DIAGNOSIS — R0602 Shortness of breath: Secondary | ICD-10-CM | POA: Diagnosis not present

## 2016-05-20 DIAGNOSIS — I509 Heart failure, unspecified: Secondary | ICD-10-CM

## 2016-05-20 DIAGNOSIS — K8050 Calculus of bile duct without cholangitis or cholecystitis without obstruction: Secondary | ICD-10-CM | POA: Diagnosis not present

## 2016-05-20 DIAGNOSIS — K830 Cholangitis: Secondary | ICD-10-CM | POA: Diagnosis not present

## 2016-05-20 DIAGNOSIS — K851 Biliary acute pancreatitis without necrosis or infection: Secondary | ICD-10-CM | POA: Diagnosis not present

## 2016-05-20 LAB — COMPREHENSIVE METABOLIC PANEL
ALT: 81 U/L — ABNORMAL HIGH (ref 17–63)
AST: 30 U/L (ref 15–41)
Albumin: 3.3 g/dL — ABNORMAL LOW (ref 3.5–5.0)
Alkaline Phosphatase: 156 U/L — ABNORMAL HIGH (ref 38–126)
Anion gap: 10 (ref 5–15)
BUN: 13 mg/dL (ref 6–20)
CHLORIDE: 105 mmol/L (ref 101–111)
CO2: 23 mmol/L (ref 22–32)
Calcium: 9 mg/dL (ref 8.9–10.3)
Creatinine, Ser: 0.85 mg/dL (ref 0.61–1.24)
GFR calc Af Amer: >60 mL/min (ref >60)
Glucose, Bld: 118 mg/dL — ABNORMAL HIGH (ref 65–99)
POTASSIUM: 3.7 mmol/L (ref 3.5–5.1)
SODIUM: 138 mmol/L (ref 135–145)
Total Bilirubin: 1.7 mg/dL — ABNORMAL HIGH (ref 0.3–1.2)
Total Protein: 6.5 g/dL (ref 6.5–8.1)

## 2016-05-20 LAB — CBC
HCT: 40.5 % (ref 39.0–52.0)
Hemoglobin: 13.8 g/dL (ref 13.0–17.0)
MCH: 31.2 pg (ref 26.0–34.0)
MCHC: 34.1 g/dL (ref 30.0–36.0)
MCV: 91.4 fL (ref 78.0–100.0)
PLATELETS: 246 10*3/uL (ref 150–400)
RBC: 4.43 MIL/uL (ref 4.22–5.81)
RDW: 13.6 % (ref 11.5–15.5)
WBC: 10.4 10*3/uL (ref 4.0–10.5)

## 2016-05-20 LAB — ECHOCARDIOGRAM COMPLETE
HEIGHTINCHES: 67 in
WEIGHTICAEL: 2543.23 [oz_av]

## 2016-05-20 LAB — CULTURE, BLOOD (ROUTINE X 2)
CULTURE: NO GROWTH
CULTURE: NO GROWTH

## 2016-05-20 MED ORDER — LISINOPRIL 5 MG PO TABS: 5.0000 mg | ORAL_TABLET | Freq: Every day | ORAL | 0 refills | Status: DC

## 2016-05-20 MED ORDER — AMOXICILLIN-POT CLAVULANATE 875-125 MG PO TABS: 1.0000 | ORAL_TABLET | Freq: Two times a day (BID) | ORAL | 0 refills | Status: DC

## 2016-05-20 NOTE — Progress Notes (Signed)
4 Days Post-Op  Subjective: Feeling better overall.  Objective: Vital signs in last 24 hours: Temp:  [97.8 F (36.6 C)-98.4 F (36.9 C)] 98.4 F (36.9 C) (08/04 0413) Pulse Rate:  [65-71] 67 (08/04 0413) Resp:  [18-20] 18 (08/04 0413) BP: (142-160)/(78-99) 142/78 (08/04 0413) SpO2:  [94 %-97 %] 94 % (08/04 0413) Weight:  [72.1 kg (158 lb 15.2 oz)] 72.1 kg (158 lb 15.2 oz) (08/04 0413) Last BM Date: 05/19/16  Intake/Output from previous day: 08/03 0701 - 08/04 0700 In: 530 [P.O.:480; IV Piggyback:50] Out: 2180 [Urine:2180] Intake/Output this shift: No intake/output data recorded.  PE: General- In NAD Abdomen-  Lab Results:   Recent Labs  05/18/16 0400 05/20/16 0410  WBC 12.1* 10.4  HGB 12.1* 13.8  HCT 35.4* 40.5  PLT 186 246   BMET  Recent Labs  05/19/16 0439 05/20/16 0410  NA 136 138  K 3.5 3.7  CL 103 105  CO2 26 23  GLUCOSE 108* 118*  BUN 10 13  CREATININE 0.65 0.85  CALCIUM 8.4* 9.0   PT/INR No results for input(s): LABPROT, INR in the last 72 hours. Comprehensive Metabolic Panel:    Component Value Date/Time   NA 138 05/20/2016 0410   NA 136 05/19/2016 0439   K 3.7 05/20/2016 0410   K 3.5 05/19/2016 0439   CL 105 05/20/2016 0410   CL 103 05/19/2016 0439   CO2 23 05/20/2016 0410   CO2 26 05/19/2016 0439   BUN 13 05/20/2016 0410   BUN 10 05/19/2016 0439   CREATININE 0.85 05/20/2016 0410   CREATININE 0.65 05/19/2016 0439   GLUCOSE 118 (H) 05/20/2016 0410   GLUCOSE 108 (H) 05/19/2016 0439   CALCIUM 9.0 05/20/2016 0410   CALCIUM 8.4 (L) 05/19/2016 0439   AST 30 05/20/2016 0410   AST 45 (H) 05/18/2016 0400   ALT 81 (H) 05/20/2016 0410   ALT 126 (H) 05/18/2016 0400   ALKPHOS 156 (H) 05/20/2016 0410   ALKPHOS 193 (H) 05/18/2016 0400   BILITOT 1.7 (H) 05/20/2016 0410   BILITOT 2.3 (H) 05/18/2016 0400   PROT 6.5 05/20/2016 0410   PROT 6.0 (L) 05/18/2016 0400   ALBUMIN 3.3 (L) 05/20/2016 0410   ALBUMIN 3.1 (L) 05/18/2016 0400      Studies/Results: No results found.  Anti-infectives: Anti-infectives    Start     Dose/Rate Route Frequency Ordered Stop   05/15/16 0000  piperacillin-tazobactam (ZOSYN) IVPB 3.375 g     3.375 g 12.5 mL/hr over 240 Minutes Intravenous Every 8 hours 05/14/16 1816     05/14/16 1815  piperacillin-tazobactam (ZOSYN) IVPB 3.375 g     3.375 g 100 mL/hr over 30 Minutes Intravenous  Once 05/14/16 1810 05/14/16 1915      Assessment Gallstone pancreatitis-continues to improve.    Choledocholithiasis-s/p ERCP and stone extraction    RLL infiltrate on CXR  suggests PNA.   LOS: 5 days   Plan: Follow up with me in 2 weeks in office to schedule elective cholecystectomy.   LOS: 6 days   Plan:    Gerald Odom 05/20/2016

## 2016-05-20 NOTE — Discharge Instructions (Signed)
Strict lowfat diet as discussed.  Appointment with Dr. Zella Richer (surgeon) in 2 weeks.  Call [phone] to make appointment.

## 2016-05-20 NOTE — Progress Notes (Signed)
  Echocardiogram 2D Echocardiogram has been performed.  Jennette Dubin 05/20/2016, 11:55 AM

## 2016-05-20 NOTE — Plan of Care (Signed)
Problem: Safety: Goal: Ability to remain free from injury will improve Outcome: Progressing Patient safely transfer self and ambulates without difficulty.

## 2016-05-20 NOTE — Discharge Summary (Signed)
Physician Discharge Summary  Gerald Odom [id number] DOB: 1950/12/31 DOA: 05/14/2016  PCP: No primary care provider on file.  Admit date: 05/14/2016 Discharge date: 05/20/2016  Admitted From: Home Disposition: Home  Recommendations for Outpatient Follow-up:  1. Follow up with Dr. Zella Richer in 1-2 weeks  Home Health: No Equipment/Devices: NA  Discharge Condition: Stable CODE STATUS: Full Diet recommendation: Heart Healthy  Brief/Interim Summary: Gerald Odom  is a 65 y.o. male, with H/O gout otherwise healthy who came to the ER for sudden onset right upper quadrant and epigastric abdominal pain radiating to his back, pain is constant, worse by eating food better with rest, associated with nausea vomiting, denies any fever chills, denies any similar problems before. He came to the ER for this complaint in the ER he had lab work suggestive of gallstone pancreatitis with possible CBD stone with elevated lipase and liver enzymes. Right upper quadrant ultrasound was consistent with gallstones and possible acute cholecystitis. Gen. surgery and GI were consulted by ER and I was called to admit.  Discharge Diagnoses:  Principal Problem:   Gall stone pancreatitis Active Problems:   Gout   GERD (gastroesophageal reflux disease)   Acute cholangitis   Gallstone pancreatitis   Choledocholithiasis   Gallstone pancreatitis with possible acute cholecystitis - MRI confirms stones in the CBD - Gen. Surgery and gastroenterology following - Status post ERCP on 7/31, when choledocholithiasis was found, status post complete removal and with biliary sphincterotomy and balloon extraction - Cholecystectomy postponed because of shortness of breath and hypoxia. - Acute pancreatitis resolved, patient tolerating diet without any problems. - Patient to follow-up with Dr. Zella Richer as outpatient for consideration of elective cholecystectomy.  Acute respiratory failure with hypoxia -Was hypoxic and  required 3 L of oxygen to keep oxygen saturation around 93%. -CXR showed right-sided infiltrates and minimal effusion. CXR repeated on day of discharge showed improvement. -Was already on Zosyn, added Mucinex, unlikely to be PE because of lack of chest pain and tachycardia. -Acute respiratory failure likely secondary to iatrogenic fluid overload from aggressive IV fluid hydration for the acute pancreatitis. -This is resolved with aggressive diuresis, currently on room air.  Acute pancreatitis - This is improving, presented with lipase of >2300, lipase is back to normal. -Acute pancreatitis resolved, patient tolerating diet without nausea or abdominal pain.  Hypertension, essential -Blood pressure was consistently elevated during this hospital stay, started on 5 mg of lisinopril on discharge. -To follow-up with primary care physician for further adjustment.  History of gout - Stable, no acute issues  Hypokalemia -This is secondary to diuresis, replete with oral supplements.  Discharge Instructions  Discharge Instructions    Diet - low sodium heart healthy    Complete by:  As directed   Increase activity slowly    Complete by:  As directed       Medication List    TAKE these medications   allopurinol 300 MG tablet Commonly known as:  ZYLOPRIM Take 300 mg by mouth daily.   amoxicillin-clavulanate 875-125 MG tablet Commonly known as:  AUGMENTIN Take 1 tablet by mouth 2 (two) times daily.   CHANTIX 1 MG tablet Generic drug:  varenicline Take 1 mg by mouth 2 (two) times daily.   colchicine 0.6 MG tablet Take 0.6 mg by mouth daily.   lisinopril 5 MG tablet Commonly known as:  PRINIVIL,ZESTRIL Take 1 tablet (5 mg total) by mouth daily.   meloxicam 15 MG tablet Commonly known as:  MOBIC Take 15 mg by mouth  daily.   pantoprazole 40 MG tablet Commonly known as:  PROTONIX Take 40 mg by mouth daily.      Follow-up Information    ROSENBOWER,TODD J, MD Follow up in 1  week(s).   Specialty:  General Surgery Contact information: [place] [address] [phone]          No Known Allergies  Consultations:  Gen Surgery.   Procedures/Studies: Dg Chest 2 View  Result Date: 05/20/2016 CLINICAL DATA:  Shortness of breath EXAM: CHEST  2 VIEW COMPARISON:  May 17, 2016 FINDINGS: There is scarring in both lower lung zones. Lungs elsewhere are clear. Heart size and pulmonary vascularity are normal. No adenopathy. There is atherosclerotic calcification in the aorta. No bone lesions are evident. IMPRESSION: Scarring in both lower lung zones. No edema or consolidation. Aortic atherosclerosis. Electronically Signed   By: Lowella Grip III M.D.   On: 05/20/2016 09:21   Mr Abdomen Mrcp Wo Cm  Result Date: 05/14/2016 CLINICAL DATA:  65 year old male with gallstone pancreatitis. History of gout. Patient presenting with abdominal pain radiating to the back. EXAM: MRI ABDOMEN WITHOUT CONTRAST  (INCLUDING MRCP) TECHNIQUE: Multiplanar multisequence MR imaging of the abdomen was performed. Heavily T2-weighted images of the biliary and pancreatic ducts were obtained, and three-dimensional MRCP images were rendered by post processing. COMPARISON:  Abdominal ultrasound dated 05/14/2016 FINDINGS: Evaluation of this exam is limited due to respiratory motion artifact. There is slight signal dropout on the out of phase images suggestive of mild underlying fatty infiltration. There is a focal area higher signal intensity in the left lobe of the liver on the out of phase images (segment IV B) compatible with fatty spurring. No focal hepatic lesions identified. There is mild biliary ductal dilatation. The gallbladder is distended. There are multiple small stones within the gallbladder as well as within the gallbladder neck. A string of adjacent small stones noted within the central CBD at the head of the pancreas. The common bile duct measures approximately 8  mm in diameter. The pancreas is inflamed. There small amount of fluid surrounding the pancreas and extending to the lower abdomen Create there is no dilatation of the main pancreatic duct. There is no drainable fluid collection or abscess or pseudocyst. The spleen appears unremarkable. The visualized aorta and IVC appear unremarkable. There is a small ascites. IMPRESSION: Gallstone pancreatitis. Multiple small stones noted in the central CBD. No abscess or pseudocyst. Fatty liver. Electronically Signed   By: Anner Crete M.D.   On: 05/14/2016 22:10  Mr 3d Recon At Scanner  Result Date: 05/14/2016 CLINICAL DATA:  65 year old male with gallstone pancreatitis. History of gout. Patient presenting with abdominal pain radiating to the back. EXAM: MRI ABDOMEN WITHOUT CONTRAST  (INCLUDING MRCP) TECHNIQUE: Multiplanar multisequence MR imaging of the abdomen was performed. Heavily T2-weighted images of the biliary and pancreatic ducts were obtained, and three-dimensional MRCP images were rendered by post processing. COMPARISON:  Abdominal ultrasound dated 05/14/2016 FINDINGS: Evaluation of this exam is limited due to respiratory motion artifact. There is slight signal dropout on the out of phase images suggestive of mild underlying fatty infiltration. There is a focal area higher signal intensity in the left lobe of the liver on the out of phase images (segment IV B) compatible with fatty spurring. No focal hepatic lesions identified. There is mild biliary ductal dilatation. The gallbladder is distended. There are multiple small stones within the gallbladder as well as within the gallbladder neck. A string of  adjacent small stones noted within the central CBD at the head of the pancreas. The common bile duct measures approximately 8 mm in diameter. The pancreas is inflamed. There small amount of fluid surrounding the pancreas and extending to the lower abdomen Create there is no dilatation of the main pancreatic duct.  There is no drainable fluid collection or abscess or pseudocyst. The spleen appears unremarkable. The visualized aorta and IVC appear unremarkable. There is a small ascites. IMPRESSION: Gallstone pancreatitis. Multiple small stones noted in the central CBD. No abscess or pseudocyst. Fatty liver. Electronically Signed   By: Anner Crete M.D.   On: 05/14/2016 22:10  Dg Chest Port 1 View  Result Date: 05/17/2016 CLINICAL DATA:  Dyspnea for 2-3 days, smoker EXAM: PORTABLE CHEST 1 VIEW COMPARISON:  Portable exam 1725 hours compared to 05/14/2016 FINDINGS: Normal heart size, mediastinal contours, and pulmonary vascularity. Underlying emphysematous changes. Atelectasis versus scarring at LEFT base. Increased RIGHT basilar opacity question infiltrate. Potential fluid within the lateral aspect of the minor fissure. Upper lungs emphysematous but clear. No pneumothorax or acute osseous findings. IMPRESSION: COPD changes with LEFT basilar scarring. RIGHT basilar infiltrate and suspect small pleural effusion. Electronically Signed   By: Lavonia Dana M.D.   On: 05/17/2016 17:54   Dg Chest Port 1 View  Result Date: 05/14/2016 CLINICAL DATA:  Shortness of breath, central abdominal pain and right lower back pain since Thursday. Pain worse today. EXAM: PORTABLE CHEST 1 VIEW COMPARISON:  None. FINDINGS: Heart size is normal. Overall cardiomediastinal silhouette is within normal limits in size and configuration. Probable fibrosis and/or emphysematous change within the upper lobes bilaterally. Questionable fibrosis at each lung base. No confluent opacity to suggest a developing pneumonia. No pleural effusion or pneumothorax seen. Osseous structures about the chest are unremarkable. IMPRESSION: 1. No acute findings. No evidence of pneumonia. Heart size is normal. 2. Suspect fibrosis and/or emphysematous change within the upper lobes bilaterally. Questionable fibrosis at each lung base. Electronically Signed   By: Franki Cabot  M.D.   On: 05/14/2016 19:18  Dg Ercp Biliary & Pancreatic Ducts  Result Date: 05/16/2016 CLINICAL DATA:  65 year old male with a history of gallstone pancreatitis. EXAM: ERCP TECHNIQUE: Multiple spot images obtained with the fluoroscopic device and submitted for interpretation post-procedure. FLUOROSCOPY TIME:  6 minutes, 38 seconds COMPARISON:  MR 05/14/2016 FINDINGS: Multiple intraoperative fluoroscopic spot images during ERCP. Initial image demonstrates endoscope over the upper abdomen with cannulation of the ampulla and retrograde infusion of contrast. Incomplete opacification of the extrahepatic biliary ducts. Images demonstrate rounded filling defects of the distal common bile duct just above the ampulla, compatible with findings on prior MR. Balloon basket present on the images. IMPRESSION: Limited images during ERCP demonstrate ill-defined filling defects in the distal common bile duct, compatible with choledocholithiasis evident on prior MRI, as well as utilization of balloon basket endoscopically. Please refer to the dictated operative report for full details of intraoperative findings and procedure. Signed, Dulcy Fanny. Earleen Newport, DO [facility] Electronically Signed   By: Corrie Mckusick D.O.   On: 05/16/2016 14:33   US Abdomen Limited Ruq  Result Date: 05/14/2016 CLINICAL DATA:  65 year old male with severe acute right upper quadrant abdominal pain for 2 days. EXAM: US ABDOMEN LIMITED - RIGHT UPPER QUADRANT COMPARISON:  None. FINDINGS: Gallbladder: The gallbladder is distended filled with sludge and gallstones, the largest measuring 7 mm. Small amount of pericholecystic fluid is noted with equivocal sonographic Murphy's sign. Common bile duct:  Diameter: 7 mm.  Mild intrahepatic biliary dilatation identified. Liver: Slight increased echogenicity noted. An ill-defined 1.5 cm hypoechoic area within the central liver is indeterminate. IMPRESSION:  Distended gallbladder with sludge and cholelithiasis. Given pericholecystic fluid and equivocal sonographic Murphy's sign, this is suspicious for acute cholecystitis. Mild intrahepatic biliary dilatation. Probable hepatic steatosis. 1.5 cm hypoechoic area within the central liver is indeterminate. Consider elective MR evaluation. Electronically Signed   By: Margarette Canada M.D.   On: 05/14/2016 17:40   (Echo, Carotid, EGD, Colonoscopy, ERCP)    Subjective:   Discharge Exam: Vitals:   05/19/16 2123 05/20/16 0413  BP: (!) 160/87 (!) 142/78  Pulse: 71 67  Resp: 18 18  Temp: 98.2 F (36.8 C) 98.4 F (36.9 C)   Vitals:   05/19/16 1344 05/19/16 1512 05/19/16 2123 05/20/16 0413  BP:  (!) 156/99 (!) 160/87 (!) 142/78  Pulse:  65 71 67  Resp:  20 18 18   Temp:  97.8 F (36.6 C) 98.2 F (36.8 C) 98.4 F (36.9 C)  TempSrc:  Oral Oral Oral  SpO2: 94% 95% 97% 94%  Weight:    72.1 kg (158 lb 15.2 oz)  Height:        General: Pt is alert, awake, not in acute distress Cardiovascular: RRR, S1/S2 +, no rubs, no gallops Respiratory: CTA bilaterally, no wheezing, no rhonchi Abdominal: Soft, NT, ND, bowel sounds + Extremities: no edema, no cyanosis    The results of significant diagnostics from this hospitalization (including imaging, microbiology, ancillary and laboratory) are listed below for reference.     Microbiology: Recent Results (from the past 240 hour(s))  Blood culture (routine x 2)     Status: None   Collection Time: 05/14/16  6:34 PM  Result Value Ref Range Status   Specimen Description BLOOD RIGHT ANTECUBITAL  Final   Special Requests BOTTLES DRAWN AEROBIC AND ANAEROBIC 5CC  Final   Culture   Final    NO GROWTH 5 DAYS Performed at [facility]    Report Status 05/20/2016 FINAL  Final  Blood culture (routine x 2)     Status: None   Collection Time: 05/14/16  6:35 PM  Result Value Ref Range Status   Specimen Description BLOOD LEFT ANTECUBITAL  Final   Special  Requests BOTTLES DRAWN AEROBIC AND ANAEROBIC 5CC  Final   Culture   Final    NO GROWTH 5 DAYS Performed at [facility]    Report Status 05/20/2016 FINAL  Final  MRSA PCR Screening     Status: None   Collection Time: 05/14/16 10:42 PM  Result Value Ref Range Status   MRSA by PCR NEGATIVE NEGATIVE Final    Comment:        The GeneXpert MRSA Assay (FDA approved for NASAL specimens only), is one component of a comprehensive MRSA colonization surveillance program. It is not intended to diagnose MRSA infection nor to guide or monitor treatment for MRSA infections.      Labs: BNP (last 3 results) No results for input(s): BNP in the last 8760 hours. Basic Metabolic Panel:  Recent Labs Lab 05/15/16 0328 05/17/16 0333 05/18/16 0400 05/19/16 0439 05/20/16 0410  NA 140 138 138 136 138  K 4.1 3.6 3.2* 3.5 3.7  CL 110 107 101 103 105  CO2 24 26 27 26 23   GLUCOSE 128* 94 99 108* 118*  BUN 13 12 10 10 13   CREATININE 0.82 0.58* 0.58* 0.65 0.85  CALCIUM 8.2* 7.9* 8.2* 8.4*  9.0  MG  --   --   --  2.0  --    Liver Function Tests:  Recent Labs Lab 05/14/16 1512 05/15/16 0328 05/17/16 0333 05/18/16 0400 05/20/16 0410  AST 147* 97* 62* 45* 30  ALT 346* 262* 151* 126* 81*  ALKPHOS 291* 241* 198* 193* 156*  BILITOT 4.9* 4.1* 4.0* 2.3* 1.7*  PROT 7.6 6.4* 5.6* 6.0* 6.5  ALBUMIN 4.4 3.8 2.9* 3.1* 3.3*    Recent Labs Lab 05/14/16 1512 05/16/16 0409 05/17/16 0333 05/18/16 0400  LIPASE 2,628* 130* 42 21   No results for input(s): AMMONIA in the last 168 hours. CBC:  Recent Labs Lab 05/14/16 1510 05/14/16 2306 05/15/16 0328 05/17/16 0543 05/18/16 0400 05/20/16 0410  WBC 11.3* 15.1* 15.0* 13.4* 12.1* 10.4  NEUTROABS 7.7  --   --   --   --   --   HGB 16.6 15.1 14.5 11.9* 12.1* 13.8  HCT 47.3 43.4 43.4 35.2* 35.4* 40.5  MCV 91.8 92.9 93.3 94.9 94.1 91.4  PLT 233 188 197 165 186 246   Cardiac Enzymes: No results for input(s): CKTOTAL, CKMB, CKMBINDEX,  TROPONINI in the last 168 hours. BNP: Invalid input(s): POCBNP CBG: No results for input(s): GLUCAP in the last 168 hours. D-Dimer No results for input(s): DDIMER in the last 72 hours. Hgb A1c No results for input(s): HGBA1C in the last 72 hours. Lipid Profile No results for input(s): CHOL, HDL, LDLCALC, TRIG, CHOLHDL, LDLDIRECT in the last 72 hours. Thyroid function studies No results for input(s): TSH, T4TOTAL, T3FREE, THYROIDAB in the last 72 hours.  Invalid input(s): FREET3 Anemia work up No results for input(s): VITAMINB12, FOLATE, FERRITIN, TIBC, IRON, RETICCTPCT in the last 72 hours. Urinalysis    Component Value Date/Time   COLORURINE ORANGE (A) 05/14/2016 1610   APPEARANCEUR CLEAR 05/14/2016 1610   LABSPEC 1.014 05/14/2016 1610   PHURINE 5.0 05/14/2016 1610   GLUCOSEU NEGATIVE 05/14/2016 1610   HGBUR NEGATIVE 05/14/2016 1610   BILIRUBINUR LARGE (A) 05/14/2016 1610   KETONESUR NEGATIVE 05/14/2016 1610   PROTEINUR NEGATIVE 05/14/2016 1610   NITRITE NEGATIVE 05/14/2016 1610   LEUKOCYTESUR TRACE (A) 05/14/2016 1610   Sepsis Labs Invalid input(s): PROCALCITONIN,  WBC,  LACTICIDVEN Microbiology Recent Results (from the past 240 hour(s))  Blood culture (routine x 2)     Status: None   Collection Time: 05/14/16  6:34 PM  Result Value Ref Range Status   Specimen Description BLOOD RIGHT ANTECUBITAL  Final   Special Requests BOTTLES DRAWN AEROBIC AND ANAEROBIC 5CC  Final   Culture   Final    NO GROWTH 5 DAYS Performed at [facility]    Report Status 05/20/2016 FINAL  Final  Blood culture (routine x 2)     Status: None   Collection Time: 05/14/16  6:35 PM  Result Value Ref Range Status   Specimen Description BLOOD LEFT ANTECUBITAL  Final   Special Requests BOTTLES DRAWN AEROBIC AND ANAEROBIC 5CC  Final   Culture   Final    NO GROWTH 5 DAYS Performed at [facility]    Report Status 05/20/2016 FINAL  Final  MRSA PCR Screening     Status: None    Collection Time: 05/14/16 10:42 PM  Result Value Ref Range Status   MRSA by PCR NEGATIVE NEGATIVE Final    Comment:        The GeneXpert MRSA Assay (FDA approved for NASAL specimens only), is one component of a comprehensive MRSA colonization surveillance  program. It is not intended to diagnose MRSA infection nor to guide or monitor treatment for MRSA infections.      Time coordinating discharge: Over 30 minutes  SIGNED:   Birdie Hopes, MD  [facility] 05/20/2016, 1:03 PM Pager   If 7PM-7AM, please contact night-coverage [email or web address] Password [email or web address]

## 2016-05-20 NOTE — Progress Notes (Signed)
Pharmacy Antibiotic Note  Gerald Odom is a 65 y.o. male admitted on 05/14/2016 with intra-abdominal infection, gallstone pancreatitis , and multiple CBD stones on MRCP.  S/p ERCP on 7/31.  Pharmacy is consulted for Zosyn dosing.  Plan: Continue Zosyn 3.375g IV q8h (4 hour infusion time).  Dosage remains stable and need for further dosage adjustment appears unlikely at present.    Will sign off at this time.  Please reconsult if a change in clinical status warrants re-evaluation of dosage.   Height: 5\' 7"  (170.2 cm) Weight: 158 lb 15.2 oz (72.1 kg) IBW/kg (Calculated) : 66.1  Temp (24hrs), Avg:98.1 F (36.7 C), Min:97.8 F (36.6 C), Max:98.4 F (36.9 C)   Recent Labs Lab 05/14/16 2306 05/15/16 0328 05/17/16 0333 05/17/16 0543 05/18/16 0400 05/19/16 0439 05/20/16 0410  WBC 15.1* 15.0*  --  13.4* 12.1*  --  10.4  CREATININE 0.86 0.82 0.58*  --  0.58* 0.65 0.85    Estimated Creatinine Clearance: 78.8 mL/min (by C-G formula based on SCr of 0.85 mg/dL).    No Known Allergies  Antimicrobials this admission: 7/29 Zosyn >>   Dose adjustments this admission:  Microbiology results: 7/29 BCx: ngtd 7/29 MRSA PCR: negative  Thank you for allowing pharmacy to be a part of this patient's care.  Ralene Bathe, PharmD, BCPS 05/20/2016, 10:11 AM  Pager: [phone]

## 2016-05-20 NOTE — Progress Notes (Signed)
Discharge instructions rendered to patient, verbalized understanding. Discussed about the follow up appointment with Dr. Zella Richer in 2 weeks and about his diet, patient has no further questions. He will pick up his meds at his pharmacy of choice. Patient is waiting for spouse to transport him home. Stable. No c/o pain. He signed the d/c papers.

## 2016-05-20 NOTE — Progress Notes (Signed)
Patient refused to be transported down to lobby via wheelchair. Ambulated with wife. Patient is stable.

## 2016-06-01 ENCOUNTER — Ambulatory Visit: Payer: Self-pay | Admitting: General Surgery

## 2016-06-01 DIAGNOSIS — K851 Biliary acute pancreatitis without necrosis or infection: Secondary | ICD-10-CM | POA: Diagnosis not present

## 2016-06-01 NOTE — H&P (Signed)
Pierce Crane. Fitzsimmons 06/01/2016 11:20 AM Location: [facility] Patient #: [id number] DOB: 10/06/1951 Married / Language: English / Race: White Male  History of Present Illness Odis Hollingshead MD; 06/01/2016 11:41 AM) The patient is a 65 year old male.   Note:He was admitted to the hospital with acute gallstone pancreatitis from 05/14/16-05/20/16. He was found to have choledocholithiasis as well and underwent ERCP with sphincterotomy and stone extraction. He continued to have some smoldering pancreatitis which eventually abated. He also developed some pneumonia. Currently, he states he feels good on his restricted diet. He is walking one to 2 miles a day without any shortness of breath.  Other Problems (April Staton, CMA; 06/01/2016 11:20 AM) No pertinent past medical history  Past Surgical History (April Staton, CMA; 06/01/2016 11:20 AM) Knee Surgery Left.  Diagnostic Studies History (April Staton, [place]; 06/01/2016 11:20 AM) Colonoscopy 1-5 years ago  Allergies (April Staton, CMA; 06/01/2016 11:21 AM) No Known Drug Allergies 06/01/2016  Medication History (April Staton, CMA; 06/01/2016 11:22 AM) Allopurinol (100MG  Tablet, Oral) Active. Chantix (0.5MG  Tablet, Oral) Active. Colchicine (0.6MG  Capsule, Oral) Active. Lisinopril (Oral) Specific dose unknown - Active. Meloxicam (Oral) Specific dose unknown - Active. Pantoprazole Sodium (Oral) Specific dose unknown - Active. Medications Reconciled  Social History (April Staton, CMA; 06/01/2016 11:20 AM) Alcohol use Occasional alcohol use. Caffeine use Coffee. No drug use Tobacco use Former smoker.  Family History (April Staton, [place]; 06/01/2016 11:20 AM) Alcohol Abuse Brother. Hypertension Brother. Kidney Disease Brother.     Review of Systems (April Staton CMA; 06/01/2016 11:20 AM) General Not Present- Appetite Loss, Chills, Fatigue, Fever, Night Sweats, Weight Gain and Weight Loss. Skin Not Present- Change  in Wart/Mole, Dryness, Hives, Jaundice, New Lesions, Non-Healing Wounds, Rash and Ulcer. HEENT Not Present- Earache, Hearing Loss, Hoarseness, Nose Bleed, Oral Ulcers, Ringing in the Ears, Seasonal Allergies, Sinus Pain, Sore Throat, Visual Disturbances, Wears glasses/contact lenses and Yellow Eyes. Respiratory Not Present- Bloody sputum, Chronic Cough, Difficulty Breathing, Snoring and Wheezing. Breast Not Present- Breast Mass, Breast Pain, Nipple Discharge and Skin Changes. Cardiovascular Not Present- Chest Pain, Difficulty Breathing Lying Down, Leg Cramps, Palpitations, Rapid Heart Rate, Shortness of Breath and Swelling of Extremities. Gastrointestinal Present- Gets full quickly at meals. Not Present- Abdominal Pain, Bloating, Bloody Stool, Change in Bowel Habits, Chronic diarrhea, Constipation, Difficulty Swallowing, Excessive gas, Hemorrhoids, Indigestion, Nausea, Rectal Pain and Vomiting. Male Genitourinary Not Present- Blood in Urine, Change in Urinary Stream, Frequency, Impotence, Nocturia, Painful Urination, Urgency and Urine Leakage. Musculoskeletal Not Present- Back Pain, Joint Pain, Joint Stiffness, Muscle Pain, Muscle Weakness and Swelling of Extremities. Neurological Not Present- Decreased Memory, Fainting, Headaches, Numbness, Seizures, Tingling, Tremor, Trouble walking and Weakness. Psychiatric Not Present- Anxiety, Bipolar, Change in Sleep Pattern, Depression, Fearful and Frequent crying. Endocrine Not Present- Cold Intolerance, Excessive Hunger, Hair Changes, Heat Intolerance, Hot flashes and New Diabetes. Hematology Not Present- Blood Thinners, Easy Bruising, Excessive bleeding, Gland problems, HIV and Persistent Infections.  Vitals (April Staton CMA; 06/01/2016 11:23 AM) 06/01/2016 11:22 AM Weight: 159.13 lb Height: 67in Height was reported by patient. Body Surface Area: 1.83 m Body Mass Index: 24.92 kg/m  Pulse: 69 (Regular)  P.OX: 95% (Room air) BP: 140/90 (Sitting,  Left Arm, Standard)      Physical Exam Odis Hollingshead MD; 06/01/2016 11:41 AM)  The physical exam findings are as follows: Note:General: WDWN in NAD. Pleasant and cooperative.  HEENT: [place]/AT, no external nasal or ear masses, mucous membranes are moist  EYES: no scleral icterus  ABDOMEN:  Soft, nontender, nondistended, no masses, no organomegaly, active bowel sounds, no scars, no hernias.  ANORECTAL: No fissures. Normal sphincter tone. No masses.  SKIN: No jaundice.  NEUROLOGIC: Alert and oriented, answers questions appropriately, normal gait and station.  PSYCHIATRIC: Normal mood, affect , and behavior.    Assessment & Plan Odis Hollingshead MD; 06/01/2016 11:42 AM)  Renaldo Harrison PANCREATITIS (K85.10) Impression: He has recovered fully from this and his pneumonia.  Plan: Laparoscopic cholecystectomy with cholangiogram. Continue current diet.  I have explained the procedure, risks, and aftercare of cholecystectomy previously.  Jackolyn Confer, MD

## 2016-06-21 DIAGNOSIS — M9901 Segmental and somatic dysfunction of cervical region: Secondary | ICD-10-CM | POA: Diagnosis not present

## 2016-06-21 DIAGNOSIS — M62830 Muscle spasm of back: Secondary | ICD-10-CM | POA: Diagnosis not present

## 2016-06-21 DIAGNOSIS — M5382 Other specified dorsopathies, cervical region: Secondary | ICD-10-CM | POA: Diagnosis not present

## 2016-06-21 DIAGNOSIS — S161XXA Strain of muscle, fascia and tendon at neck level, initial encounter: Secondary | ICD-10-CM | POA: Diagnosis not present

## 2016-06-21 DIAGNOSIS — M9902 Segmental and somatic dysfunction of thoracic region: Secondary | ICD-10-CM | POA: Diagnosis not present

## 2016-06-21 DIAGNOSIS — M9903 Segmental and somatic dysfunction of lumbar region: Secondary | ICD-10-CM | POA: Diagnosis not present

## 2016-06-29 ENCOUNTER — Encounter (HOSPITAL_COMMUNITY)
Admission: RE | Admit: 2016-06-29 | Discharge: 2016-06-29 | Disposition: A | Discharge status: Home / Self Care | Payer: PPO | Source: Ambulatory Visit | Attending: General Surgery | Admitting: General Surgery

## 2016-06-29 ENCOUNTER — Other Ambulatory Visit (HOSPITAL_COMMUNITY): Payer: Self-pay | Admitting: *Deleted

## 2016-06-29 ENCOUNTER — Encounter (HOSPITAL_COMMUNITY): Payer: Self-pay

## 2016-06-29 DIAGNOSIS — Z01812 Encounter for preprocedural laboratory examination: Secondary | ICD-10-CM | POA: Insufficient documentation

## 2016-06-29 DIAGNOSIS — K851 Biliary acute pancreatitis without necrosis or infection: Secondary | ICD-10-CM | POA: Insufficient documentation

## 2016-06-29 HISTORY — DX: Essential (primary) hypertension: I10

## 2016-06-29 HISTORY — DX: Gout, unspecified: M10.9

## 2016-06-29 HISTORY — DX: Pneumonia, unspecified organism: J18.9

## 2016-06-29 HISTORY — DX: Acute pancreatitis without necrosis or infection, unspecified: K85.90

## 2016-06-29 LAB — CBC WITH DIFFERENTIAL/PLATELET
Basophils Absolute: 0 10*3/uL (ref 0.0–0.1)
Basophils Relative: 0 %
Eosinophils Absolute: 0.2 10*3/uL (ref 0.0–0.7)
Eosinophils Relative: 2 %
HEMATOCRIT: 46.4 % (ref 39.0–52.0)
HEMOGLOBIN: 15.9 g/dL (ref 13.0–17.0)
LYMPHS ABS: 2.8 10*3/uL (ref 0.7–4.0)
Lymphocytes Relative: 36 %
MCH: 31.7 pg (ref 26.0–34.0)
MCHC: 34.3 g/dL (ref 30.0–36.0)
MCV: 92.4 fL (ref 78.0–100.0)
MONOS PCT: 9 %
Monocytes Absolute: 0.7 10*3/uL (ref 0.1–1.0)
NEUTROS ABS: 4.2 10*3/uL (ref 1.7–7.7)
NEUTROS PCT: 53 %
Platelets: 254 10*3/uL (ref 150–400)
RBC: 5.02 MIL/uL (ref 4.22–5.81)
RDW: 14 % (ref 11.5–15.5)
WBC: 7.9 10*3/uL (ref 4.0–10.5)

## 2016-06-29 LAB — COMPREHENSIVE METABOLIC PANEL
ALK PHOS: 89 U/L (ref 38–126)
ALT: 32 U/L (ref 17–63)
AST: 23 U/L (ref 15–41)
Albumin: 4 g/dL (ref 3.5–5.0)
Anion gap: 9 (ref 5–15)
BILIRUBIN TOTAL: 1 mg/dL (ref 0.3–1.2)
BUN: 9 mg/dL (ref 6–20)
CALCIUM: 9.7 mg/dL (ref 8.9–10.3)
CO2: 23 mmol/L (ref 22–32)
Chloride: 108 mmol/L (ref 101–111)
Creatinine, Ser: 0.83 mg/dL (ref 0.61–1.24)
Glucose, Bld: 115 mg/dL — ABNORMAL HIGH (ref 65–99)
Potassium: 3.9 mmol/L (ref 3.5–5.1)
Sodium: 140 mmol/L (ref 135–145)
TOTAL PROTEIN: 7 g/dL (ref 6.5–8.1)

## 2016-06-29 MED ORDER — CHLORHEXIDINE GLUCONATE CLOTH 2 % EX PADS: 6.0000 | MEDICATED_PAD | Freq: Once | CUTANEOUS | Status: DC

## 2016-06-29 NOTE — Pre-Procedure Instructions (Signed)
Gerald Odom  06/29/2016      [facility] - [place], [place] - [address] [place] [address] Phone: [phone] Fax: [phone]    Your procedure is scheduled on 07/04/2016  Report to [facility] Admitting at 5:30 A.M.  Call this number if you have problems the morning of surgery:  [phone]   Remember:  Do not eat food or drink liquids after midnight.  On Sunday   Take these medicines the morning of surgery with A SIP OF WATER : allopurinol, colchicine     Do not wear jewelry   Do not wear lotions, powders, or perfumes, or deoderant.     Men may shave face and neck.   Do not bring valuables to the hospital.   [facility] is not responsible for any belongings or valuables.  Contacts, dentures or bridgework may not be worn into surgery.  Leave your suitcase in the car.  After surgery it may be brought to your room.  For patients admitted to the hospital, discharge time will be determined by your treatment team.  Patients discharged the day of surgery will not be allowed to drive home.   Name and phone number of your driver:   Julia - wife  Special instructions:  Special Instructions: [facility] - Preparing for Surgery  Before surgery, you can play an important role.  Because skin is not sterile, your skin needs to be as free of germs as possible.  You can reduce the number of germs on you skin by washing with CHG (chlorahexidine gluconate) soap before surgery.  CHG is an antiseptic cleaner which kills germs and bonds with the skin to continue killing germs even after washing.  Please DO NOT use if you have an allergy to CHG or antibacterial soaps.  If your skin becomes reddened/irritated stop using the CHG and inform your nurse when you arrive at Short Stay.  Do not shave (including legs and underarms) for at least 48 hours prior to the first CHG shower.  You may shave your face.  Please follow these instructions  carefully:   1.  Shower with CHG Soap the night before surgery and the  morning of Surgery.  2.  If you choose to wash your hair, wash your hair first as usual with your  normal shampoo.  3.  After you shampoo, rinse your hair and body thoroughly to remove the  Shampoo.  4.  Use CHG as you would any other liquid soap.  You can apply chg directly to the skin and wash gently with scrungie or a clean washcloth.  5.  Apply the CHG Soap to your body ONLY FROM THE NECK DOWN.    Do not use on open wounds or open sores.  Avoid contact with your eyes, ears, mouth and genitals (private parts).  Wash genitals (private parts)   with your normal soap.  6.  Wash thoroughly, paying special attention to the area where your surgery will be performed.  7.  Thoroughly rinse your body with warm water from the neck down.  8.  DO NOT shower/wash with your normal soap after using and rinsing off   the CHG Soap.  9.  Pat yourself dry with a clean towel.            10.  Wear clean pajamas.            11 .  Place clean sheets on  your bed the night of your first shower and do not sleep with pets.  Day of Surgery  Do not apply any lotions/deodorants the morning of surgery.  Please wear clean clothes to the hospital/surgery center.  Please read over the following fact sheets that you were given. Pain Booklet and Coughing and Deep Breathing

## 2016-06-29 NOTE — Progress Notes (Signed)
Upon entering office for PAT, pt. Reflecting & recalling last hosp encounter., reporting that he had less than favorable care. He has filed a Tourist information centre manager with the help of family but reports the f/u letter from [facility] made him feel as though he was at fault. Pt. Is followed by PCP in [place], PCP- K. Cox.  Pt. Advised to f/u with her because he was started on Lisinopril at discharge in 05/2016 & he has finished the Rx & thought that he didn't need it any further. Pt. Denies all chest complaints, per pt. He feels as though his  breathing is normal & no coughing.  Echo was completed at last hospitalization- results are available in chart.

## 2016-07-02 MED ORDER — CEFAZOLIN SODIUM-DEXTROSE 2-4 GM/100ML-% IV SOLN
2.0000 g | INTRAVENOUS | Status: AC
  Administered 2016-07-04: 2 g via INTRAVENOUS
  Filled 2016-07-02: qty 100

## 2016-07-04 ENCOUNTER — Ambulatory Visit (HOSPITAL_COMMUNITY): Payer: PPO | Admitting: Emergency Medicine

## 2016-07-04 ENCOUNTER — Ambulatory Visit (HOSPITAL_COMMUNITY): Payer: PPO

## 2016-07-04 ENCOUNTER — Encounter (HOSPITAL_COMMUNITY): Payer: Self-pay | Admitting: Anesthesiology

## 2016-07-04 ENCOUNTER — Ambulatory Visit (HOSPITAL_COMMUNITY): Payer: PPO | Admitting: Anesthesiology

## 2016-07-04 ENCOUNTER — Ambulatory Visit (HOSPITAL_COMMUNITY)
Admission: RE | Admit: 2016-07-04 | Discharge: 2016-07-04 | Disposition: A | Discharge status: Home / Self Care | Payer: PPO | Source: Ambulatory Visit | Attending: General Surgery | Admitting: General Surgery

## 2016-07-04 ENCOUNTER — Encounter (HOSPITAL_COMMUNITY)
Admission: RE | Disposition: A | Discharge status: Home / Self Care | Payer: Self-pay | Source: Ambulatory Visit | Attending: General Surgery

## 2016-07-04 DIAGNOSIS — K851 Biliary acute pancreatitis without necrosis or infection: Secondary | ICD-10-CM | POA: Diagnosis not present

## 2016-07-04 DIAGNOSIS — K219 Gastro-esophageal reflux disease without esophagitis: Secondary | ICD-10-CM | POA: Diagnosis not present

## 2016-07-04 DIAGNOSIS — K812 Acute cholecystitis with chronic cholecystitis: Secondary | ICD-10-CM | POA: Diagnosis not present

## 2016-07-04 DIAGNOSIS — I10 Essential (primary) hypertension: Secondary | ICD-10-CM | POA: Diagnosis not present

## 2016-07-04 DIAGNOSIS — Z87891 Personal history of nicotine dependence: Secondary | ICD-10-CM | POA: Insufficient documentation

## 2016-07-04 DIAGNOSIS — K8020 Calculus of gallbladder without cholecystitis without obstruction: Secondary | ICD-10-CM

## 2016-07-04 HISTORY — PX: CHOLECYSTECTOMY: SHX55

## 2016-07-04 SURGERY — LAPAROSCOPIC CHOLECYSTECTOMY WITH INTRAOPERATIVE CHOLANGIOGRAM
Anesthesia: General | Site: Abdomen

## 2016-07-04 MED ORDER — ROCURONIUM BROMIDE 100 MG/10ML IV SOLN
INTRAVENOUS | Status: DC | PRN
  Administered 2016-07-04: 10 mg via INTRAVENOUS
  Administered 2016-07-04: 40 mg via INTRAVENOUS

## 2016-07-04 MED ORDER — DEXAMETHASONE SODIUM PHOSPHATE 10 MG/ML IJ SOLN
INTRAMUSCULAR | Status: DC | PRN
  Administered 2016-07-04: 10 mg via INTRAVENOUS

## 2016-07-04 MED ORDER — BUPIVACAINE-EPINEPHRINE 0.25% -1:200000 IJ SOLN
INTRAMUSCULAR | Status: DC | PRN
  Administered 2016-07-04: 15 mL

## 2016-07-04 MED ORDER — FENTANYL CITRATE (PF) 100 MCG/2ML IJ SOLN
INTRAMUSCULAR | Status: DC | PRN
  Administered 2016-07-04: 50 ug via INTRAVENOUS
  Administered 2016-07-04 (×2): 100 ug via INTRAVENOUS
  Administered 2016-07-04 (×3): 50 ug via INTRAVENOUS

## 2016-07-04 MED ORDER — IOPAMIDOL (ISOVUE-300) INJECTION 61%
INTRAVENOUS | Status: AC
  Filled 2016-07-04: qty 50

## 2016-07-04 MED ORDER — FENTANYL CITRATE (PF) 100 MCG/2ML IJ SOLN
INTRAMUSCULAR | Status: AC
  Filled 2016-07-04: qty 4

## 2016-07-04 MED ORDER — ONDANSETRON HCL 4 MG PO TABS: 4.0000 mg | ORAL_TABLET | ORAL | 0 refills | Status: DC | PRN

## 2016-07-04 MED ORDER — MIDAZOLAM HCL 2 MG/2ML IJ SOLN
INTRAMUSCULAR | Status: AC
  Filled 2016-07-04: qty 2

## 2016-07-04 MED ORDER — SUGAMMADEX SODIUM 200 MG/2ML IV SOLN
INTRAVENOUS | Status: AC
  Filled 2016-07-04: qty 2

## 2016-07-04 MED ORDER — LIDOCAINE 2% (20 MG/ML) 5 ML SYRINGE
INTRAMUSCULAR | Status: AC
  Filled 2016-07-04: qty 5

## 2016-07-04 MED ORDER — OXYCODONE HCL 5 MG PO TABS: 5.0000 mg | ORAL_TABLET | ORAL | 0 refills | Status: DC | PRN

## 2016-07-04 MED ORDER — HEMOSTATIC AGENTS (NO CHARGE) OPTIME
TOPICAL | Status: DC | PRN
  Administered 2016-07-04: 1 via TOPICAL

## 2016-07-04 MED ORDER — 0.9 % SODIUM CHLORIDE (POUR BTL) OPTIME
TOPICAL | Status: DC | PRN
  Administered 2016-07-04: 1000 mL

## 2016-07-04 MED ORDER — MIDAZOLAM HCL 5 MG/5ML IJ SOLN
INTRAMUSCULAR | Status: DC | PRN
  Administered 2016-07-04: 2 mg via INTRAVENOUS

## 2016-07-04 MED ORDER — OXYCODONE HCL 5 MG PO TABS
ORAL_TABLET | ORAL | Status: AC
  Administered 2016-07-04: 10 mg via ORAL
  Filled 2016-07-04: qty 2

## 2016-07-04 MED ORDER — HYDROMORPHONE HCL 1 MG/ML IJ SOLN
INTRAMUSCULAR | Status: AC
  Administered 2016-07-04: 0.5 mg via INTRAVENOUS
  Filled 2016-07-04: qty 1

## 2016-07-04 MED ORDER — HYDROMORPHONE HCL 1 MG/ML IJ SOLN
0.2500 mg | INTRAMUSCULAR | Status: DC | PRN
  Administered 2016-07-04 (×2): 0.5 mg via INTRAVENOUS

## 2016-07-04 MED ORDER — PROMETHAZINE HCL 25 MG/ML IJ SOLN: 6.2500 mg | INTRAMUSCULAR | Status: DC | PRN

## 2016-07-04 MED ORDER — SUGAMMADEX SODIUM 200 MG/2ML IV SOLN
INTRAVENOUS | Status: DC | PRN
  Administered 2016-07-04: 150 mg via INTRAVENOUS

## 2016-07-04 MED ORDER — SODIUM CHLORIDE 0.9 % IR SOLN
Status: DC | PRN
  Administered 2016-07-04: 1000 mL

## 2016-07-04 MED ORDER — PROPOFOL 10 MG/ML IV BOLUS
INTRAVENOUS | Status: AC
Start: 2016-07-04 — End: 2016-07-04
  Filled 2016-07-04: qty 20

## 2016-07-04 MED ORDER — BUPIVACAINE-EPINEPHRINE (PF) 0.25% -1:200000 IJ SOLN
INTRAMUSCULAR | Status: AC
  Filled 2016-07-04: qty 30

## 2016-07-04 MED ORDER — PROPOFOL 10 MG/ML IV BOLUS
INTRAVENOUS | Status: DC | PRN
  Administered 2016-07-04: 200 mg via INTRAVENOUS

## 2016-07-04 MED ORDER — LIDOCAINE HCL (CARDIAC) 20 MG/ML IV SOLN
INTRAVENOUS | Status: DC | PRN
  Administered 2016-07-04: 100 mg via INTRAVENOUS

## 2016-07-04 MED ORDER — ONDANSETRON HCL 4 MG/2ML IJ SOLN
INTRAMUSCULAR | Status: AC
  Filled 2016-07-04: qty 2

## 2016-07-04 MED ORDER — LACTATED RINGERS IV SOLN
INTRAVENOUS | Status: DC | PRN
  Administered 2016-07-04 (×2): via INTRAVENOUS

## 2016-07-04 MED ORDER — EPHEDRINE 5 MG/ML INJ
INTRAVENOUS | Status: AC
  Filled 2016-07-04: qty 10

## 2016-07-04 MED ORDER — ONDANSETRON HCL 4 MG/2ML IJ SOLN
INTRAMUSCULAR | Status: DC | PRN
  Administered 2016-07-04: 4 mg via INTRAVENOUS

## 2016-07-04 MED ORDER — DEXAMETHASONE SODIUM PHOSPHATE 10 MG/ML IJ SOLN
INTRAMUSCULAR | Status: AC
  Filled 2016-07-04: qty 1

## 2016-07-04 MED ORDER — ROCURONIUM BROMIDE 10 MG/ML (PF) SYRINGE
PREFILLED_SYRINGE | INTRAVENOUS | Status: AC
Start: 2016-07-04 — End: 2016-07-04
  Filled 2016-07-04: qty 10

## 2016-07-04 MED ORDER — SODIUM CHLORIDE 0.9 % IV SOLN
INTRAVENOUS | Status: DC | PRN
  Administered 2016-07-04: 6 mL

## 2016-07-04 MED ORDER — EPHEDRINE SULFATE 50 MG/ML IJ SOLN
INTRAMUSCULAR | Status: DC | PRN
  Administered 2016-07-04: 10 mg via INTRAVENOUS

## 2016-07-04 MED ORDER — OXYCODONE HCL 5 MG PO TABS
5.0000 mg | ORAL_TABLET | ORAL | Status: DC | PRN
  Administered 2016-07-04: 10 mg via ORAL

## 2016-07-04 SURGICAL SUPPLY — 48 items
APL SKNCLS STERI-STRIP NONHPOA (GAUZE/BANDAGES/DRESSINGS) ×1
APPLIER CLIP 5 13 M/L LIGAMAX5 (MISCELLANEOUS) ×3
APR CLP MED LRG 5 ANG JAW (MISCELLANEOUS) ×1
BAG SPEC RTRVL LRG 6X4 10 (ENDOMECHANICALS) ×1
BENZOIN TINCTURE PRP APPL 2/3 (GAUZE/BANDAGES/DRESSINGS) ×3 IMPLANT
CANISTER SUCTION 2500CC (MISCELLANEOUS) ×3 IMPLANT
CATH REDDICK CHOLANGI 4FR 50CM (CATHETERS) ×3 IMPLANT
CHLORAPREP W/TINT 26ML (MISCELLANEOUS) ×3 IMPLANT
CLIP APPLIE 5 13 M/L LIGAMAX5 (MISCELLANEOUS) ×1 IMPLANT
CLOSURE WOUND 1/2 X4 (GAUZE/BANDAGES/DRESSINGS) ×1
COVER MAYO STAND STRL (DRAPES) ×3 IMPLANT
COVER SURGICAL LIGHT HANDLE (MISCELLANEOUS) ×3 IMPLANT
DECANTER SPIKE VIAL GLASS SM (MISCELLANEOUS) ×3 IMPLANT
DRAPE C-ARM 42X72 X-RAY (DRAPES) ×3 IMPLANT
DRSG TEGADERM 2-3/8X2-3/4 SM (GAUZE/BANDAGES/DRESSINGS) ×12 IMPLANT
ELECT REM PT RETURN 9FT ADLT (ELECTROSURGICAL) ×3
ELECTRODE REM PT RTRN 9FT ADLT (ELECTROSURGICAL) ×1 IMPLANT
GAUZE SPONGE 2X2 8PLY STRL LF (GAUZE/BANDAGES/DRESSINGS) ×1 IMPLANT
GLOVE BIOGEL PI IND STRL 7.0 (GLOVE) IMPLANT
GLOVE BIOGEL PI IND STRL 8 (GLOVE) ×1 IMPLANT
GLOVE BIOGEL PI INDICATOR 7.0 (GLOVE) ×4
GLOVE BIOGEL PI INDICATOR 8 (GLOVE) ×2
GLOVE ECLIPSE 8.0 STRL XLNG CF (GLOVE) ×3 IMPLANT
GLOVE SURG SS PI 7.0 STRL IVOR (GLOVE) ×4 IMPLANT
GOWN STRL REUS W/ TWL LRG LVL3 (GOWN DISPOSABLE) ×3 IMPLANT
GOWN STRL REUS W/TWL LRG LVL3 (GOWN DISPOSABLE) ×12
HEMOSTAT SNOW SURGICEL 2X4 (HEMOSTASIS) ×2 IMPLANT
IV CATH 14GX2 1/4 (CATHETERS) ×3 IMPLANT
KIT BASIN OR (CUSTOM PROCEDURE TRAY) ×3 IMPLANT
KIT ROOM TURNOVER OR (KITS) ×3 IMPLANT
NS IRRIG 1000ML POUR BTL (IV SOLUTION) ×3 IMPLANT
PAD ARMBOARD 7.5X6 YLW CONV (MISCELLANEOUS) ×3 IMPLANT
POUCH SPECIMEN RETRIEVAL 10MM (ENDOMECHANICALS) ×3 IMPLANT
SCISSORS LAP 5X35 DISP (ENDOMECHANICALS) ×3 IMPLANT
SET IRRIG TUBING LAPAROSCOPIC (IRRIGATION / IRRIGATOR) ×3 IMPLANT
SLEEVE ENDOPATH XCEL 5M (ENDOMECHANICALS) ×6 IMPLANT
SPECIMEN JAR SMALL (MISCELLANEOUS) ×3 IMPLANT
SPONGE GAUZE 2X2 STER 10/PKG (GAUZE/BANDAGES/DRESSINGS) ×2
STRIP CLOSURE SKIN 1/2X4 (GAUZE/BANDAGES/DRESSINGS) ×2 IMPLANT
SUT MNCRL AB 4-0 PS2 18 (SUTURE) ×2 IMPLANT
SUT MON AB 4-0 PC3 18 (SUTURE) ×3 IMPLANT
TOWEL OR 17X24 6PK STRL BLUE (TOWEL DISPOSABLE) ×3 IMPLANT
TOWEL OR 17X26 10 PK STRL BLUE (TOWEL DISPOSABLE) ×3 IMPLANT
TRAY LAPAROSCOPIC MC (CUSTOM PROCEDURE TRAY) ×3 IMPLANT
TROCAR XCEL BLUNT TIP 100MML (ENDOMECHANICALS) ×3 IMPLANT
TROCAR XCEL NON-BLD 11X100MML (ENDOMECHANICALS) IMPLANT
TROCAR XCEL NON-BLD 5MMX100MML (ENDOMECHANICALS) ×3 IMPLANT
TUBING INSUFFLATION (TUBING) ×3 IMPLANT

## 2016-07-04 NOTE — Anesthesia Preprocedure Evaluation (Addendum)
Anesthesia Evaluation  Patient identified by MRN, date of birth, ID band Patient awake    Reviewed: Allergy & Precautions, H&P , NPO status , Patient's Chart, lab work & pertinent test results  History of Anesthesia Complications Negative for: history of anesthetic complications  Airway Mallampati: II  TM Distance: >3 FB Neck ROM: full    Dental  (+) Partial Lower, Partial Upper, Missing   Pulmonary former smoker,    Pulmonary exam normal breath sounds clear to auscultation       Cardiovascular hypertension, Normal cardiovascular exam Rhythm:regular Rate:Normal     Neuro/Psych negative neurological ROS     GI/Hepatic Neg liver ROS, GERD  ,  Endo/Other  negative endocrine ROS  Renal/GU negative Renal ROS     Musculoskeletal   Abdominal   Peds  Hematology negative hematology ROS (+)   Anesthesia Other Findings   Reproductive/Obstetrics negative OB ROS                            Anesthesia Physical Anesthesia Plan  ASA: II  Anesthesia Plan: General   Post-op Pain Management:    Induction: Intravenous  Airway Management Planned: Oral ETT  Additional Equipment:   Intra-op Plan:   Post-operative Plan: Extubation in OR  Informed Consent: I have reviewed the patients History and Physical, chart, labs and discussed the procedure including the risks, benefits and alternatives for the proposed anesthesia with the patient or authorized representative who has indicated his/her understanding and acceptance.   Dental Advisory Given  Plan Discussed with: Anesthesiologist, CRNA and Surgeon  Anesthesia Plan Comments:         Anesthesia Quick Evaluation

## 2016-07-04 NOTE — Interval H&P Note (Signed)
History and Physical Interval Note:  07/04/2016 7:28 AM  Gerald Odom  has presented today for surgery, with the diagnosis of gallstone pancreatitis  The various methods of treatment have been discussed with the patient and family. After consideration of risks, benefits and other options for treatment, the patient has consented to  Procedure(s): LAPAROSCOPIC CHOLECYSTECTOMY WITH INTRAOPERATIVE CHOLANGIOGRAM (N/A) as a surgical intervention .  The patient's history has been reviewed, patient examined, no change in status, stable for surgery.  I have reviewed the patient's chart and labs.  Questions were answered to the patient's satisfaction.     Gustavo Meditz Lenna Sciara

## 2016-07-04 NOTE — Anesthesia Postprocedure Evaluation (Signed)
Anesthesia Post Note  Patient: Gerald Odom  Procedure(s) Performed: Procedure(s) (LRB): LAPAROSCOPIC CHOLECYSTECTOMY WITH INTRAOPERATIVE CHOLANGIOGRAM (N/A)  Patient location during evaluation: PACU Anesthesia Type: General Level of consciousness: awake and alert Pain management: pain level controlled Vital Signs Assessment: post-procedure vital signs reviewed and stable Respiratory status: spontaneous breathing, nonlabored ventilation, respiratory function stable and patient connected to nasal cannula oxygen Cardiovascular status: blood pressure returned to baseline and stable Postop Assessment: no signs of nausea or vomiting Anesthetic complications: no    Last Vitals:  Vitals:   07/04/16 1030 07/04/16 1045  BP: (!) 147/72 139/80  Pulse: 71 77  Resp: 12 20  Temp:  36.5 C    Last Pain:  Vitals:   07/04/16 0930  PainSc: Singer Damoney Julia

## 2016-07-04 NOTE — H&P (Signed)
H & P  He was admitted to the hospital with acute gallstone pancreatitis from 05/14/16-05/20/16. He was found to have choledocholithiasis as well and underwent ERCP with sphincterotomy and stone extraction. He continued to have some smoldering pancreatitis which eventually abated. He also developed some pneumonia. Currently, he states he feels good on his restricted diet. He is walking one to 2 miles a day without any shortness of breath.  PMH Gallstone pancreatitis Pneumonia HTN Gout GERD  Past Surgical History  Knee Surgery Left.  Diagnostic Studies History  Colonoscopy 1-5 years ago  Allergies  No Known Drug Allergies 06/01/2016  Prior to Admission medications   Medication Sig Start Date End Date Taking? Authorizing Provider  allopurinol (ZYLOPRIM) 300 MG tablet Take 300 mg by mouth daily before breakfast.  05/03/16  Yes Historical Provider, MD  CHANTIX 1 MG tablet Take 1 mg by mouth 2 (two) times daily. 03/28/16  Yes Historical Provider, MD  colchicine 0.6 MG tablet Take 0.6 mg by mouth daily before breakfast.  03/25/16  Yes Historical Provider, MD  diphenhydramine-acetaminophen (TYLENOL PM) 25-500 MG TABS tablet Take 2 tablets by mouth at bedtime as needed (for sleep.).   Yes Historical Provider, MD  amoxicillin-clavulanate (AUGMENTIN) 875-125 MG tablet Take 1 tablet by mouth 2 (two) times daily. 05/20/16   Verlee Monte, MD  lisinopril (PRINIVIL,ZESTRIL) 5 MG tablet Take 1 tablet (5 mg total) by mouth daily. Patient not taking: Reported on 06/27/2016 05/20/16   Verlee Monte, MD    Social History Alcohol use Occasional alcohol use. Caffeine use Coffee. No drug use Tobacco use Former smoker.  Family History  Alcohol Abuse Brother. Hypertension Brother. Kidney Disease Brother.     Review of Systems  General Not Present- Appetite Loss, Chills, Fatigue, Fever, Night Sweats, Weight Gain and Weight Loss. Skin Not Present- Change in Wart/Mole, Dryness, Hives,  Jaundice, New Lesions, Non-Healing Wounds, Rash and Ulcer. HEENT Not Present- Earache, Hearing Loss, Hoarseness, Nose Bleed, Oral Ulcers, Ringing in the Ears, Seasonal Allergies, Sinus Pain, Sore Throat, Visual Disturbances, Wears glasses/contact lenses and Yellow Eyes. Respiratory Not Present- Bloody sputum, Chronic Cough, Difficulty Breathing, Snoring and Wheezing. Breast Not Present- Breast Mass, Breast Pain, Nipple Discharge and Skin Changes. Cardiovascular Not Present- Chest Pain, Difficulty Breathing Lying Down, Leg Cramps, Palpitations, Rapid Heart Rate, Shortness of Breath and Swelling of Extremities. Gastrointestinal Present- Gets full quickly at meals. Not Present- Abdominal Pain, Bloating, Bloody Stool, Change in Bowel Habits, Chronic diarrhea, Constipation, Difficulty Swallowing, Excessive gas, Hemorrhoids, Indigestion, Nausea, Rectal Pain and Vomiting. Male Genitourinary Not Present- Blood in Urine, Change in Urinary Stream, Frequency, Impotence, Nocturia, Painful Urination, Urgency and Urine Leakage. Musculoskeletal Not Present- Back Pain, Joint Pain, Joint Stiffness, Muscle Pain, Muscle Weakness and Swelling of Extremities. Neurological Not Present- Decreased Memory, Fainting, Headaches, Numbness, Seizures, Tingling, Tremor, Trouble walking and Weakness. Psychiatric Not Present- Anxiety, Bipolar, Change in Sleep Pattern, Depression, Fearful and Frequent crying. Endocrine Not Present- Cold Intolerance, Excessive Hunger, Hair Changes, Heat Intolerance, Hot flashes and New Diabetes. Hematology Not Present- Blood Thinners, Easy Bruising, Excessive bleeding, Gland problems, HIV and Persistent Infections.    Physical Exam   The physical exam findings are as follows: Note:General: WDWN in NAD. Pleasant and cooperative.  HEENT: [place]/AT, no external nasal or ear masses, mucous membranes are moist  EYES: no scleral icterus  ABDOMEN: Soft, nontender, nondistended, no masses, no  organomegaly, active bowel sounds, no scars, no hernias.  ANORECTAL: No fissures. Normal sphincter tone. No masses.  SKIN: No jaundice.  NEUROLOGIC: Alert and oriented, answers questions appropriately, normal gait and station.  PSYCHIATRIC: Normal mood, affect , and behavior.    Assessment & Plan   GALLSTONE PANCREATITIS (K85.10) Impression: He has recovered fully from this and his pneumonia.  Plan: Laparoscopic cholecystectomy with cholangiogram.   I have explained the procedure, risks, and aftercare of cholecystectomy previously.  Jackolyn Confer, MD

## 2016-07-04 NOTE — Anesthesia Procedure Notes (Signed)
Procedure Name: Intubation Date/Time: 07/04/2016 7:43 AM Performed by: Rebekah Chesterfield L Pre-anesthesia Checklist: Patient identified, Emergency Drugs available, Suction available and Patient being monitored Patient Re-evaluated:Patient Re-evaluated prior to inductionOxygen Delivery Method: Circle System Utilized Preoxygenation: Pre-oxygenation with 100% oxygen Intubation Type: IV induction Ventilation: Mask ventilation without difficulty Laryngoscope Size: Mac and 3 Grade View: Grade I Tube type: Oral Tube size: 7.5 mm Number of attempts: 1 Airway Equipment and Method: Stylet Placement Confirmation: ETT inserted through vocal cords under direct vision,  positive ETCO2 and breath sounds checked- equal and bilateral Secured at: 21 cm Tube secured with: Tape Dental Injury: Teeth and Oropharynx as per pre-operative assessment

## 2016-07-04 NOTE — Transfer of Care (Signed)
Immediate Anesthesia Transfer of Care Note  Patient: Gerald Odom  Procedure(s) Performed: Procedure(s): LAPAROSCOPIC CHOLECYSTECTOMY WITH INTRAOPERATIVE CHOLANGIOGRAM (N/A)  Patient Location: PACU  Anesthesia Type:General  Level of Consciousness: awake, alert , oriented and patient cooperative  Airway & Oxygen Therapy: Patient Spontanous Breathing and Patient connected to nasal cannula oxygen  Post-op Assessment: Report given to RN, Post -op Vital signs reviewed and stable and Patient moving all extremities  Post vital signs: Reviewed and stable  Last Vitals:  Vitals:   07/04/16 0623 07/04/16 0901  BP: (!) 156/93   Pulse: (!) 59   Resp: 20   Temp: 36.6 C (P) 36.5 C    Last Pain: There were no vitals filed for this visit.       Complications: No apparent anesthesia complications

## 2016-07-04 NOTE — Discharge Instructions (Signed)
[facility] ______[facility] LAPAROSCOPIC SURGERY: POST OP INSTRUCTIONS Always review your discharge instruction sheet given to you by the facility where your surgery was performed. IF YOU HAVE DISABILITY OR FAMILY LEAVE FORMS, YOU MUST BRING THEM TO THE OFFICE FOR PROCESSING.   DO NOT GIVE THEM TO YOUR DOCTOR.  1. A prescription for pain medication may be given to you upon discharge.  Take your pain medication as prescribed, if needed.  If narcotic pain medicine is not needed, then you may take acetaminophen (Tylenol) or ibuprofen (Advil) as needed. 2. Take your usually prescribed medications unless otherwise directed. 3. If you need a refill on your pain medication, please contact your pharmacy.  They will contact our office to request authorization. Prescriptions will not be filled after 5pm or on week-ends. 4. You should follow a liquid diet the first two days after arrival home, such as soup and crackers, etc. Then start a lowfat diet. Be sure to include lots of fluids daily. 5. Most patients will experience some swelling and bruising in the area of the incisions.  Ice packs will help.  Swelling and bruising can take several days to resolve.  6. It is common to experience some constipation if taking pain medication after surgery.  Increasing fluid intake and taking a stool softener (such as Colace) will usually help or prevent this problem from occurring.  A mild laxative (Milk of Magnesia or Miralax) should be taken according to package instructions if there are no bowel movements after 48 hours. 7. Unless discharge instructions indicate otherwise, you may remove your bandages 72 hours after surgery, and you may shower at that time.  You may have steri-strips (small skin tapes) in place directly over the incision.  These strips should be left on the skin.  If your surgeon used skin glue on the incision, you may shower in 24 hours.  The glue will flake off over the next 2-3 weeks.  Any  sutures or staples will be removed at the office during your follow-up visit. 8. ACTIVITIES:  You may resume regular (light) daily activities beginning the next day--such as daily self-care, walking, climbing stairs--gradually increasing activities as tolerated.  You may have sexual intercourse when it is comfortable.  Refrain from any heavy lifting or straining-nothing over 10 pounds for 2 weeks.  a. You may drive when you are no longer taking prescription pain medication, you can comfortably wear a seatbelt, and you can safely maneuver your car and apply brakes. b. RETURN TO WORK:  Desk work in one week.  Full duty in 2 weeks.__________________________________________________________ 9. You should see your doctor in the office for a follow-up appointment approximately 2-3 weeks after your surgery.  Make sure that you call for this appointment within a day or two after you arrive home to insure a convenient appointment time. 10. OTHER INSTRUCTIONS: __________________________________________________________________________________________________________________________ __________________________________________________________________________________________________________________________ WHEN TO CALL YOUR DOCTOR: 1. Fever over 101.0 2. Inability to urinate 3. Continued bleeding from incision. 4. Increased pain, redness, or drainage from the incision. 5. Increasing abdominal pain  The clinic staff is available to answer your questions during regular business hours.  Please dont hesitate to call and ask to speak to one of the nurses for clinical concerns.  If you have a medical emergency, go to the nearest emergency room or call 911.  A surgeon from [facility] is always on call at the hospital. [address] [place], [place], [place]  [address] ? [address] [address], [place], [place]  [id number] [phone] ? [phone] ? FAX [phone] Web site:  [email or web address]

## 2016-07-04 NOTE — Op Note (Signed)
OPERATIVE NOTE-LAPAROSCOPIC CHOLECYSTECTOMY  Preoperative diagnosis:  Gallstone pancreatitis   Postoperative diagnosis:  Same   Procedure: Laparoscopic cholecystectomy with cholangiogram.  Surgeon: Jackolyn Confer, M.D.  Asst.:  Fanny Skates, M.D.  Anesthesia: General  Indication:   This is a 65 year old male who had gallstone pancreatitis complicated by pneumonia.  He has completely recovered from both and presents for cholecystectomy.  Technique: He was brought to the operating room, placed supine on the operating table, and a general anesthetic was administered. The hair on the abdominal wall was clipped as was necessary. The abdominal wall was then sterilely prepped and draped.  A timeout was performed.    Local anesthetic (Marcaine) was infiltrated in the subumbilical region. A small subumbilical incision was made through the skin, subcutaneous tissue, fascia, and peritoneum entering the peritoneal cavity under direct vision. A pursestring suture of 0 Vicryl was placed around the edges of the fascia. A Hassan trocar was introduced into the peritoneal cavity and a pneumoperitoneum was created by insufflation of carbon dioxide gas. The laparoscope was introduced into the trocar and no underlying bleeding or organ injury was noted. He was then placed in the reverse Trendelenburg position with the right side tilted slightly up.  Three 5 mm trocars were then placed into the abdominal cavity under laparoscopic vision. One in the epigastric area, and 2 in the right upper quadrant area. The gallbladder was visualized and the fundus was grasped and retracted toward the right shoulder.  The infundibulum was mobilized with dissection close to the gallbladder and retracted laterally. The cystic duct was identified and a window was created around it. The cystic artery was also identified and a window was created around it. The critical view was achieved. A clip was placed at the neck of the  gallbladder. A small incision was made in the cystic duct. A cholangiocatheter was introduced through the anterior abdominal wall and placed in the cystic duct. A intraoperative cholangiogram was then performed.  Under real-time fluoroscopy, dilute contrast was injected into the cystic duct.  The common hepatic duct, the right and left hepatic ducts, and the common duct were all visualized. Contrast drained into the duodenum without obvious evidence of any obstructing ductal lesion. The final report is pending the Radiologist's interpretation.  The cholangiocatheter was removed, the cystic duct was clipped 3 times on the biliary side, and then the cystic duct was divided sharply. No bile leak was noted from the cystic duct stump.  The cystic artery was then clipped and divided. Following this the gallbladder was dissected free from the liver using electrocautery. The gallbladder was then placed in a retrieval bag and removed from the abdominal cavity through the subumbilical incision.  The gallbladder fossa was inspected, irrigated, and bleeding was controlled with electrocautery and Surgicel. Further inspection showed that hemostasis was adequate and there was no evidence of bile leak.  The irrigation fluid was evacuated as much as possible.  The subumbilical trocar was removed and the fascial defect was closed by tightening and tying down the pursestring suture under laparoscopic vision.  The remaining trocars were removed and the pneumoperitoneum was released. The skin incisions were closed with 4-0 Monocryl subcuticular stitches. Steri-Strips and sterile dressings were applied.  The procedure was well-tolerated without any apparent complications. He was taken to the recovery room in satisfactory condition.
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
## 2016-07-05 ENCOUNTER — Encounter (HOSPITAL_COMMUNITY): Payer: Self-pay

## 2016-07-05 ENCOUNTER — Emergency Department (HOSPITAL_COMMUNITY)
Admission: EM | Admit: 2016-07-05 | Discharge: 2016-07-05 | Disposition: A | Discharge status: Home / Self Care | Payer: PPO | Attending: Emergency Medicine | Admitting: Emergency Medicine

## 2016-07-05 ENCOUNTER — Emergency Department (HOSPITAL_COMMUNITY): Payer: PPO

## 2016-07-05 DIAGNOSIS — R1084 Generalized abdominal pain: Secondary | ICD-10-CM

## 2016-07-05 DIAGNOSIS — Z79899 Other long term (current) drug therapy: Secondary | ICD-10-CM | POA: Insufficient documentation

## 2016-07-05 DIAGNOSIS — Z87891 Personal history of nicotine dependence: Secondary | ICD-10-CM | POA: Insufficient documentation

## 2016-07-05 DIAGNOSIS — I10 Essential (primary) hypertension: Secondary | ICD-10-CM | POA: Diagnosis not present

## 2016-07-05 DIAGNOSIS — R1011 Right upper quadrant pain: Secondary | ICD-10-CM | POA: Diagnosis not present

## 2016-07-05 LAB — CBC WITH DIFFERENTIAL/PLATELET
Basophils Absolute: 0 10*3/uL (ref 0.0–0.1)
Basophils Relative: 0 %
EOS ABS: 0 10*3/uL (ref 0.0–0.7)
EOS PCT: 0 %
HCT: 42 % (ref 39.0–52.0)
HEMOGLOBIN: 14.2 g/dL (ref 13.0–17.0)
Lymphocytes Relative: 15 %
Lymphs Abs: 2.7 10*3/uL (ref 0.7–4.0)
MCH: 31.9 pg (ref 26.0–34.0)
MCHC: 33.8 g/dL (ref 30.0–36.0)
MCV: 94.4 fL (ref 78.0–100.0)
Monocytes Absolute: 1.6 10*3/uL — ABNORMAL HIGH (ref 0.1–1.0)
Monocytes Relative: 9 %
NEUTROS PCT: 77 %
Neutro Abs: 14.1 10*3/uL — ABNORMAL HIGH (ref 1.7–7.7)
PLATELETS: 243 10*3/uL (ref 150–400)
RBC: 4.45 MIL/uL (ref 4.22–5.81)
RDW: 14.1 % (ref 11.5–15.5)
WBC: 18.4 10*3/uL — AB (ref 4.0–10.5)

## 2016-07-05 LAB — COMPREHENSIVE METABOLIC PANEL
ALBUMIN: 3.7 g/dL (ref 3.5–5.0)
ALK PHOS: 73 U/L (ref 38–126)
ALK PHOS: 69 U/L (ref 38–126)
ALT: 50 U/L (ref 17–63)
ALT: 49 U/L (ref 17–63)
ANION GAP: 8 (ref 5–15)
AST: 40 U/L (ref 15–41)
AST: 39 U/L (ref 15–41)
Albumin: 3.9 g/dL (ref 3.5–5.0)
Anion gap: 9 (ref 5–15)
BUN: 7 mg/dL (ref 6–20)
BUN: 7 mg/dL (ref 6–20)
CALCIUM: 9.5 mg/dL (ref 8.9–10.3)
CALCIUM: 9.3 mg/dL (ref 8.9–10.3)
CHLORIDE: 107 mmol/L (ref 101–111)
CO2: 28 mmol/L (ref 22–32)
CO2: 24 mmol/L (ref 22–32)
CREATININE: 0.92 mg/dL (ref 0.61–1.24)
CREATININE: 0.87 mg/dL (ref 0.61–1.24)
Chloride: 105 mmol/L (ref 101–111)
GFR calc Af Amer: >60 mL/min (ref >60)
GFR calc Af Amer: >60 mL/min (ref >60)
GFR calc non Af Amer: >60 mL/min (ref >60)
GFR calc non Af Amer: >60 mL/min (ref >60)
GLUCOSE: 123 mg/dL — AB (ref 65–99)
Glucose, Bld: 113 mg/dL — ABNORMAL HIGH (ref 65–99)
Potassium: 4.1 mmol/L (ref 3.5–5.1)
Potassium: 3.8 mmol/L (ref 3.5–5.1)
SODIUM: 140 mmol/L (ref 135–145)
Sodium: 141 mmol/L (ref 135–145)
TOTAL PROTEIN: 6.6 g/dL (ref 6.5–8.1)
Total Bilirubin: 0.8 mg/dL (ref 0.3–1.2)
Total Bilirubin: 0.8 mg/dL (ref 0.3–1.2)
Total Protein: 6.8 g/dL (ref 6.5–8.1)

## 2016-07-05 LAB — CBC
HEMATOCRIT: 44.3 % (ref 39.0–52.0)
Hemoglobin: 14.6 g/dL (ref 13.0–17.0)
MCH: 31.3 pg (ref 26.0–34.0)
MCHC: 33 g/dL (ref 30.0–36.0)
MCV: 94.9 fL (ref 78.0–100.0)
PLATELETS: 262 10*3/uL (ref 150–400)
RBC: 4.67 MIL/uL (ref 4.22–5.81)
RDW: 14.3 % (ref 11.5–15.5)
WBC: 17.2 10*3/uL — AB (ref 4.0–10.5)

## 2016-07-05 LAB — LIPASE, BLOOD
LIPASE: 23 U/L (ref 11–51)
Lipase: 21 U/L (ref 11–51)

## 2016-07-05 MED ORDER — IOPAMIDOL (ISOVUE-300) INJECTION 61%
INTRAVENOUS | Status: AC
  Administered 2016-07-05: 100 mL
  Filled 2016-07-05: qty 100

## 2016-07-05 MED ORDER — OXYCODONE-ACETAMINOPHEN 5-325 MG PO TABS
1.0000 | ORAL_TABLET | Freq: Once | ORAL | Status: AC
  Administered 2016-07-05: 1 via ORAL
  Filled 2016-07-05: qty 1

## 2016-07-05 MED ORDER — SODIUM CHLORIDE 0.9 % IV SOLN
INTRAVENOUS | Status: DC
  Administered 2016-07-05: 17:00:00 via INTRAVENOUS

## 2016-07-05 MED ORDER — HYDROMORPHONE HCL 1 MG/ML IJ SOLN
1.0000 mg | Freq: Once | INTRAMUSCULAR | Status: AC
  Administered 2016-07-05: 1 mg via INTRAVENOUS
  Filled 2016-07-05: qty 1

## 2016-07-05 MED ORDER — ONDANSETRON 4 MG PO TBDP: 4.0000 mg | ORAL_TABLET | Freq: Three times a day (TID) | ORAL | 0 refills | Status: DC | PRN

## 2016-07-05 NOTE — Progress Notes (Signed)
  Subjective: States he had a biscuit on the way home from his surgery yesterday and then developed persistent nausea an vomiting.  Has not been able to keep anything down including his oral analgesic or oral Zofran.  Has right sided soreness.  No fever.  Objective: Vital signs in last 24 hours: Temp:  [97.9 F (36.6 C)] 97.9 F (36.6 C) (09/19 1355) Pulse Rate:  [74-82] 74 (09/19 1355) Resp:  [18] 18 (09/19 1355) BP: (105-135)/(70-84) 105/70 (09/19 1355) SpO2:  [93 %] 93 % (09/19 1355) Weight:  [73 kg (161 lb)] 73 kg (161 lb) (09/19 1125)    Intake/Output from previous day: No intake/output data recorded. Intake/Output this shift: No intake/output data recorded.  PE: General- He looks comfortable and is smiling.  Afebrile. CV-RRR Lungs-clear Abdomen-soft, dressings dry, mild tenderness around incisions  Lab Results:   Recent Labs  07/05/16 1527  WBC 18.4*  HGB 14.2  HCT 42.0  PLT 243   BMET  Recent Labs  07/05/16 1527  NA 140  K 3.8  CL 107  CO2 24  GLUCOSE 123*  BUN 7  CREATININE 0.87  CALCIUM 9.3   PT/INR No results for input(s): LABPROT, INR in the last 72 hours. Comprehensive Metabolic Panel:    Component Value Date/Time   NA 140 07/05/2016 1527   NA 140 06/29/2016 0956   K 3.8 07/05/2016 1527   K 3.9 06/29/2016 0956   CL 107 07/05/2016 1527   CL 108 06/29/2016 0956   CO2 24 07/05/2016 1527   CO2 23 06/29/2016 0956   BUN 7 07/05/2016 1527   BUN 9 06/29/2016 0956   CREATININE 0.87 07/05/2016 1527   CREATININE 0.83 06/29/2016 0956   GLUCOSE 123 (H) 07/05/2016 1527   GLUCOSE 115 (H) 06/29/2016 0956   CALCIUM 9.3 07/05/2016 1527   CALCIUM 9.7 06/29/2016 0956   AST 39 07/05/2016 1527   AST 23 06/29/2016 0956   ALT 49 07/05/2016 1527   ALT 32 06/29/2016 0956   ALKPHOS 69 07/05/2016 1527   ALKPHOS 89 06/29/2016 0956   BILITOT 0.8 07/05/2016 1527   BILITOT 1.0 06/29/2016 0956   PROT 6.8 07/05/2016 1527   PROT 7.0 06/29/2016 0956   ALBUMIN 3.7 07/05/2016 1527   ALBUMIN 4.0 06/29/2016 0956     Studies/Results: Dg Cholangiogram Operative  Result Date: 07/04/2016 CLINICAL DATA:  Gallstones EXAM: INTRAOPERATIVE CHOLANGIOGRAM TECHNIQUE: Cholangiographic images from the C-arm fluoroscopic device were submitted for interpretation post-operatively. Please see the procedural report for the amount of contrast and the fluoroscopy time utilized. COMPARISON:  05/16/2016 FINDINGS: Contrast fills the biliary tree and duodenum without filling defects in the common bile duct. The common bile duct is somewhat dilated distally. The ampulla is somewhat narrowed. The borders are smooth in this may be related to edema or scarring. IMPRESSION: Patent biliary tree. Previously seen filling defects in the common bile duct are no longer present. Electronically Signed   By: Marybelle Killings M.D.   On: 07/04/2016 08:46    Anti-infectives: Anti-infectives    None      Assessment Severe postop n/v post lap chole-LFTs and lipase are normal.  CBC pending. RUQ pain likely exacerbated by vomiting-CT ordered to r/o postop complication   LOS: 0 days   Plan: IVF hydration in ED.  Await CT results.   Brystol Wasilewski J 07/05/2016

## 2016-07-05 NOTE — ED Provider Notes (Signed)
[place] DEPT Provider Note   CSN: [id number] Arrival date & time: 07/05/16  1122     History   Chief Complaint Chief Complaint  Patient presents with  . Abdominal Pain    HPI Gerald Odom is a 65 y.o. male.  HPI  Patient is a 65 year old male past medical history of hypertension pancreatitis who comes in today following a cholecystectomy yesterday with right upper quadrant and diffuse abdominal pain with 2 episodes emesis last night.  Patient states that he returned home surgery yesterday without issue but last night started to have severe nausea with emesis.  Patient then had an onset of diffuse abdominal pain most focused in the right upper quadrant.  Patient states it is an aching pain proximally 7 out of 10 pain aggravated by any kind of movement or palpation.  Patient denies fevers chills dysuria chest pain or shortness of breath. Past Medical History:  Diagnosis Date  . Gout   . Hypertension   . Pancreatitis 05/2016   hosp. [facility]  . Pneumonia 05/2016    Patient Active Problem List   Diagnosis Date Noted  . Choledocholithiasis 05/16/2016  . Gout 05/14/2016  . GERD (gastroesophageal reflux disease) 05/14/2016  . Gall stone pancreatitis 05/14/2016  . Acute cholangitis 05/14/2016  . Gallstone pancreatitis 05/14/2016    Past Surgical History:  Procedure Laterality Date  . CHOLECYSTECTOMY    . CHOLECYSTECTOMY N/A 07/04/2016   Procedure: LAPAROSCOPIC CHOLECYSTECTOMY WITH INTRAOPERATIVE CHOLANGIOGRAM;  Surgeon: Jackolyn Confer, MD;  Location: [facility];  Service: General;  Laterality: N/A;  . ERCP N/A 05/16/2016   Procedure: ENDOSCOPIC RETROGRADE CHOLANGIOPANCREATOGRAPHY (ERCP);  Surgeon: Doran Stabler, MD;  Location: [facility];  Service: Endoscopy;  Laterality: N/A;  . KNEE SURGERY Left 1997       Home Medications    Prior to Admission medications   Medication Sig Start Date End Date Taking? Authorizing Provider  allopurinol (ZYLOPRIM) 300 MG tablet  Take 300 mg by mouth daily before breakfast.  05/03/16  Yes Historical Provider, MD  CHANTIX 1 MG tablet Take 1 mg by mouth 2 (two) times daily. 03/28/16  Yes Historical Provider, MD  colchicine 0.6 MG tablet Take 0.6 mg by mouth daily before breakfast.  03/25/16  Yes Historical Provider, MD  diphenhydramine-acetaminophen (TYLENOL PM) 25-500 MG TABS tablet Take 2 tablets by mouth at bedtime as needed (for sleep.).   Yes Historical Provider, MD  lisinopril (PRINIVIL,ZESTRIL) 5 MG tablet Take 1 tablet (5 mg total) by mouth daily. 05/20/16  Yes Verlee Monte, MD  oxyCODONE (OXY IR/ROXICODONE) 5 MG immediate release tablet Take 1-2 tablets (5-10 mg total) by mouth every 4 (four) hours as needed for moderate pain, severe pain or breakthrough pain. 07/04/16  Yes Jackolyn Confer, MD  polyethylene glycol (MIRALAX / GLYCOLAX) packet Take 17 g by mouth daily as needed for moderate constipation.   Yes Historical Provider, MD  ondansetron (ZOFRAN ODT) 4 MG disintegrating tablet Take 1 tablet (4 mg total) by mouth every 8 (eight) hours as needed for nausea or vomiting. 07/05/16   Chapman Moss, MD    Family History No family history on file.  Social History Social History  Substance Use Topics  . Smoking status: Former Smoker    Quit date: 01/28/2016  . Smokeless tobacco: Former Systems developer  . Alcohol use No     Allergies   Review of patient's allergies indicates no known allergies.   Review of Systems Review of Systems  Constitutional: Negative for chills and fever.  Respiratory: Negative for chest tightness and shortness of breath.   Cardiovascular: Negative for chest pain.  Gastrointestinal: Positive for abdominal pain, nausea and vomiting.  All other systems reviewed and are negative.    Physical Exam Updated Vital Signs BP 142/91   Pulse 80   Temp 97.9 F (36.6 C) (Oral)   Resp 18   Ht 5\' 7"  (1.702 m)   Wt 73 kg   SpO2 94%   BMI 25.22 kg/m   Physical Exam  Constitutional: He appears  well-developed and well-nourished.  HENT:  Head: Normocephalic and atraumatic.  Eyes: Conjunctivae are normal.  Neck: Neck supple.  Cardiovascular: Normal rate and regular rhythm.   No murmur heard. Pulmonary/Chest: Effort normal and breath sounds normal. No respiratory distress.  Abdominal: Soft. Bowel sounds are normal. He exhibits distension. There is tenderness. There is guarding.  Musculoskeletal: He exhibits no edema.  Neurological: He is alert.  Skin: Skin is warm and dry.  Psychiatric: He has a normal mood and affect.  Nursing note and vitals reviewed.    ED Treatments / Results  Labs (all labs ordered are listed, but only abnormal results are displayed) Labs Reviewed  CBC WITH DIFFERENTIAL/PLATELET - Abnormal; Notable for the following:       Result Value   WBC 18.4 (*)    Neutro Abs 14.1 (*)    Monocytes Absolute 1.6 (*)    All other components within normal limits  COMPREHENSIVE METABOLIC PANEL - Abnormal; Notable for the following:    Glucose, Bld 123 (*)    All other components within normal limits  CBC - Abnormal; Notable for the following:    WBC 17.2 (*)    All other components within normal limits  COMPREHENSIVE METABOLIC PANEL - Abnormal; Notable for the following:    Glucose, Bld 113 (*)    All other components within normal limits  LIPASE, BLOOD  LIPASE, BLOOD    EKG  EKG Interpretation None       Radiology Dg Cholangiogram Operative  Result Date: 07/04/2016 CLINICAL DATA:  Gallstones EXAM: INTRAOPERATIVE CHOLANGIOGRAM TECHNIQUE: Cholangiographic images from the C-arm fluoroscopic device were submitted for interpretation post-operatively. Please see the procedural report for the amount of contrast and the fluoroscopy time utilized. COMPARISON:  05/16/2016 FINDINGS: Contrast fills the biliary tree and duodenum without filling defects in the common bile duct. The common bile duct is somewhat dilated distally. The ampulla is somewhat narrowed. The  borders are smooth in this may be related to edema or scarring. IMPRESSION: Patent biliary tree. Previously seen filling defects in the common bile duct are no longer present. Electronically Signed   By: Marybelle Killings M.D.   On: 07/04/2016 08:46   Ct Abdomen Pelvis W Contrast  Result Date: 07/05/2016 CLINICAL DATA:  Laparoscopic cholecystectomy yesterday. Right upper quadrant pain and vomiting postoperatively with persistent pain. EXAM: CT ABDOMEN AND PELVIS WITH CONTRAST TECHNIQUE: Multidetector CT imaging of the abdomen and pelvis was performed using the standard protocol following bolus administration of intravenous contrast. CONTRAST:  162mL ISOVUE-300 IOPAMIDOL (ISOVUE-300) INJECTION 61% COMPARISON:  Operative images yesterday.  MRCP 05/14/2016 FINDINGS: Lower chest: There is atelectasis at both lung bases right more than left. Coronary artery calcification incidentally noted. Hepatobiliary: Liver parenchyma shows mild fatty change but no focal lesion. Surgical clips present in the gallbladder be head. Small amount of fluid in the gallbladder head. These are expected findings. There is no evidence of hematoma or other an expected fluid collection. No hyperdense stones in the  common duct. Pancreas: Chronic calcification in the pancreatic head. There is a 3.8 cm low-density fluid collection just ventral to the pancreas, probably relating to the previous pancreatitis. This is probably not clinically relevant. No acute pancreatic pathology. Spleen: Normal Adrenals/Urinary Tract: Adrenal glands are normal. Kidneys are normal. Stomach/Bowel: No intestinal pathology. Vascular/Lymphatic: Aortic atherosclerosis. No aneurysm. The IVC is normal. No retroperitoneal mass or lymphadenopathy. Reproductive: Enlarged prostate gland. Other: No free fluid or free air. No abnormality seen relating to laparoscopic approach. Musculoskeletal: Normal IMPRESSION: Expected appearance following laparoscopic cholecystectomy. Fluid in  the gallbladder fossa, normal for the first postoperative day. No detectable complication. 3.8 cm fluid collection ventral the pancreas, residua from the recent episode of pancreatitis and probably not significant. Aortic atherosclerosis. Electronically Signed   By: Nelson Chimes M.D.   On: 07/05/2016 18:37    Procedures Procedures (including critical care time)  Medications Ordered in ED Medications  HYDROmorphone (DILAUDID) injection 1 mg (1 mg Intravenous Given 07/05/16 1749)  iopamidol (ISOVUE-300) 61 % injection (100 mLs  Contrast Given 07/05/16 1803)  oxyCODONE-acetaminophen (PERCOCET/ROXICET) 5-325 MG per tablet 1 tablet (1 tablet Oral Given 07/05/16 1921)     Initial Impression / Assessment and Plan / ED Course  I have reviewed the triage vital signs and the nursing notes.  Pertinent labs & imaging results that were available during my care of the patient were reviewed by me and considered in my medical decision making (see chart for details).  Clinical Course   Patient is a 65 year old male past medical history of hypertension pancreatitis who comes in today following a cholecystectomy yesterday with right upper quadrant and diffuse abdominal pain with 2 episodes emesis last night.  Patient states that he returned home surgery yesterday without issue but last night started to have severe nausea with emesis.  Patient then had an onset of diffuse abdominal pain most focused in the right upper quadrant.  Patient states it is an aching pain proximally 7 out of 10 pain aggravated by any kind of movement or palpation.  Patient denies fevers chills dysuria chest pain or shortness of breath.  Physical exam patient with diffusely distended abdomen.  Surgical site clean and intact.  Patient generalized tenderness palpation unable to assess for rebound this patient's pain is too significant with deep palpation.  Bowel sounds normal.  Remainder physical exam within normal limits.  We will collect  basic labs as well as CT abdomen to assess for possible post surgical complication.  Patient's laboratory workup showed no acute abnormalities that were concerning.  CT scan showed normal postsurgical changes without evidence of abnormality.  Believe patient's nausea and vomiting to be secondary to either anesthesia or pain medication.  Patient previously given prescription for oral Zofran, will change to ODT Zofran.  Pt given appropriate f/u and return precautions. Pt voiced understanding and is agreeable to discharge at this time.    Final Clinical Impressions(s) / ED Diagnoses   Final diagnoses:  Generalized abdominal pain    New Prescriptions Discharge Medication List as of 07/05/2016  6:59 PM    START taking these medications   Details  ondansetron (ZOFRAN ODT) 4 MG disintegrating tablet Take 1 tablet (4 mg total) by mouth every 8 (eight) hours as needed for nausea or vomiting., Starting Tue 07/05/2016, Print         Chapman Moss, MD 07/05/16 [place] Liu, MD 07/06/16 0009

## 2016-07-05 NOTE — ED Notes (Signed)
Pt updated on wait, pt requested something for pain RN aware. No other complaints

## 2016-07-05 NOTE — ED Notes (Signed)
Pt O2 sats 87% on RA, patient reports pain with breathing, placed on 2L nasal cannula and MD notified. Given verbal order for 1mg  dilaudid.

## 2016-07-05 NOTE — ED Triage Notes (Signed)
Per Pt, Pt is coming from home with complaints of right sided abdominal pain that started yesterday after he had an outpatient Gallbladder removal. Pt reports being unable to fully lie down or sit up without extreme pain. Reports two episodes of violent vomiting. Called surgeon and was told to come in. Pt has tried Pain medication with no relief.

## 2016-08-12 DIAGNOSIS — H43392 Other vitreous opacities, left eye: Secondary | ICD-10-CM | POA: Diagnosis not present

## 2016-08-16 DIAGNOSIS — H4312 Vitreous hemorrhage, left eye: Secondary | ICD-10-CM | POA: Diagnosis not present

## 2016-08-16 DIAGNOSIS — H43812 Vitreous degeneration, left eye: Secondary | ICD-10-CM | POA: Diagnosis not present

## 2016-08-26 DIAGNOSIS — H4312 Vitreous hemorrhage, left eye: Secondary | ICD-10-CM | POA: Diagnosis not present

## 2016-08-26 DIAGNOSIS — H35373 Puckering of macula, bilateral: Secondary | ICD-10-CM | POA: Diagnosis not present

## 2016-08-26 DIAGNOSIS — H43812 Vitreous degeneration, left eye: Secondary | ICD-10-CM | POA: Diagnosis not present

## 2016-08-31 DIAGNOSIS — R768 Other specified abnormal immunological findings in serum: Secondary | ICD-10-CM | POA: Diagnosis not present

## 2016-08-31 DIAGNOSIS — Z79899 Other long term (current) drug therapy: Secondary | ICD-10-CM | POA: Diagnosis not present

## 2016-08-31 DIAGNOSIS — M1009 Idiopathic gout, multiple sites: Secondary | ICD-10-CM | POA: Diagnosis not present

## 2016-09-02 DIAGNOSIS — M10011 Idiopathic gout, right shoulder: Secondary | ICD-10-CM | POA: Diagnosis not present

## 2016-09-17 DIAGNOSIS — J0100 Acute maxillary sinusitis, unspecified: Secondary | ICD-10-CM | POA: Diagnosis not present

## 2016-09-21 DIAGNOSIS — Z1211 Encounter for screening for malignant neoplasm of colon: Secondary | ICD-10-CM | POA: Diagnosis not present

## 2016-09-21 DIAGNOSIS — R3911 Hesitancy of micturition: Secondary | ICD-10-CM | POA: Diagnosis not present

## 2016-09-21 DIAGNOSIS — M10041 Idiopathic gout, right hand: Secondary | ICD-10-CM | POA: Diagnosis not present

## 2016-09-21 DIAGNOSIS — J208 Acute bronchitis due to other specified organisms: Secondary | ICD-10-CM | POA: Diagnosis not present

## 2016-09-21 DIAGNOSIS — Z23 Encounter for immunization: Secondary | ICD-10-CM | POA: Diagnosis not present

## 2016-09-28 DIAGNOSIS — M0579 Rheumatoid arthritis with rheumatoid factor of multiple sites without organ or systems involvement: Secondary | ICD-10-CM | POA: Diagnosis not present

## 2016-09-28 DIAGNOSIS — Z6824 Body mass index (BMI) 24.0-24.9, adult: Secondary | ICD-10-CM | POA: Diagnosis not present

## 2016-09-28 DIAGNOSIS — M1009 Idiopathic gout, multiple sites: Secondary | ICD-10-CM | POA: Diagnosis not present

## 2016-09-28 DIAGNOSIS — Z79899 Other long term (current) drug therapy: Secondary | ICD-10-CM | POA: Diagnosis not present

## 2016-10-07 DIAGNOSIS — H43812 Vitreous degeneration, left eye: Secondary | ICD-10-CM | POA: Diagnosis not present

## 2016-10-07 DIAGNOSIS — H35373 Puckering of macula, bilateral: Secondary | ICD-10-CM | POA: Diagnosis not present

## 2016-10-07 DIAGNOSIS — H4312 Vitreous hemorrhage, left eye: Secondary | ICD-10-CM | POA: Diagnosis not present

## 2016-10-12 DIAGNOSIS — H4311 Vitreous hemorrhage, right eye: Secondary | ICD-10-CM | POA: Diagnosis not present

## 2016-10-12 DIAGNOSIS — H43811 Vitreous degeneration, right eye: Secondary | ICD-10-CM | POA: Diagnosis not present

## 2016-10-12 DIAGNOSIS — H3561 Retinal hemorrhage, right eye: Secondary | ICD-10-CM | POA: Diagnosis not present

## 2016-11-04 DIAGNOSIS — H43813 Vitreous degeneration, bilateral: Secondary | ICD-10-CM | POA: Diagnosis not present

## 2016-11-04 DIAGNOSIS — H4311 Vitreous hemorrhage, right eye: Secondary | ICD-10-CM | POA: Diagnosis not present

## 2016-11-04 DIAGNOSIS — H3561 Retinal hemorrhage, right eye: Secondary | ICD-10-CM | POA: Diagnosis not present

## 2016-11-04 DIAGNOSIS — H43391 Other vitreous opacities, right eye: Secondary | ICD-10-CM | POA: Diagnosis not present

## 2016-11-04 DIAGNOSIS — H33311 Horseshoe tear of retina without detachment, right eye: Secondary | ICD-10-CM | POA: Diagnosis not present

## 2016-11-04 DIAGNOSIS — H35373 Puckering of macula, bilateral: Secondary | ICD-10-CM | POA: Diagnosis not present

## 2016-11-04 DIAGNOSIS — H43811 Vitreous degeneration, right eye: Secondary | ICD-10-CM | POA: Diagnosis not present

## 2016-11-11 DIAGNOSIS — Z6825 Body mass index (BMI) 25.0-25.9, adult: Secondary | ICD-10-CM | POA: Diagnosis not present

## 2016-11-11 DIAGNOSIS — M1009 Idiopathic gout, multiple sites: Secondary | ICD-10-CM | POA: Diagnosis not present

## 2016-11-11 DIAGNOSIS — M0579 Rheumatoid arthritis with rheumatoid factor of multiple sites without organ or systems involvement: Secondary | ICD-10-CM | POA: Diagnosis not present

## 2016-11-11 DIAGNOSIS — E663 Overweight: Secondary | ICD-10-CM | POA: Diagnosis not present

## 2016-11-11 DIAGNOSIS — Z79899 Other long term (current) drug therapy: Secondary | ICD-10-CM | POA: Diagnosis not present

## 2016-11-14 DIAGNOSIS — H33311 Horseshoe tear of retina without detachment, right eye: Secondary | ICD-10-CM | POA: Diagnosis not present

## 2016-12-28 DIAGNOSIS — K5730 Diverticulosis of large intestine without perforation or abscess without bleeding: Secondary | ICD-10-CM | POA: Diagnosis not present

## 2016-12-28 DIAGNOSIS — Z86010 Personal history of colonic polyps: Secondary | ICD-10-CM | POA: Diagnosis not present

## 2016-12-28 LAB — HM COLONOSCOPY

## 2017-01-18 DIAGNOSIS — Z23 Encounter for immunization: Secondary | ICD-10-CM | POA: Diagnosis not present

## 2017-01-18 DIAGNOSIS — F17290 Nicotine dependence, other tobacco product, uncomplicated: Secondary | ICD-10-CM | POA: Diagnosis not present

## 2017-01-18 DIAGNOSIS — Z0001 Encounter for general adult medical examination with abnormal findings: Secondary | ICD-10-CM | POA: Diagnosis not present

## 2017-01-18 DIAGNOSIS — Z125 Encounter for screening for malignant neoplasm of prostate: Secondary | ICD-10-CM | POA: Diagnosis not present

## 2017-01-18 DIAGNOSIS — Z6825 Body mass index (BMI) 25.0-25.9, adult: Secondary | ICD-10-CM | POA: Diagnosis not present

## 2017-01-18 DIAGNOSIS — E782 Mixed hyperlipidemia: Secondary | ICD-10-CM | POA: Diagnosis not present

## 2017-01-18 DIAGNOSIS — M05731 Rheumatoid arthritis with rheumatoid factor of right wrist without organ or systems involvement: Secondary | ICD-10-CM | POA: Diagnosis not present

## 2017-01-19 DIAGNOSIS — M05079 Felty's syndrome, unspecified ankle and foot: Secondary | ICD-10-CM | POA: Diagnosis not present

## 2017-01-19 DIAGNOSIS — Z13220 Encounter for screening for lipoid disorders: Secondary | ICD-10-CM | POA: Diagnosis not present

## 2017-01-19 DIAGNOSIS — Z125 Encounter for screening for malignant neoplasm of prostate: Secondary | ICD-10-CM | POA: Diagnosis not present

## 2017-01-24 DIAGNOSIS — J189 Pneumonia, unspecified organism: Secondary | ICD-10-CM | POA: Diagnosis not present

## 2017-01-24 DIAGNOSIS — R05 Cough: Secondary | ICD-10-CM | POA: Diagnosis not present

## 2017-01-24 DIAGNOSIS — J0100 Acute maxillary sinusitis, unspecified: Secondary | ICD-10-CM | POA: Diagnosis not present

## 2017-01-24 DIAGNOSIS — R0602 Shortness of breath: Secondary | ICD-10-CM | POA: Diagnosis not present

## 2017-02-07 DIAGNOSIS — N3090 Cystitis, unspecified without hematuria: Secondary | ICD-10-CM | POA: Diagnosis not present

## 2017-02-22 DIAGNOSIS — J309 Allergic rhinitis, unspecified: Secondary | ICD-10-CM | POA: Diagnosis not present

## 2017-02-28 DIAGNOSIS — M1009 Idiopathic gout, multiple sites: Secondary | ICD-10-CM | POA: Diagnosis not present

## 2017-02-28 DIAGNOSIS — M0579 Rheumatoid arthritis with rheumatoid factor of multiple sites without organ or systems involvement: Secondary | ICD-10-CM | POA: Diagnosis not present

## 2017-02-28 DIAGNOSIS — Z79899 Other long term (current) drug therapy: Secondary | ICD-10-CM | POA: Diagnosis not present

## 2017-02-28 DIAGNOSIS — Z6824 Body mass index (BMI) 24.0-24.9, adult: Secondary | ICD-10-CM | POA: Diagnosis not present

## 2017-02-28 DIAGNOSIS — M2550 Pain in unspecified joint: Secondary | ICD-10-CM | POA: Diagnosis not present

## 2017-03-08 DIAGNOSIS — M0579 Rheumatoid arthritis with rheumatoid factor of multiple sites without organ or systems involvement: Secondary | ICD-10-CM | POA: Diagnosis not present

## 2017-04-13 DIAGNOSIS — M79672 Pain in left foot: Secondary | ICD-10-CM | POA: Diagnosis not present

## 2017-04-13 DIAGNOSIS — M79641 Pain in right hand: Secondary | ICD-10-CM | POA: Diagnosis not present

## 2017-04-13 DIAGNOSIS — M79642 Pain in left hand: Secondary | ICD-10-CM | POA: Diagnosis not present

## 2017-04-13 DIAGNOSIS — M069 Rheumatoid arthritis, unspecified: Secondary | ICD-10-CM | POA: Diagnosis not present

## 2017-04-13 DIAGNOSIS — M10049 Idiopathic gout, unspecified hand: Secondary | ICD-10-CM | POA: Diagnosis not present

## 2017-04-13 DIAGNOSIS — M79671 Pain in right foot: Secondary | ICD-10-CM | POA: Diagnosis not present

## 2017-04-13 DIAGNOSIS — Z79899 Other long term (current) drug therapy: Secondary | ICD-10-CM | POA: Diagnosis not present

## 2017-04-13 DIAGNOSIS — M7731 Calcaneal spur, right foot: Secondary | ICD-10-CM | POA: Diagnosis not present

## 2017-04-13 DIAGNOSIS — M19071 Primary osteoarthritis, right ankle and foot: Secondary | ICD-10-CM | POA: Diagnosis not present

## 2017-04-24 DIAGNOSIS — A6920 Lyme disease, unspecified: Secondary | ICD-10-CM | POA: Diagnosis not present

## 2017-05-02 DIAGNOSIS — J302 Other seasonal allergic rhinitis: Secondary | ICD-10-CM | POA: Diagnosis not present

## 2017-05-10 DIAGNOSIS — Z79899 Other long term (current) drug therapy: Secondary | ICD-10-CM | POA: Diagnosis not present

## 2017-05-10 DIAGNOSIS — M0579 Rheumatoid arthritis with rheumatoid factor of multiple sites without organ or systems involvement: Secondary | ICD-10-CM | POA: Diagnosis not present

## 2017-05-10 DIAGNOSIS — M1A0490 Idiopathic chronic gout, unspecified hand, without tophus (tophi): Secondary | ICD-10-CM | POA: Insufficient documentation

## 2017-06-21 DIAGNOSIS — J302 Other seasonal allergic rhinitis: Secondary | ICD-10-CM | POA: Diagnosis not present

## 2017-06-22 DIAGNOSIS — Z79899 Other long term (current) drug therapy: Secondary | ICD-10-CM | POA: Diagnosis not present

## 2017-06-22 DIAGNOSIS — M1A0490 Idiopathic chronic gout, unspecified hand, without tophus (tophi): Secondary | ICD-10-CM | POA: Diagnosis not present

## 2017-06-22 DIAGNOSIS — M0579 Rheumatoid arthritis with rheumatoid factor of multiple sites without organ or systems involvement: Secondary | ICD-10-CM | POA: Diagnosis not present

## 2017-07-03 IMAGING — CT CT ABD-PELV W/ CM
2 of 5 series · 16 of 46 positions shown, 18 images · IV contrast (Omni 300)
Comparison: Operative images yesterday.  MRCP 05/14/2016

CLINICAL DATA: Laparoscopic cholecystectomy yesterday. Right upper
quadrant pain and vomiting postoperatively with persistent pain.

EXAM:
CT ABDOMEN AND PELVIS WITH CONTRAST
TECHNIQUE: Multidetector CT imaging of the abdomen and pelvis was performed
using the standard protocol following bolus administration of
intravenous contrast.
CONTRAST:  100mL 7S3T4D-IWW IOPAMIDOL (7S3T4D-IWW) INJECTION 61%

[Series 2: a/p w/ 5mm · axial · 0.84mm/px · z∈[-534,-99]mm · 13 of 99 slices shown, 15 images]
[im 6/99  soft-tissue]
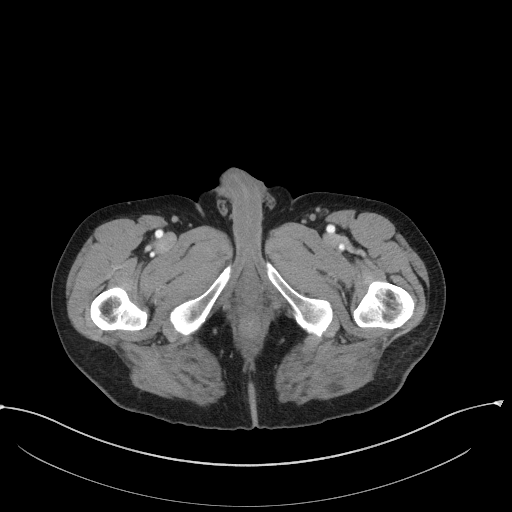
[im 6/99  bone]
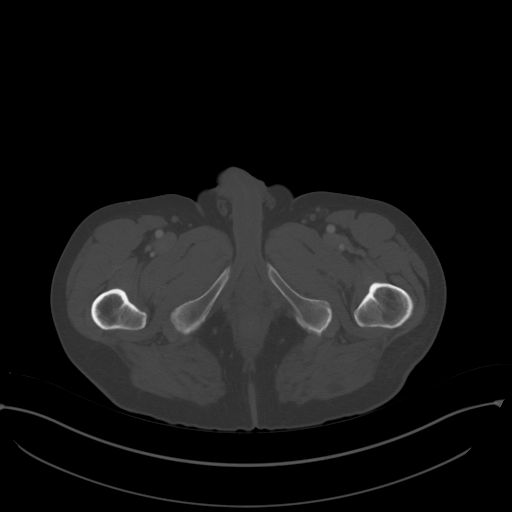
[im 12/99  soft-tissue]
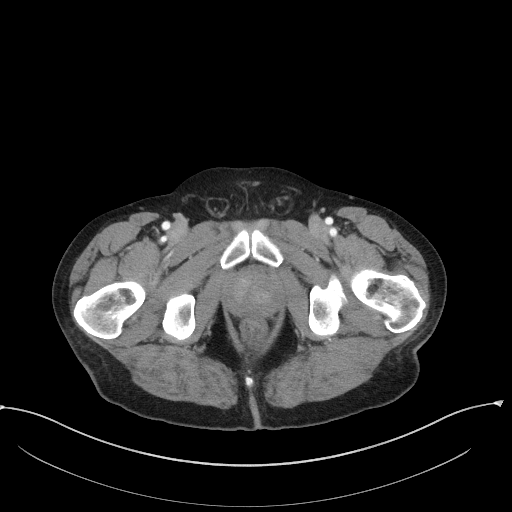
[im 24/99  soft-tissue]
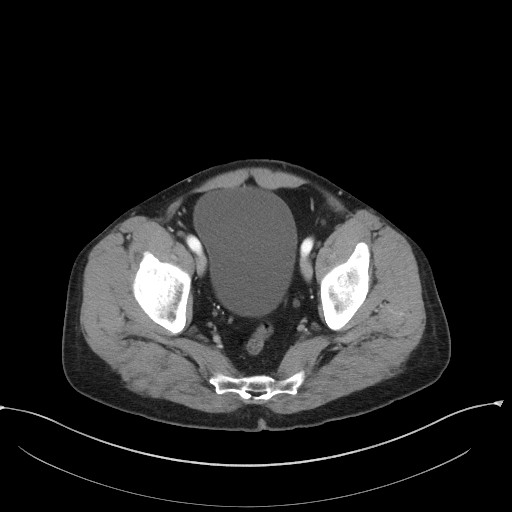
[im 29/99  soft-tissue]
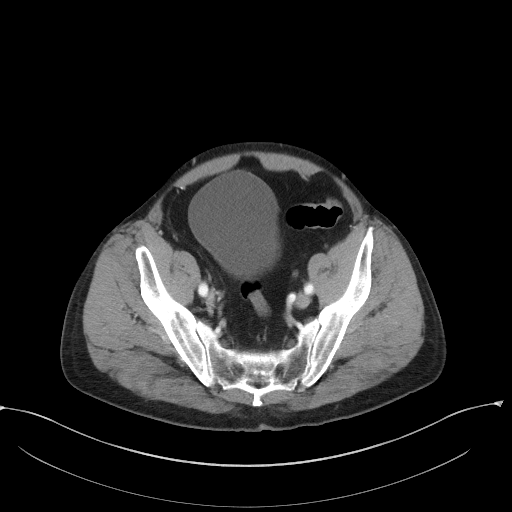
[im 35/99  soft-tissue]
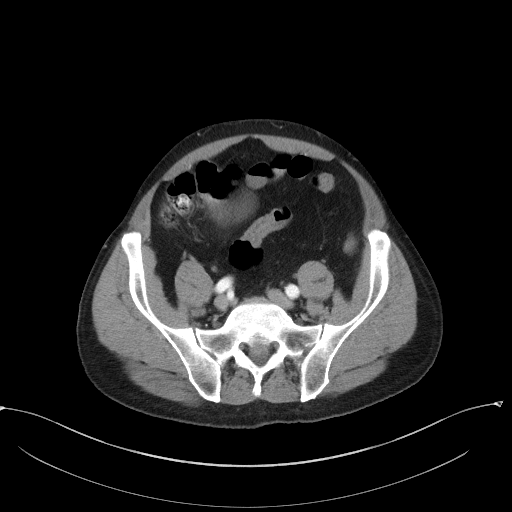
[im 41/99  soft-tissue]
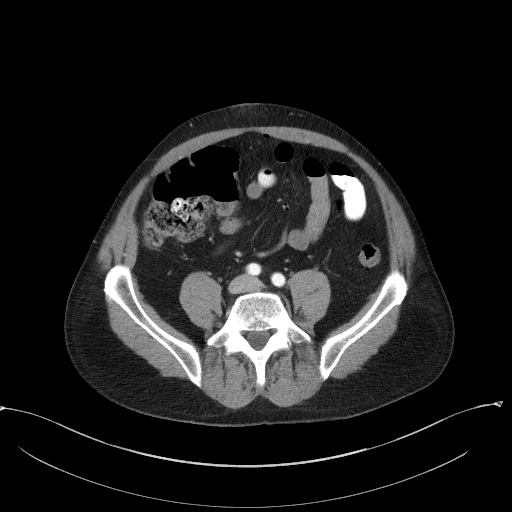
[im 52/99  soft-tissue]
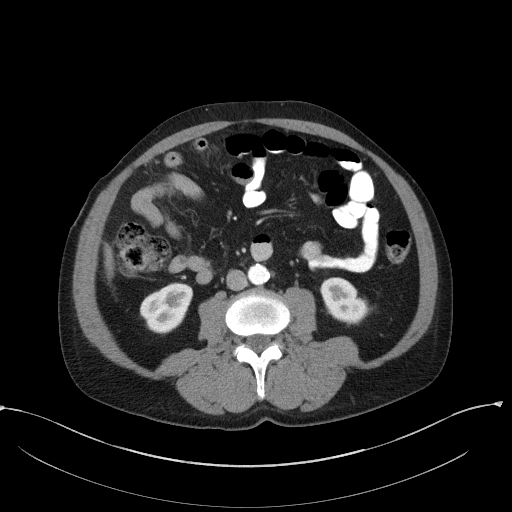
[im 58/99  soft-tissue]
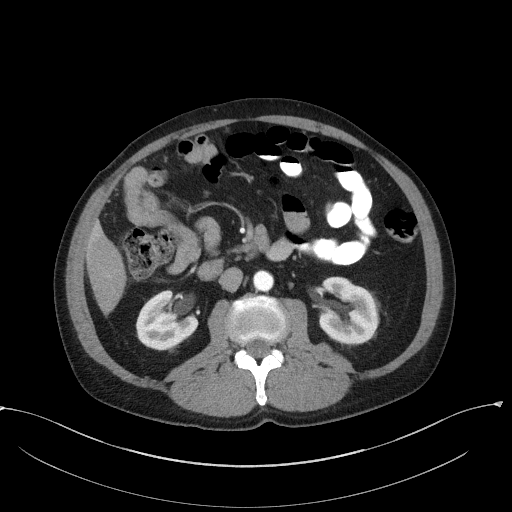
[im 64/99  soft-tissue]
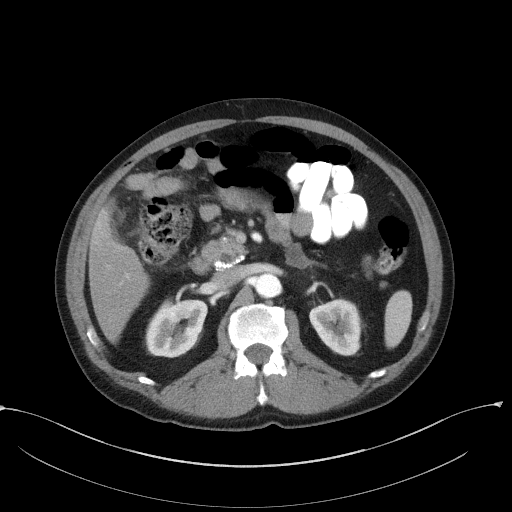
[im 64/99  bone]
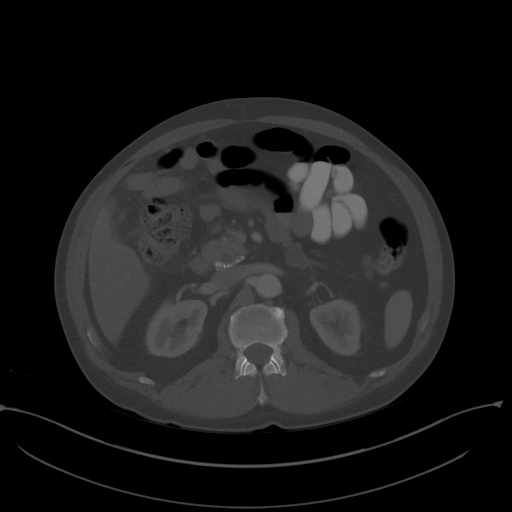
[im 70/99  soft-tissue]
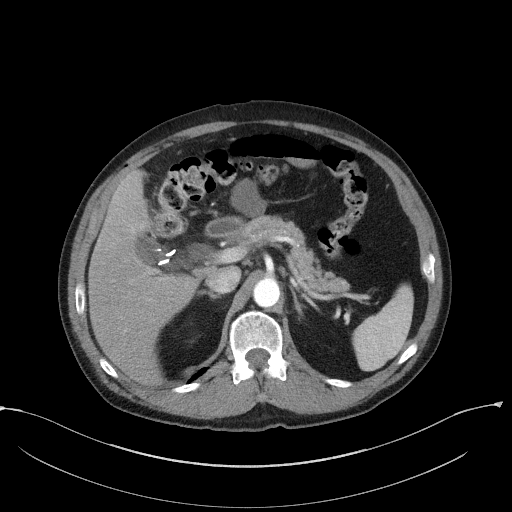
[im 75/99  soft-tissue]
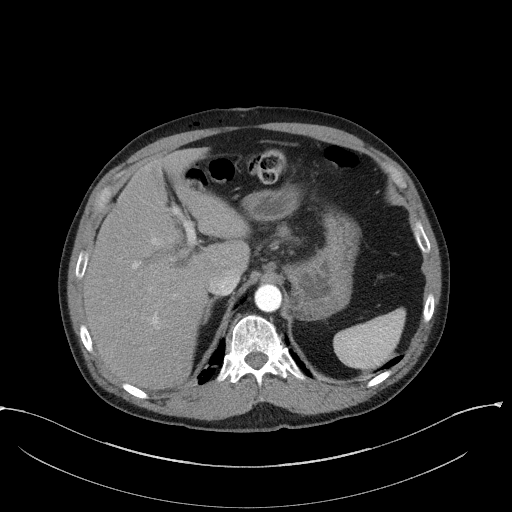
[im 87/99  soft-tissue]
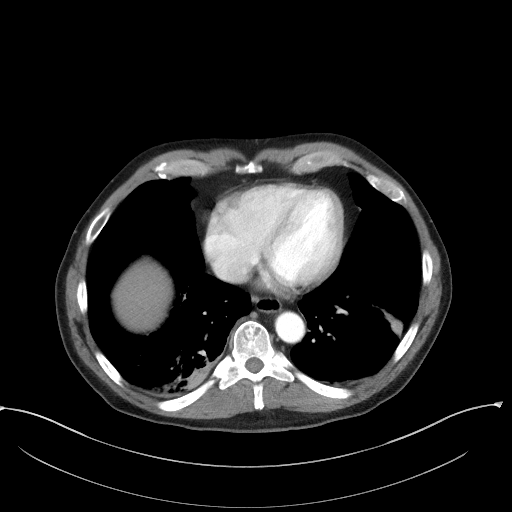
[im 93/99  soft-tissue]
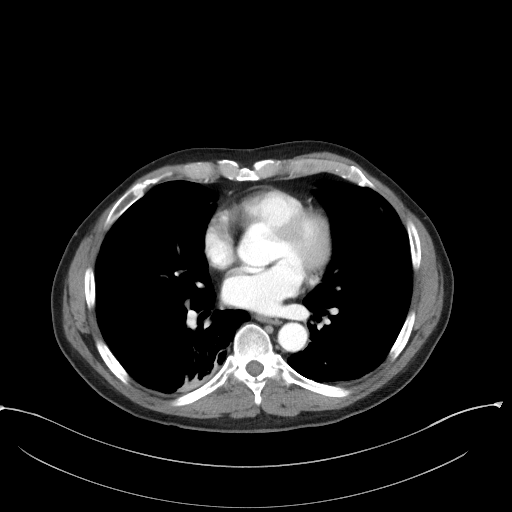

[Series 5: a/p w/ cor · coronal · 0.79mm/px · 3 of 151 slices shown]
[im 51/151  soft-tissue]
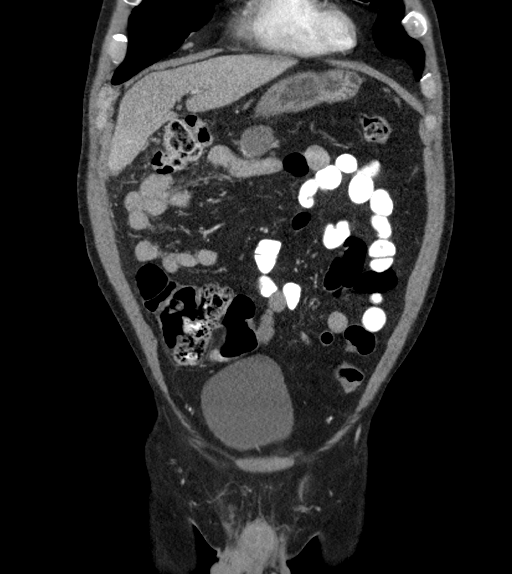
[im 67/151  soft-tissue]
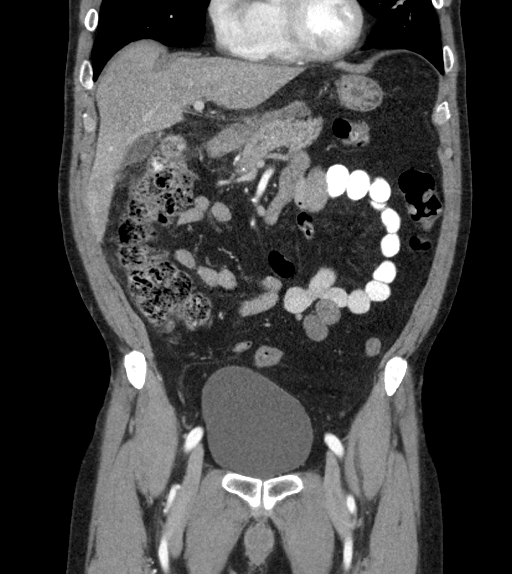
[im 84/151  soft-tissue]
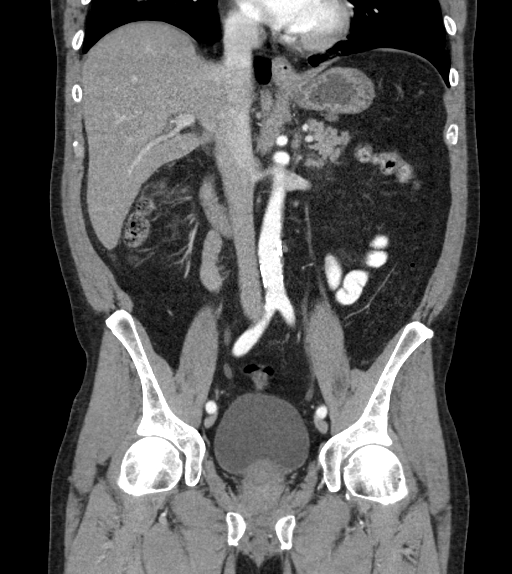

[16 of 46 positions shown; findings below may reference images not displayed]

FINDINGS: Lower chest: There is atelectasis at both lung bases right more than
left. Coronary artery calcification incidentally noted.

Hepatobiliary: Liver parenchyma shows mild fatty change but no focal
lesion. Surgical clips present in the gallbladder be head. Small
amount of fluid in the gallbladder head. These are expected
findings. There is no evidence of hematoma or other an expected
fluid collection. No hyperdense stones in the common duct.

Pancreas: Chronic calcification in the pancreatic head. There is a
3.8 cm low-density fluid collection just ventral to the pancreas,
probably relating to the previous pancreatitis. This is probably not
clinically relevant. No acute pancreatic pathology.

Spleen: Normal

Adrenals/Urinary Tract: Adrenal glands are normal. Kidneys are
normal.

Stomach/Bowel: No intestinal pathology.

Vascular/Lymphatic: Aortic atherosclerosis. No aneurysm. The IVC is
normal. No retroperitoneal mass or lymphadenopathy.

Reproductive: Enlarged prostate gland.

Other: No free fluid or free air. No abnormality seen relating to
laparoscopic approach.

Musculoskeletal: Normal
IMPRESSION: Expected appearance following laparoscopic cholecystectomy. Fluid in
the gallbladder fossa, normal for the first postoperative day. No
detectable complication.

3.8 cm fluid collection ventral the pancreas, residua from the
recent episode of pancreatitis and probably not significant.

Aortic atherosclerosis.

## 2017-07-04 DIAGNOSIS — H35373 Puckering of macula, bilateral: Secondary | ICD-10-CM | POA: Diagnosis not present

## 2017-07-04 DIAGNOSIS — H33311 Horseshoe tear of retina without detachment, right eye: Secondary | ICD-10-CM | POA: Diagnosis not present

## 2017-08-22 DIAGNOSIS — M0579 Rheumatoid arthritis with rheumatoid factor of multiple sites without organ or systems involvement: Secondary | ICD-10-CM | POA: Diagnosis not present

## 2017-08-22 DIAGNOSIS — Z79899 Other long term (current) drug therapy: Secondary | ICD-10-CM | POA: Diagnosis not present

## 2017-08-22 DIAGNOSIS — M1A0490 Idiopathic chronic gout, unspecified hand, without tophus (tophi): Secondary | ICD-10-CM | POA: Diagnosis not present

## 2017-10-05 DIAGNOSIS — M0579 Rheumatoid arthritis with rheumatoid factor of multiple sites without organ or systems involvement: Secondary | ICD-10-CM | POA: Diagnosis not present

## 2017-10-05 DIAGNOSIS — M1009 Idiopathic gout, multiple sites: Secondary | ICD-10-CM | POA: Diagnosis not present

## 2017-10-23 DIAGNOSIS — F17210 Nicotine dependence, cigarettes, uncomplicated: Secondary | ICD-10-CM | POA: Diagnosis not present

## 2017-10-23 DIAGNOSIS — J019 Acute sinusitis, unspecified: Secondary | ICD-10-CM | POA: Diagnosis not present

## 2017-10-23 DIAGNOSIS — M069 Rheumatoid arthritis, unspecified: Secondary | ICD-10-CM | POA: Diagnosis not present

## 2017-11-22 DIAGNOSIS — Z79899 Other long term (current) drug therapy: Secondary | ICD-10-CM | POA: Diagnosis not present

## 2017-11-22 DIAGNOSIS — M0579 Rheumatoid arthritis with rheumatoid factor of multiple sites without organ or systems involvement: Secondary | ICD-10-CM | POA: Diagnosis not present

## 2017-11-22 DIAGNOSIS — M1A0490 Idiopathic chronic gout, unspecified hand, without tophus (tophi): Secondary | ICD-10-CM | POA: Diagnosis not present

## 2018-01-18 DIAGNOSIS — S60111A Contusion of right thumb with damage to nail, initial encounter: Secondary | ICD-10-CM | POA: Diagnosis not present

## 2018-01-18 DIAGNOSIS — S60412A Abrasion of right middle finger, initial encounter: Secondary | ICD-10-CM | POA: Diagnosis not present

## 2018-02-20 DIAGNOSIS — M0579 Rheumatoid arthritis with rheumatoid factor of multiple sites without organ or systems involvement: Secondary | ICD-10-CM | POA: Diagnosis not present

## 2018-02-20 DIAGNOSIS — M1A0490 Idiopathic chronic gout, unspecified hand, without tophus (tophi): Secondary | ICD-10-CM | POA: Diagnosis not present

## 2018-02-20 DIAGNOSIS — M65332 Trigger finger, left middle finger: Secondary | ICD-10-CM | POA: Diagnosis not present

## 2018-02-20 DIAGNOSIS — Z79899 Other long term (current) drug therapy: Secondary | ICD-10-CM | POA: Diagnosis not present

## 2018-05-23 DIAGNOSIS — M0579 Rheumatoid arthritis with rheumatoid factor of multiple sites without organ or systems involvement: Secondary | ICD-10-CM | POA: Diagnosis not present

## 2018-05-23 DIAGNOSIS — Z79899 Other long term (current) drug therapy: Secondary | ICD-10-CM | POA: Diagnosis not present

## 2018-05-23 DIAGNOSIS — M1A0490 Idiopathic chronic gout, unspecified hand, without tophus (tophi): Secondary | ICD-10-CM | POA: Diagnosis not present

## 2018-07-16 DIAGNOSIS — M65332 Trigger finger, left middle finger: Secondary | ICD-10-CM | POA: Diagnosis not present

## 2018-08-20 DIAGNOSIS — Z79899 Other long term (current) drug therapy: Secondary | ICD-10-CM | POA: Diagnosis not present

## 2018-08-20 DIAGNOSIS — M1A0490 Idiopathic chronic gout, unspecified hand, without tophus (tophi): Secondary | ICD-10-CM | POA: Diagnosis not present

## 2018-08-20 DIAGNOSIS — M0579 Rheumatoid arthritis with rheumatoid factor of multiple sites without organ or systems involvement: Secondary | ICD-10-CM | POA: Diagnosis not present

## 2018-10-22 DIAGNOSIS — E782 Mixed hyperlipidemia: Secondary | ICD-10-CM | POA: Diagnosis not present

## 2018-10-22 DIAGNOSIS — I10 Essential (primary) hypertension: Secondary | ICD-10-CM | POA: Diagnosis not present

## 2018-10-22 DIAGNOSIS — F17219 Nicotine dependence, cigarettes, with unspecified nicotine-induced disorders: Secondary | ICD-10-CM | POA: Diagnosis not present

## 2018-10-22 DIAGNOSIS — Z125 Encounter for screening for malignant neoplasm of prostate: Secondary | ICD-10-CM | POA: Diagnosis not present

## 2018-10-22 DIAGNOSIS — Z6827 Body mass index (BMI) 27.0-27.9, adult: Secondary | ICD-10-CM | POA: Diagnosis not present

## 2018-10-22 DIAGNOSIS — Z23 Encounter for immunization: Secondary | ICD-10-CM | POA: Diagnosis not present

## 2018-10-22 DIAGNOSIS — M0579 Rheumatoid arthritis with rheumatoid factor of multiple sites without organ or systems involvement: Secondary | ICD-10-CM | POA: Diagnosis not present

## 2018-12-19 DIAGNOSIS — M0579 Rheumatoid arthritis with rheumatoid factor of multiple sites without organ or systems involvement: Secondary | ICD-10-CM | POA: Diagnosis not present

## 2018-12-19 DIAGNOSIS — M1A0490 Idiopathic chronic gout, unspecified hand, without tophus (tophi): Secondary | ICD-10-CM | POA: Diagnosis not present

## 2018-12-19 DIAGNOSIS — Z79899 Other long term (current) drug therapy: Secondary | ICD-10-CM | POA: Diagnosis not present

## 2019-01-02 DIAGNOSIS — Z6825 Body mass index (BMI) 25.0-25.9, adult: Secondary | ICD-10-CM | POA: Diagnosis not present

## 2019-01-02 DIAGNOSIS — F17219 Nicotine dependence, cigarettes, with unspecified nicotine-induced disorders: Secondary | ICD-10-CM | POA: Diagnosis not present

## 2019-01-02 DIAGNOSIS — Z0001 Encounter for general adult medical examination with abnormal findings: Secondary | ICD-10-CM | POA: Diagnosis not present

## 2019-01-02 DIAGNOSIS — E782 Mixed hyperlipidemia: Secondary | ICD-10-CM | POA: Diagnosis not present

## 2019-01-02 DIAGNOSIS — M05731 Rheumatoid arthritis with rheumatoid factor of right wrist without organ or systems involvement: Secondary | ICD-10-CM | POA: Diagnosis not present

## 2019-01-02 DIAGNOSIS — D72829 Elevated white blood cell count, unspecified: Secondary | ICD-10-CM | POA: Diagnosis not present

## 2019-04-06 DIAGNOSIS — R05 Cough: Secondary | ICD-10-CM | POA: Diagnosis not present

## 2019-04-06 DIAGNOSIS — J029 Acute pharyngitis, unspecified: Secondary | ICD-10-CM | POA: Diagnosis not present

## 2019-04-06 DIAGNOSIS — R509 Fever, unspecified: Secondary | ICD-10-CM | POA: Diagnosis not present

## 2019-04-06 DIAGNOSIS — Z20828 Contact with and (suspected) exposure to other viral communicable diseases: Secondary | ICD-10-CM | POA: Diagnosis not present

## 2019-04-06 DIAGNOSIS — J069 Acute upper respiratory infection, unspecified: Secondary | ICD-10-CM | POA: Diagnosis not present

## 2019-04-29 DIAGNOSIS — M0579 Rheumatoid arthritis with rheumatoid factor of multiple sites without organ or systems involvement: Secondary | ICD-10-CM | POA: Diagnosis not present

## 2019-04-29 DIAGNOSIS — Z79899 Other long term (current) drug therapy: Secondary | ICD-10-CM | POA: Diagnosis not present

## 2019-04-29 DIAGNOSIS — M1A0490 Idiopathic chronic gout, unspecified hand, without tophus (tophi): Secondary | ICD-10-CM | POA: Diagnosis not present

## 2019-06-07 DIAGNOSIS — K649 Unspecified hemorrhoids: Secondary | ICD-10-CM | POA: Diagnosis not present

## 2019-06-11 DIAGNOSIS — F17219 Nicotine dependence, cigarettes, with unspecified nicotine-induced disorders: Secondary | ICD-10-CM | POA: Diagnosis not present

## 2019-06-11 DIAGNOSIS — K648 Other hemorrhoids: Secondary | ICD-10-CM | POA: Diagnosis not present

## 2019-06-11 DIAGNOSIS — M0579 Rheumatoid arthritis with rheumatoid factor of multiple sites without organ or systems involvement: Secondary | ICD-10-CM | POA: Diagnosis not present

## 2019-06-11 DIAGNOSIS — Z23 Encounter for immunization: Secondary | ICD-10-CM | POA: Diagnosis not present

## 2019-06-11 DIAGNOSIS — I10 Essential (primary) hypertension: Secondary | ICD-10-CM | POA: Diagnosis not present

## 2019-07-30 DIAGNOSIS — Z79899 Other long term (current) drug therapy: Secondary | ICD-10-CM | POA: Diagnosis not present

## 2019-07-30 DIAGNOSIS — M0579 Rheumatoid arthritis with rheumatoid factor of multiple sites without organ or systems involvement: Secondary | ICD-10-CM | POA: Diagnosis not present

## 2019-07-30 DIAGNOSIS — M1A0490 Idiopathic chronic gout, unspecified hand, without tophus (tophi): Secondary | ICD-10-CM | POA: Diagnosis not present

## 2019-09-20 DIAGNOSIS — M67432 Ganglion, left wrist: Secondary | ICD-10-CM | POA: Diagnosis not present

## 2019-10-21 DIAGNOSIS — R05 Cough: Secondary | ICD-10-CM | POA: Diagnosis not present

## 2019-10-21 DIAGNOSIS — U071 COVID-19: Secondary | ICD-10-CM | POA: Diagnosis not present

## 2019-10-22 ENCOUNTER — Other Ambulatory Visit: Payer: Self-pay | Admitting: Unknown Physician Specialty

## 2019-10-22 ENCOUNTER — Telehealth: Payer: Self-pay | Admitting: Unknown Physician Specialty

## 2019-10-22 DIAGNOSIS — U071 COVID-19: Secondary | ICD-10-CM

## 2019-10-22 NOTE — Telephone Encounter (Signed)
  I connected by phone with Gerald Odom on 10/22/2019 at 12:53 PM to discuss the potential use of an new treatment for mild to moderate COVID-19 viral infection in non-hospitalized patients.  This patient is a 69 y.o. male that meets the FDA criteria for Emergency Use Authorization of bamlanivimab or casirivimab\imdevimab.  Has a (+) direct SARS-CoV-2 viral test result  Has mild or moderate COVID-19   Is ? 71 years of age and weighs ? 40 kg  Is NOT hospitalized due to COVID-19  Is NOT requiring oxygen therapy or requiring an increase in baseline oxygen flow rate due to COVID-19  Is within 10 days of symptom onset  Has at least one of the high risk factor(s) for progression to severe COVID-19 and/or hospitalization as defined in EUA.  Specific high risk criteria : >/= 71 yo   I have spoken and communicated the following to the patient or parent/caregiver:  1. FDA has authorized the emergency use of bamlanivimab and casirivimab\imdevimab for the treatment of mild to moderate COVID-19 in adults and pediatric patients with positive results of direct SARS-CoV-2 viral testing who are 78 years of age and older weighing at least 40 kg, and who are at high risk for progressing to severe COVID-19 and/or hospitalization.  2. The significant known and potential risks and benefits of bamlanivimab and casirivimab\imdevimab, and the extent to which such potential risks and benefits are unknown.  3. Information on available alternative treatments and the risks and benefits of those alternatives, including clinical trials.  4. Patients treated with bamlanivimab and casirivimab\imdevimab should continue to self-isolate and use infection control measures (e.g., wear mask, isolate, social distance, avoid sharing personal items, clean and disinfect "high touch" surfaces, and frequent handwashing) according to CDC guidelines.   5. The patient or parent/caregiver has the option to accept or refuse  bamlanivimab or casirivimab\imdevimab .  After reviewing this information with the patient, The patient agreed to proceed with receiving the bamlanimivab infusion and will be provided a copy of the Fact sheet prior to receiving the infusion.Kathrine Haddock 10/22/2019 12:53 PM

## 2019-10-25 ENCOUNTER — Ambulatory Visit (HOSPITAL_COMMUNITY)
Admission: RE | Admit: 2019-10-25 | Discharge: 2019-10-25 | Disposition: A | Discharge status: Home / Self Care | Payer: Medicare Other | Source: Ambulatory Visit | Attending: Pulmonary Disease | Admitting: Pulmonary Disease

## 2019-10-25 DIAGNOSIS — U071 COVID-19: Secondary | ICD-10-CM | POA: Insufficient documentation

## 2019-10-25 DIAGNOSIS — Z23 Encounter for immunization: Secondary | ICD-10-CM | POA: Insufficient documentation

## 2019-10-25 MED ORDER — ALBUTEROL SULFATE HFA 108 (90 BASE) MCG/ACT IN AERS: 2.0000 | INHALATION_SPRAY | Freq: Once | RESPIRATORY_TRACT | Status: DC | PRN

## 2019-10-25 MED ORDER — SODIUM CHLORIDE 0.9 % IV SOLN
700.0000 mg | Freq: Once | INTRAVENOUS | Status: AC
  Administered 2019-10-25: 15:00:00 700 mg via INTRAVENOUS
  Filled 2019-10-25: qty 20

## 2019-10-25 MED ORDER — SODIUM CHLORIDE 0.9 % IV SOLN
INTRAVENOUS | Status: DC | PRN
  Administered 2019-10-25: 250 mL via INTRAVENOUS

## 2019-10-25 MED ORDER — FAMOTIDINE IN NACL 20-0.9 MG/50ML-% IV SOLN: 20.0000 mg | Freq: Once | INTRAVENOUS | Status: DC | PRN

## 2019-10-25 MED ORDER — DIPHENHYDRAMINE HCL 50 MG/ML IJ SOLN: 50.0000 mg | Freq: Once | INTRAMUSCULAR | Status: DC | PRN

## 2019-10-25 MED ORDER — METHYLPREDNISOLONE SODIUM SUCC 125 MG IJ SOLR: 125.0000 mg | Freq: Once | INTRAMUSCULAR | Status: DC | PRN

## 2019-10-25 MED ORDER — EPINEPHRINE 0.3 MG/0.3ML IJ SOAJ: 0.3000 mg | Freq: Once | INTRAMUSCULAR | Status: DC | PRN

## 2019-10-25 NOTE — Discharge Instructions (Signed)

## 2019-10-25 NOTE — Progress Notes (Signed)
  Diagnosis: COVID-19  Physician: Dr. Joya Gaskins  Procedure: [facility] Med: bamlanivimab infusion - Provided patient with bamlanimivab fact sheet for patients, parents and caregivers prior to infusion.  Complications: No immediate complications noted.  Discharge: Discharged home   Babs Sciara 10/25/2019

## 2019-11-13 DIAGNOSIS — M0579 Rheumatoid arthritis with rheumatoid factor of multiple sites without organ or systems involvement: Secondary | ICD-10-CM | POA: Diagnosis not present

## 2019-11-13 DIAGNOSIS — M1A0490 Idiopathic chronic gout, unspecified hand, without tophus (tophi): Secondary | ICD-10-CM | POA: Diagnosis not present

## 2019-11-13 DIAGNOSIS — Z79899 Other long term (current) drug therapy: Secondary | ICD-10-CM | POA: Diagnosis not present

## 2019-12-13 ENCOUNTER — Other Ambulatory Visit: Payer: Self-pay

## 2019-12-13 ENCOUNTER — Encounter: Payer: Self-pay | Admitting: Family Medicine

## 2019-12-13 ENCOUNTER — Ambulatory Visit (INDEPENDENT_AMBULATORY_CARE_PROVIDER_SITE_OTHER): Payer: PPO | Admitting: Family Medicine

## 2019-12-13 VITALS — BP 124/72 | HR 84 | Temp 98.0°F | Resp 16 | Ht 67.0 in | Wt 166.2 lb

## 2019-12-13 DIAGNOSIS — R0789 Other chest pain: Secondary | ICD-10-CM

## 2019-12-14 ENCOUNTER — Encounter: Payer: Self-pay | Admitting: Family Medicine

## 2019-12-14 DIAGNOSIS — R0789 Other chest pain: Secondary | ICD-10-CM | POA: Insufficient documentation

## 2019-12-14 NOTE — Assessment & Plan Note (Signed)
Start celebrex 200 mg once daily.  Try methocarbamol one four times a day as needed for pain.  Continue ice.  Continue splinting when sneezes or coughs.

## 2019-12-14 NOTE — Progress Notes (Signed)
Acute Office Visit  Subjective:    Patient ID: Gerald Odom, male    DOB: 09-Dec-1950, 69 y.o.   MRN: [id number]  Chief Complaint  Patient presents with  . Muscle Pain    HPI Patient is in today for pulled chest muscle. He was cranking a chain saw and felt a pull in his left side chest pain. It hurts with coughing. He has iced the area. He has taken methocarbamol of his wifes. It helped some. He is taking celebrex prn, but has not taken for this particular pain.  Past Medical History:  Diagnosis Date  . Arthritis    Rheumatoid arthritis  . Gout   . Hyperlipidemia   . Hypertension   . Pancreatitis 05/2016   hosp. [facility] [place]  . Pneumonia 05/2016    Past Surgical History:  Procedure Laterality Date  . CHOLECYSTECTOMY    . CHOLECYSTECTOMY N/A 07/04/2016   Procedure: LAPAROSCOPIC CHOLECYSTECTOMY WITH INTRAOPERATIVE CHOLANGIOGRAM;  Surgeon: Jackolyn Confer, MD;  Location: [place];  Service: General;  Laterality: N/A;  . ERCP N/A 05/16/2016   Procedure: ENDOSCOPIC RETROGRADE CHOLANGIOPANCREATOGRAPHY (ERCP);  Surgeon: Doran Stabler, MD;  Location: [facility];  Service: Endoscopy;  Laterality: N/A;  . KNEE SURGERY Left 1997   Family History  Problem Relation Age of Onset  . Anxiety disorder Father    Family History  Problem Relation Age of Onset  . Anxiety disorder Father     Social History   Socioeconomic History  . Marital status: Married    Spouse name: Not on file  . Number of children: Not on file  . Years of education: Not on file  . Highest education level: Not on file  Occupational History  . Not on file  Tobacco Use  . Smoking status: Current Some Day Smoker    Types: Cigarettes    Last attempt to quit: 01/28/2016    Years since quitting: 3.8  . Smokeless tobacco: Former Network engineer and Sexual Activity  . Alcohol use: No  . Drug use: No  . Sexual activity: Not on file  Other Topics Concern  . Not on file  Social History Narrative  . Not on file    Social Determinants of Health   Financial Resource Strain:   . Difficulty of Paying Living Expenses: Not on file  Food Insecurity:   . Worried About Charity fundraiser in the Last Year: Not on file  . Ran Out of Food in the Last Year: Not on file  Transportation Needs:   . Lack of Transportation (Medical): Not on file  . Lack of Transportation (Non-Medical): Not on file  Physical Activity:   . Days of Exercise per Week: Not on file  . Minutes of Exercise per Session: Not on file  Stress:   . Feeling of Stress : Not on file  Social Connections:   . Frequency of Communication with Friends and Family: Not on file  . Frequency of Social Gatherings with Friends and Family: Not on file  . Attends Religious Services: Not on file  . Active Member of Clubs or Organizations: Not on file  . Attends Archivist Meetings: Not on file  . Marital Status: Not on file  Intimate Partner Violence:   . Fear of Current or Ex-Partner: Not on file  . Emotionally Abused: Not on file  . Physically Abused: Not on file  . Sexually Abused: Not on file    Outpatient Medications Prior to Visit  Medication Sig Dispense Refill  . allopurinol (ZYLOPRIM) 300 MG tablet Take 300 mg by mouth daily before breakfast.     . celecoxib (CELEBREX) 100 MG capsule Take 200 mg by mouth.     . Etanercept (ENBREL) 25 MG/0.5ML SOSY Enbrel 25 mg/0.5 mL (0.5 mL) subcutaneous syringe    . folic acid (FOLVITE) 1 MG tablet Take by mouth.    Marland Kitchen lisinopril (PRINIVIL,ZESTRIL) 5 MG tablet Take 1 tablet (5 mg total) by mouth daily. 30 tablet 0  . methotrexate (RHEUMATREX) 2.5 MG tablet Take 10 mg by mouth once a week.    . CHANTIX 1 MG tablet Take 1 mg by mouth 2 (two) times daily.    . colchicine 0.6 MG tablet Take 0.6 mg by mouth daily before breakfast.     . diphenhydramine-acetaminophen (TYLENOL PM) 25-500 MG TABS tablet Take 2 tablets by mouth at bedtime as needed (for sleep.).    Marland Kitchen ondansetron (ZOFRAN ODT) 4 MG  disintegrating tablet Take 1 tablet (4 mg total) by mouth every 8 (eight) hours as needed for nausea or vomiting. 20 tablet 0  . oxyCODONE (OXY IR/ROXICODONE) 5 MG immediate release tablet Take 1-2 tablets (5-10 mg total) by mouth every 4 (four) hours as needed for moderate pain, severe pain or breakthrough pain. 30 tablet 0  . polyethylene glycol (MIRALAX / GLYCOLAX) packet Take 17 g by mouth daily as needed for moderate constipation.     No facility-administered medications prior to visit.    No Known Allergies  Review of Systems  Constitutional: Negative for chills, fever and malaise/fatigue.  HENT: Negative for congestion, ear pain and sore throat.   Respiratory: Negative for shortness of breath.   Cardiovascular: Negative for chest pain.   Review of Systems  Constitutional: Negative for chills, fever and malaise/fatigue.  HENT: Negative for congestion, ear pain and sore throat.   Respiratory: Negative for shortness of breath.   Cardiovascular: Negative for chest pain.      Objective:    Physical Exam  BP 124/72   Pulse 84   Temp 98 F (36.7 C)   Resp 16   Ht 5\' 7"  (1.702 m)   Wt 166 lb 3.2 oz (75.4 kg)   BMI 26.03 kg/m  Wt Readings from Last 3 Encounters:  12/13/19 166 lb 3.2 oz (75.4 kg)  07/05/16 161 lb (73 kg)  07/04/16 160 lb (72.6 kg)     Physical Exam Vitals reviewed.  Constitutional:      Appearance: Normal appearance.  Musculoskeletal: tender over costosternal joint on left side. FROM of left shoulder without pain. Nontender over left shoulder.  Cardiovascular:     Rate and Rhythm: Normal rate and regular rhythm.     Pulses: Normal pulses.     Heart sounds: Normal heart sounds.  Pulmonary:     Effort: Pulmonary effort is normal.     Breath sounds: Normal breath sounds. No wheezing, rhonchi or rales.  Neurological:     Mental Status: ALERT Psychiatric:        Mood and Affect: Mood normal.        Behavior: Behavior normal.    Health Maintenance  Due  Topic Date Due  . Hepatitis C Screening  10-13-1951  . COLONOSCOPY  12/21/1998  . PNA vac Low Risk Adult (1 of 2 - PCV13) 12/20/2013    There are no preventive care reminders to display for this patient.   No results found for: TSH Lab Results  Component Value Date  WBC 18.4 (H) 07/05/2016   HGB 14.2 07/05/2016   HCT 42.0 07/05/2016   MCV 94.4 07/05/2016   PLT 243 07/05/2016   Lab Results  Component Value Date   NA 140 07/05/2016   K 3.8 07/05/2016   CO2 24 07/05/2016   GLUCOSE 123 (H) 07/05/2016   BUN 7 07/05/2016   CREATININE 0.87 07/05/2016   BILITOT 0.8 07/05/2016   ALKPHOS 69 07/05/2016   AST 39 07/05/2016   ALT 49 07/05/2016   PROT 6.8 07/05/2016   ALBUMIN 3.7 07/05/2016   CALCIUM 9.3 07/05/2016   ANIONGAP 9 07/05/2016   No results found for: CHOL No results found for: HDL No results found for: LDLCALC No results found for: TRIG No results found for: CHOLHDL No results found for: HGBA1C     Assessment & Plan:   Problem List Items Addressed This Visit      Other   Acute chest wall pain - Primary    Start celebrex 200 mg once daily.  Try methocarbamol one four times a day as needed for pain.  Continue ice.  Continue splinting when sneezes or coughs.          No orders of the defined types were placed in this encounter.    [name], MD

## 2019-12-14 NOTE — Patient Instructions (Signed)
Acute chest wall pain Start celebrex 200 mg once daily.  Try methocarbamol one four times a day as needed for pain.  Continue ice.  Continue splinting when sneezes or coughs.

## 2020-01-06 ENCOUNTER — Ambulatory Visit: Payer: Medicare Other | Admitting: Family Medicine

## 2020-01-13 ENCOUNTER — Other Ambulatory Visit: Payer: Self-pay | Admitting: Family Medicine

## 2020-01-29 NOTE — Progress Notes (Signed)
Established Patient Office Visit  Subjective:  Patient ID: Gerald Odom, male    DOB: 12-Dec-1950  Age: 69 y.o. MRN: [id number]  CC:  Chief Complaint  Patient presents with  . Medicare Wellness    Colonoscopy 12/28/2016; PSA 01/19/2017 2.5, PHQ-2, Alcohol Normal, Falls Normal.    HPI Medicare Physical ("At Risk" items are starred): Patient's last physical exam was 3 years ago.   Medical History: Patient history reviewed; Family history reviewed;  Allergies Reviewed: No change in current allergies;  Medications Reviewed: Medications reviewed - no changes; The patient underwent a colonoscopy in 12/2016 with normal results.   Patient's last eye exam was in 12/2016.   dental-10/2016 Patient is current with their Pneumococcal 23, influenza, Prevnar 13 and Zostavax.  She/he is not current with TdaP immunization.   Cognitive Impairment: General Appearance: Normal; Mood/Affect: Normal; Cognitive Impairment Testing: Normal;  Depression Screening: Negative for depression; is ANHEDONIA:  0 (01/18/2017), DEPRESSION:  0 (01/18/2017);  Functional Ability: Does the patient exhibit a steady gait? ( yes ), Is the patient self reliant? (Can perform ADL's) ( patient is to bathe unassisted, dress unassisted prepare meals unassisted, ), Does the patient handle their own medication? ( yes ), Does the patient handle their own money? ( yes ), Any hearing difficulties? ( yes ), Any vision difficulties? ( no ), Is patient at risk for falls ( Fall Risk:  No (01/18/2017), Falls per year:  0 (01/18/2017), Reason for fall:  0 (01/18/2017) );  Advanced Care Planning: Was Advanced Care Planning discussed? ( yes ); Does the patient have Advanced Directives? ( no ); Information was given to the patient on Advanced Directives ( yes );  Smoking: ** Currently smokes ( 1ppw );  Nutrition: Fairly healthy diet;  Physical Activity: Exercises with work  Alcohol/Drug Use: Is a non-drinker; No illicit drug use;  Safety: Practice gun  safety;  Always wears seatbelt;   Past Medical History:  Diagnosis Date  . Arthritis    Rheumatoid arthritis  . Gout   . Hyperlipidemia   . Hypertension   . Pancreatitis 05/2016   hosp. [facility]  . Pneumonia 05/2016    Past Surgical History:  Procedure Laterality Date  . CHOLECYSTECTOMY    . CHOLECYSTECTOMY N/A 07/04/2016   Procedure: LAPAROSCOPIC CHOLECYSTECTOMY WITH INTRAOPERATIVE CHOLANGIOGRAM;  Surgeon: Jackolyn Confer, MD;  Location: [place];  Service: General;  Laterality: N/A;  . ERCP N/A 05/16/2016   Procedure: ENDOSCOPIC RETROGRADE CHOLANGIOPANCREATOGRAPHY (ERCP);  Surgeon: Doran Stabler, MD;  Location: [facility];  Service: Endoscopy;  Laterality: N/A;  . KNEE SURGERY Left 1997    Family History  Problem Relation Age of Onset  . Anxiety disorder Father     Social History   Socioeconomic History  . Marital status: Married    Spouse name: Not on file  . Number of children: Not on file  . Years of education: Not on file  . Highest education level: Not on file  Occupational History  . Not on file  Tobacco Use  . Smoking status: Current Every Day Smoker    Types: Cigarettes  . Smokeless tobacco: Former Network engineer and Sexual Activity  . Alcohol use: No  . Drug use: No  . Sexual activity: Not on file  Other Topics Concern  . Not on file  Social History Narrative  . Not on file   Social Determinants of Health   Financial Resource Strain:   . Difficulty of Paying Living Expenses:  Food Insecurity:   . Worried About Charity fundraiser in the Last Year:   . Arboriculturist in the Last Year:   Transportation Needs:   . Film/video editor (Medical):   Marland Kitchen Lack of Transportation (Non-Medical):   Physical Activity:   . Days of Exercise per Week:   . Minutes of Exercise per Session:   Stress:   . Feeling of Stress :   Social Connections:   . Frequency of Communication with Friends and Family:   . Frequency of Social Gatherings with Friends and  Family:   . Attends Religious Services:   . Active Member of Clubs or Organizations:   . Attends Archivist Meetings:   Marland Kitchen Marital Status:   Intimate Partner Violence:   . Fear of Current or Ex-Partner:   . Emotionally Abused:   Marland Kitchen Physically Abused:   . Sexually Abused:     Outpatient Medications Prior to Visit  Medication Sig Dispense Refill  . allopurinol (ZYLOPRIM) 300 MG tablet Take 300 mg by mouth daily before breakfast.     . celecoxib (CELEBREX) 100 MG capsule Take 200 mg by mouth.     . Etanercept (ENBREL) 25 MG/0.5ML SOSY Enbrel 25 mg/0.5 mL (0.5 mL) subcutaneous syringe    . folic acid (FOLVITE) 1 MG tablet Take by mouth.    Marland Kitchen lisinopril (ZESTRIL) 5 MG tablet TAKE ONE (1) TABLET BY MOUTH ONCE DAILY 90 tablet 1  . methotrexate (RHEUMATREX) 2.5 MG tablet Take 10 mg by mouth once a week.     No facility-administered medications prior to visit.    No Known Allergies  ROS Review of Systems  Constitutional: Negative for chills, diaphoresis, fatigue and fever.  HENT: Negative for congestion, ear pain and sore throat.   Respiratory: Negative for cough and shortness of breath.   Cardiovascular: Negative for chest pain and leg swelling.  Gastrointestinal: Negative for abdominal pain, constipation, diarrhea, nausea and vomiting.  Genitourinary: Negative for dysuria and urgency.  Musculoskeletal: Positive for arthralgias. Negative for myalgias.       Stiff in am. Has RA.   Neurological: Negative for dizziness and headaches.  Psychiatric/Behavioral: Negative for dysphoric mood.      Objective:    Physical Exam  Constitutional: He is oriented to person, place, and time. He appears well-developed and well-nourished.  HENT:  Right Ear: Tympanic membrane, external ear and ear canal normal.  Left Ear: External ear normal.  Ears:  Nose: Nose normal.  Mouth/Throat: Oropharynx is clear and moist. No oropharyngeal exudate.  Cardiovascular: Normal rate, regular rhythm  and normal heart sounds.  Pulmonary/Chest: Effort normal and breath sounds normal. No respiratory distress.  Abdominal: Soft. Bowel sounds are normal. There is no abdominal tenderness.  Musculoskeletal:        General: Tenderness and deformity present.     Comments: Firm nodule on left posterior proximal lower arm. Tender.  Neurological: He is alert and oriented to person, place, and time.  Skin: No rash noted.  Psychiatric: He has a normal mood and affect. His behavior is normal.    BP 118/78 (BP Location: Right Arm, Patient Position: Sitting)   Pulse 84   Temp (!) 96.7 F (35.9 C) (Temporal)   Ht 5\' 7"  (1.702 m)   Wt 162 lb (73.5 kg)   SpO2 98%   BMI 25.37 kg/m  Wt Readings from Last 3 Encounters:  01/30/20 162 lb (73.5 kg)  12/13/19 166 lb 3.2 oz (75.4 kg)  07/05/16 161 lb (73 kg)     Health Maintenance Due  Topic Date Due  . Hepatitis C Screening  Never done  . COVID-19 Vaccine (1) Never done  . PNA vac Low Risk Adult (2 of 2 - PPSV23) 09/17/2018    There are no preventive care reminders to display for this patient.  Lab Results  Component Value Date   TSH 1.880 01/30/2020   Lab Results  Component Value Date   WBC 18.4 (H) 07/05/2016   HGB 14.2 07/05/2016   HCT 42.0 07/05/2016   MCV 94.4 07/05/2016   PLT 243 07/05/2016   Lab Results  Component Value Date   NA 140 07/05/2016   K 3.8 07/05/2016   CO2 24 07/05/2016   GLUCOSE 123 (H) 07/05/2016   BUN 7 07/05/2016   CREATININE 0.87 07/05/2016   BILITOT 0.8 07/05/2016   ALKPHOS 69 07/05/2016   AST 39 07/05/2016   ALT 49 07/05/2016   PROT 6.8 07/05/2016   ALBUMIN 3.7 07/05/2016   CALCIUM 9.3 07/05/2016   ANIONGAP 9 07/05/2016   Lab Results  Component Value Date   CHOL 140 01/30/2020   Lab Results  Component Value Date   HDL 40 01/30/2020   Lab Results  Component Value Date   LDLCALC 78 01/30/2020   Lab Results  Component Value Date   TRIG 121 01/30/2020   Lab Results  Component Value Date    CHOLHDL 3.5 01/30/2020   No results found for: HGBA1C    Assessment & Plan:  1. Encounter for general adult medical examination with abnormal findings  These are the goals we discussed: Goals    . Quit Smoking     Discussed methods to quit.  Education given.       This is a list of the screening recommended for you and due dates:  Health Maintenance  Topic Date Due  .  Hepatitis C: One time screening is recommended by Center for Disease Control  (CDC) for  adults born from [birth year] through 1967.   Never done  . COVID-19 Vaccine (1) Never done  . Pneumonia vaccines (2 of 2 - PPSV23) 09/17/2018  . Flu Shot  05/17/2020  . Colon Cancer Screening  12/29/2026  . Tetanus Vaccine  01/01/2029    2. Essential hypertension, benign .Well controlled.  No changes to medicines.  Continue to work on eating a healthy diet and exercise.  Labs drawn today.  - TSH - Lipid panel  3. Prostate cancer screening - PSA  4. Rheumatoid arthritis involving multiple sites with positive rheumatoid factor (HCC) Check labs. Keep follow up with rheumatology. Call rheumatology about elbow nodule.   5. Cigarette nicotine dependence with nicotine-induced disorder Discussed quitting and a plan to cut down on cigarettes.  6. Hearing loss of left ear due to cerumen impaction Left ear has wax. Recommend otc debrox as directed. If does not improve, may call and our nurses can irrigate it out.    Follow-up: Return in about 6 months (around 07/31/2020) for fasting.[name], MD

## 2020-01-30 ENCOUNTER — Ambulatory Visit (INDEPENDENT_AMBULATORY_CARE_PROVIDER_SITE_OTHER): Payer: PPO | Admitting: Family Medicine

## 2020-01-30 ENCOUNTER — Encounter: Payer: Self-pay | Admitting: Family Medicine

## 2020-01-30 ENCOUNTER — Other Ambulatory Visit: Payer: Self-pay

## 2020-01-30 VITALS — BP 118/78 | HR 84 | Temp 96.7°F | Ht 67.0 in | Wt 162.0 lb

## 2020-01-30 DIAGNOSIS — M0579 Rheumatoid arthritis with rheumatoid factor of multiple sites without organ or systems involvement: Secondary | ICD-10-CM | POA: Diagnosis not present

## 2020-01-30 DIAGNOSIS — H6122 Impacted cerumen, left ear: Secondary | ICD-10-CM | POA: Diagnosis not present

## 2020-01-30 DIAGNOSIS — Z125 Encounter for screening for malignant neoplasm of prostate: Secondary | ICD-10-CM | POA: Insufficient documentation

## 2020-01-30 DIAGNOSIS — I10 Essential (primary) hypertension: Secondary | ICD-10-CM

## 2020-01-30 DIAGNOSIS — Z0001 Encounter for general adult medical examination with abnormal findings: Secondary | ICD-10-CM

## 2020-01-30 DIAGNOSIS — F17219 Nicotine dependence, cigarettes, with unspecified nicotine-induced disorders: Secondary | ICD-10-CM | POA: Diagnosis not present

## 2020-01-30 DIAGNOSIS — Z23 Encounter for immunization: Secondary | ICD-10-CM | POA: Insufficient documentation

## 2020-01-30 NOTE — Patient Instructions (Addendum)
Left ear has wax. Recommend otc debrox as directed. If does not improve, may call and our nurses can irrigate it out.  Recommend get advanced directives. Recommend quit smoking in the next 6 months! Call rheumatology about elbow nodule.     Preventive Care 21 Years and Older, Male Preventive care refers to lifestyle choices and visits with your health care provider that can promote health and wellness. This includes:  A yearly physical exam. This is also called an annual well check.  Regular dental and eye exams.  Immunizations.  Screening for certain conditions.  Healthy lifestyle choices, such as diet and exercise. What can I expect for my preventive care visit? Physical exam Your health care provider will check:  Height and weight. These may be used to calculate body mass index (BMI), which is a measurement that tells if you are at a healthy weight.  Heart rate and blood pressure.  Your skin for abnormal spots. Counseling Your health care provider may ask you questions about:  Alcohol, tobacco, and drug use.  Emotional well-being.  Home and relationship well-being.  Sexual activity.  Eating habits.  History of falls.  Memory and ability to understand (cognition).  Work and work Statistician. What immunizations do I need?  Influenza (flu) vaccine  This is recommended every year. Tetanus, diphtheria, and pertussis (Tdap) vaccine  You may need a Td booster every 10 years. Varicella (chickenpox) vaccine  You may need this vaccine if you have not already been vaccinated. Zoster (shingles) vaccine  You may need this after age 5. Pneumococcal conjugate (PCV13) vaccine  One dose is recommended after age 1. Pneumococcal polysaccharide (PPSV23) vaccine  One dose is recommended after age 49. Measles, mumps, and rubella (MMR) vaccine  You may need at least one dose of MMR if you were born in 1957 or later. You may also need a second dose. Meningococcal  conjugate (MenACWY) vaccine  You may need this if you have certain conditions. Hepatitis A vaccine  You may need this if you have certain conditions or if you travel or work in places where you may be exposed to hepatitis A. Hepatitis B vaccine  You may need this if you have certain conditions or if you travel or work in places where you may be exposed to hepatitis B. Haemophilus influenzae type b (Hib) vaccine  You may need this if you have certain conditions. You may receive vaccines as individual doses or as more than one vaccine together in one shot (combination vaccines). Talk with your health care provider about the risks and benefits of combination vaccines. What tests do I need? Blood tests  Lipid and cholesterol levels. These may be checked every 5 years, or more frequently depending on your overall health.  Hepatitis C test.  Hepatitis B test. Screening  Lung cancer screening. You may have this screening every year starting at age 20 if you have a 30-pack-year history of smoking and currently smoke or have quit within the past 15 years.  Colorectal cancer screening. All adults should have this screening starting at age 74 and continuing until age 16. Your health care provider may recommend screening at age 36 if you are at increased risk. You will have tests every 1-10 years, depending on your results and the type of screening test.  Prostate cancer screening. Recommendations will vary depending on your family history and other risks.  Diabetes screening. This is done by checking your blood sugar (glucose) after you have not eaten for a  while (fasting). You may have this done every 1-3 years.  Abdominal aortic aneurysm (AAA) screening. You may need this if you are a current or former smoker.  Sexually transmitted disease (STD) testing. Follow these instructions at home: Eating and drinking  Eat a diet that includes fresh fruits and vegetables, whole grains, lean  protein, and low-fat dairy products. Limit your intake of foods with high amounts of sugar, saturated fats, and salt.  Take vitamin and mineral supplements as recommended by your health care provider.  Do not drink alcohol if your health care provider tells you not to drink.  If you drink alcohol: ? Limit how much you have to 0-2 drinks a day. ? Be aware of how much alcohol is in your drink. In the [place] one drink equals one 12 oz bottle of beer (355 mL), one 5 oz glass of wine (148 mL), or one 1 oz glass of hard liquor (44 mL). Lifestyle  Take daily care of your teeth and gums.  Stay active. Exercise for at least 30 minutes on 5 or more days each week.  Do not use any products that contain nicotine or tobacco, such as cigarettes, e-cigarettes, and chewing tobacco. If you need help quitting, ask your health care provider.  If you are sexually active, practice safe sex. Use a condom or other form of protection to prevent STIs (sexually transmitted infections).  Talk with your health care provider about taking a low-dose aspirin or statin. What's next?  Visit your health care provider once a year for a well check visit.  Ask your health care provider how often you should have your eyes and teeth checked.  Stay up to date on all vaccines. This information is not intended to replace advice given to you by your health care provider. Make sure you discuss any questions you have with your health care provider. Document Revised: 09/27/2018 Document Reviewed: 09/27/2018 Elsevier Patient Education  [place].   Smoking Tobacco Information, Adult Smoking tobacco can be harmful to your health. Tobacco contains a poisonous (toxic), colorless chemical called nicotine. Nicotine is addictive. It changes the brain and can make it hard to stop smoking. Tobacco also has other toxic chemicals that can hurt your body and raise your risk of many cancers. How can smoking tobacco affect  me? Smoking tobacco puts you at risk for:  Cancer. Smoking is most commonly associated with lung cancer, but can also lead to cancer in other parts of the body.  Chronic obstructive pulmonary disease (COPD). This is a long-term lung condition that makes it hard to breathe. It also gets worse over time.  High blood pressure (hypertension), heart disease, stroke, or heart attack.  Lung infections, such as pneumonia.  Cataracts. This is when the lenses in the eyes become clouded.  Digestive problems. This may include peptic ulcers, heartburn, and gastroesophageal reflux disease (GERD).  Oral health problems, such as gum disease and tooth loss.  Loss of taste and smell. Smoking can affect your appearance by causing:  Wrinkles.  Yellow or stained teeth, fingers, and fingernails. Smoking tobacco can also affect your social life, because:  It may be challenging to find places to smoke when away from home. Many workplaces, [employer], hotels, and public places are tobacco-free.  Smoking is expensive. This is due to the cost of tobacco and the long-term costs of treating health problems from smoking.  Secondhand smoke may affect those around you. Secondhand smoke can cause lung cancer, breathing problems, and heart  disease. Children of smokers have a higher risk for: ? Sudden infant death syndrome (SIDS). ? Ear infections. ? Lung infections. If you currently smoke tobacco, quitting now can help you:  Lead a longer and healthier life.  Look, smell, breathe, and feel better over time.  Save money.  Protect others from the harms of secondhand smoke. What actions can I take to prevent health problems? Quit smoking   Do not start smoking. Quit if you already do.  Make a plan to quit smoking and commit to it. Look for programs to help you and ask your health care provider for recommendations and ideas.  Set a date and write down all the reasons you want to quit.  Let your  friends and family know you are quitting so they can help and support you. Consider finding friends who also want to quit. It can be easier to quit with someone else, so that you can support each other.  Talk with your health care provider about using nicotine replacement medicines to help you quit, such as gum, lozenges, patches, sprays, or pills.  Do not replace cigarette smoking with electronic cigarettes, which are commonly called e-cigarettes. The safety of e-cigarettes is not known, and some may contain harmful chemicals.  If you try to quit but return to smoking, stay positive. It is common to slip up when you first quit, so take it one day at a time.  Be prepared for cravings. When you feel the urge to smoke, chew gum or suck on hard candy. Lifestyle  Stay busy and take care of your body.  Drink enough fluid to keep your urine pale yellow.  Get plenty of exercise and eat a healthy diet. This can help prevent weight gain after quitting.  Monitor your eating habits. Quitting smoking can cause you to have a larger appetite than when you smoke.  Find ways to relax. Go out with friends or family to a movie or a restaurant where people do not smoke.  Ask your health care provider about having regular tests (screenings) to check for cancer. This may include blood tests, imaging tests, and other tests.  Find ways to manage your stress, such as meditation, yoga, or exercise. Where to find support To get support to quit smoking, consider:  Asking your health care provider for more information and resources.  Taking classes to learn more about quitting smoking.  Looking for local organizations that offer resources about quitting smoking.  Joining a support group for people who want to quit smoking in your local community.  Calling the smokefree.gov counselor helpline: 1-800-Quit-Now (330)295-3510) Where to find more information You may find more information about quitting smoking  from:  HelpGuide.org: www.helpguide.org  https://hall.com/: smokefree.gov  American Lung Association: www.lung.org Contact a health care provider if you:  Have problems breathing.  Notice that your lips, nose, or fingers turn blue.  Have chest pain.  Are coughing up blood.  Feel faint or you pass out.  Have other health changes that cause you to worry. Summary  Smoking tobacco can negatively affect your health, the health of those around you, your finances, and your social life.  Do not start smoking. Quit if you already do. If you need help quitting, ask your health care provider.  Think about joining a support group for people who want to quit smoking in your local community. There are many effective programs that will help you to quit this behavior. This information is not intended to replace advice  given to you by your health care provider. Make sure you discuss any questions you have with your health care provider. Document Revised: 06/28/2019 Document Reviewed: 10/18/2016 Elsevier Patient Education  2020 [place] Directive  Advance directives are legal documents that let you make choices ahead of time about your health care and medical treatment in case you become unable to communicate for yourself. Advance directives are a way for you to make known your wishes to family, friends, and health care providers. This can let others know about your end-of-life care if you become unable to communicate. Discussing and writing advance directives should happen over time rather than all at once. Advance directives can be changed depending on your situation and what you want, even after you have signed the advance directives. There are different types of advance directives, such as:  Medical power of attorney.  Living will.  Do not resuscitate (DNR) or do not attempt resuscitation (DNAR) order. Health care proxy and medical power of attorney A health care proxy is also  called a health care agent. This is a person who is appointed to make medical decisions for you in cases where you are unable to make the decisions yourself. Generally, people choose someone they know well and trust to represent their preferences. Make sure to ask this person for an agreement to act as your proxy. A proxy may have to exercise judgment in the event of a medical decision for which your wishes are not known. A medical power of attorney is a legal document that names your health care proxy. Depending on the laws in your state, after the document is written, it may also need to be:  Signed.  Notarized.  Dated.  Copied.  Witnessed.  Incorporated into your medical record. You may also want to appoint someone to manage your money in a situation in which you are unable to do so. This is called a durable power of attorney for finances. It is a separate legal document from the durable power of attorney for health care. You may choose the same person or someone different from your health care proxy to act as your agent in money matters. If you do not appoint a proxy, or if there is a concern that the proxy is not acting in your best interests, a court may appoint a guardian to act on your behalf. Living will A living will is a set of instructions that state your wishes about medical care when you cannot express them yourself. Health care providers should keep a copy of your living will in your medical record. You may want to give a copy to family members or friends. To alert caregivers in case of an emergency, you can place a card in your wallet to let them know that you have a living will and where they can find it. A living will is used if you become:  Terminally ill.  Disabled.  Unable to communicate or make decisions. Items to consider in your living will include:  To use or not to use life-support equipment, such as dialysis machines and breathing machines (ventilators).  A DNR  or DNAR order. This tells health care providers not to use cardiopulmonary resuscitation (CPR) if breathing or heartbeat stops.  To use or not to use tube feeding.  To be given or not to be given food and fluids.  Comfort (palliative) care when the goal becomes comfort rather than a cure.  Donation of organs and tissues. A  living will does not give instructions for distributing your money and property if you should pass away. DNR or DNAR A DNR or DNAR order is a request not to have CPR in the event that your heart stops beating or you stop breathing. If a DNR or DNAR order has not been made and shared, a health care provider will try to help any patient whose heart has stopped or who has stopped breathing. If you plan to have surgery, talk with your health care provider about how your DNR or DNAR order will be followed if problems occur. What if I do not have an advance directive? If you do not have an advance directive, some states assign family decision makers to act on your behalf based on how closely you are related to them. Each state has its own laws about advance directives. You may want to check with your health care provider, attorney, or state representative about the laws in your state. Summary  Advance directives are the legal documents that allow you to make choices ahead of time about your health care and medical treatment in case you become unable to tell others about your care.  The process of discussing and writing advance directives should happen over time. You can change the advance directives, even after you have signed them.  Advance directives include DNR or DNAR orders, living wills, and designating an agent as your medical power of attorney. This information is not intended to replace advice given to you by your health care provider. Make sure you discuss any questions you have with your health care provider. Document Revised: 05/02/2019 Document Reviewed:  05/02/2019 Elsevier Patient Education  [place].

## 2020-01-31 LAB — TSH: TSH: 1.88 u[IU]/mL (ref 0.450–4.500)

## 2020-01-31 LAB — LIPID PANEL
Chol/HDL Ratio: 3.5 ratio (ref 0.0–5.0)
Cholesterol, Total: 140 mg/dL (ref 100–199)
HDL: 40 mg/dL (ref >39)
LDL Chol Calc (NIH): 78 mg/dL (ref 0–99)
Triglycerides: 121 mg/dL (ref 0–149)
VLDL Cholesterol Cal: 22 mg/dL (ref 5–40)

## 2020-01-31 LAB — CARDIOVASCULAR RISK ASSESSMENT

## 2020-01-31 LAB — PSA: Prostate Specific Ag, Serum: 2.9 ng/mL (ref 0.0–4.0)

## 2020-02-09 DIAGNOSIS — H6122 Impacted cerumen, left ear: Secondary | ICD-10-CM | POA: Insufficient documentation

## 2020-02-09 DIAGNOSIS — F17219 Nicotine dependence, cigarettes, with unspecified nicotine-induced disorders: Secondary | ICD-10-CM | POA: Insufficient documentation

## 2020-02-09 DIAGNOSIS — F17200 Nicotine dependence, unspecified, uncomplicated: Secondary | ICD-10-CM | POA: Insufficient documentation

## 2020-02-09 HISTORY — DX: Impacted cerumen, left ear: H61.22

## 2020-03-11 DIAGNOSIS — M1A0490 Idiopathic chronic gout, unspecified hand, without tophus (tophi): Secondary | ICD-10-CM | POA: Diagnosis not present

## 2020-03-11 DIAGNOSIS — M0579 Rheumatoid arthritis with rheumatoid factor of multiple sites without organ or systems involvement: Secondary | ICD-10-CM | POA: Diagnosis not present

## 2020-03-11 DIAGNOSIS — Z79899 Other long term (current) drug therapy: Secondary | ICD-10-CM | POA: Diagnosis not present

## 2020-05-01 ENCOUNTER — Other Ambulatory Visit: Payer: Self-pay | Admitting: Family Medicine

## 2020-05-06 ENCOUNTER — Ambulatory Visit (INDEPENDENT_AMBULATORY_CARE_PROVIDER_SITE_OTHER): Payer: PPO | Admitting: Family Medicine

## 2020-05-06 ENCOUNTER — Other Ambulatory Visit: Payer: Self-pay

## 2020-05-06 ENCOUNTER — Encounter: Payer: Self-pay | Admitting: Family Medicine

## 2020-05-06 VITALS — BP 132/64 | HR 75 | Temp 97.5°F | Ht 68.0 in | Wt 161.0 lb

## 2020-05-06 DIAGNOSIS — R053 Chronic cough: Secondary | ICD-10-CM | POA: Insufficient documentation

## 2020-05-06 DIAGNOSIS — F17219 Nicotine dependence, cigarettes, with unspecified nicotine-induced disorders: Secondary | ICD-10-CM | POA: Diagnosis not present

## 2020-05-06 DIAGNOSIS — R05 Cough: Secondary | ICD-10-CM

## 2020-05-06 DIAGNOSIS — J449 Chronic obstructive pulmonary disease, unspecified: Secondary | ICD-10-CM | POA: Diagnosis not present

## 2020-05-06 DIAGNOSIS — J209 Acute bronchitis, unspecified: Secondary | ICD-10-CM

## 2020-05-06 DIAGNOSIS — J440 Chronic obstructive pulmonary disease with acute lower respiratory infection: Secondary | ICD-10-CM | POA: Diagnosis not present

## 2020-05-06 DIAGNOSIS — J439 Emphysema, unspecified: Secondary | ICD-10-CM | POA: Diagnosis not present

## 2020-05-06 DIAGNOSIS — R059 Cough, unspecified: Secondary | ICD-10-CM

## 2020-05-06 MED ORDER — AZITHROMYCIN 250 MG PO TABS: ORAL_TABLET | ORAL | 0 refills | Status: DC

## 2020-05-06 NOTE — Patient Instructions (Signed)
mucinex DM-to thin mucous and control cough Zithromax-pick up at the pharmacy Chest xray   Consider at CT lung for lung cancer screening

## 2020-05-06 NOTE — Progress Notes (Signed)
Acute Office Visit  Subjective:    Patient ID: Gerald Odom, male    DOB: 1951/10/02, 69 y.o.   MRN: [id number]  Chief Complaint  Patient presents with  . Cough    HPI Patient is in today for cough for past 4-5 weeks. Has been a smoker for 50+ years. Recently cut back on smoking (has not smoked any cigarettes in the past 3 days)  Cough is productive-clear mucous. Pt with last 4-5 weeks constant cough-mucous, no blood , green color.  No fever. No chest pain. Rheumatoid arthritis.  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 Pt took Chantix in the past.  1 pack/day. Occasionally less than 1 pk/day to 2 pk/day. Pt quit 6-7 years at one time.  . Pneumonia in the past 2-3 times.  Pt hospitialized for gallbaldder -got pneumonia. RA-take medication. Pt with COVID disease in [place].  No fever. No h/o PFT.   Past Medical History:  Diagnosis Date  . Arthritis    Rheumatoid arthritis  . Gout   . Hyperlipidemia   . Hypertension   . Pancreatitis 05/2016   hosp. [facility]  . Pneumonia 05/2016    Past Surgical History:  Procedure Laterality Date  . CHOLECYSTECTOMY    . CHOLECYSTECTOMY N/A 07/04/2016   Procedure: LAPAROSCOPIC CHOLECYSTECTOMY WITH INTRAOPERATIVE CHOLANGIOGRAM;  Surgeon: Jackolyn Confer, MD;  Location: [facility];  Service: General;  Laterality: N/A;  . ERCP N/A 05/16/2016   Procedure: ENDOSCOPIC RETROGRADE CHOLANGIOPANCREATOGRAPHY (ERCP);  Surgeon: Doran Stabler, MD;  Location: [facility];  Service: Endoscopy;  Laterality: N/A;  . KNEE SURGERY Left 1997    Family History  Problem Relation Age of Onset  . Anxiety disorder Father     Social History   Socioeconomic History  . Marital status: Married    Spouse name: Not on file  . Number of children: Not on file  . Years of education: Not on file  . Highest education level: Not on file  Occupational History  . Not on file  Tobacco Use  . Smoking status:  Current Every Day Smoker    Types: Cigarettes  . Smokeless tobacco: Former Network engineer and Sexual Activity  . Alcohol use: No  . Drug use: No  . Sexual activity: Not on file  Other Topics Concern  . Not on file  Social History Narrative  . Not on file   Social Determinants of Health   Financial Resource Strain:   . Difficulty of Paying Living Expenses:   Food Insecurity:   . Worried About Charity fundraiser in the Last Year:   . Arboriculturist in the Last Year:   Transportation Needs:   . Film/video editor (Medical):   Marland Kitchen Lack of Transportation (Non-Medical):   Physical Activity:   . Days of Exercise per Week:   . Minutes of Exercise per Session:   Stress:   . Feeling of Stress :   Social Connections:   . Frequency of Communication with Friends and Family:   . Frequency of Social Gatherings with Friends and Family:   . Attends Religious Services:   . Active Member of Clubs or Organizations:   . Attends Archivist Meetings:   Marland Kitchen Marital Status:   Intimate Partner Violence:   . Fear of Current or Ex-Partner:   . Emotionally Abused:   Marland Kitchen Physically Abused:   . Sexually Abused:     Outpatient Medications Prior to Visit  Medication Sig Dispense Refill  . allopurinol (ZYLOPRIM) 300 MG tablet Take 300 mg by mouth daily before breakfast.     . celecoxib (CELEBREX) 100 MG capsule Take 200 mg by mouth.     . Etanercept (ENBREL) 25 MG/0.5ML SOSY Enbrel 25 mg/0.5 mL (0.5 mL) subcutaneous syringe    . folic acid (FOLVITE) 1 MG tablet Take by mouth.    Marland Kitchen lisinopril (ZESTRIL) 5 MG tablet TAKE ONE (1) TABLET BY MOUTH ONCE DAILY 90 tablet 1  . methotrexate (RHEUMATREX) 2.5 MG tablet Take 10 mg by mouth once a week.     No facility-administered medications prior to visit.    No Known Allergies  Review of Systems  Constitutional: Negative for chills and fever.  Respiratory: Positive for cough and shortness of breath.   Cardiovascular: Negative for chest pain.    Neurological: Negative for dizziness and headaches.       Objective:    Physical Exam Constitutional:      Appearance: Normal appearance.  HENT:     Head: Normocephalic.  Cardiovascular:     Rate and Rhythm: Normal rate and regular rhythm.     Pulses: Normal pulses.     Heart sounds: Normal heart sounds.  Musculoskeletal:        General: Normal range of motion.     Cervical back: Normal range of motion and neck supple.  Neurological:     Mental Status: He is alert and oriented to person, place, and time.  Psychiatric:        Behavior: Behavior normal.     BP 132/64   Pulse 75   Temp (!) 97.5 F (36.4 C)   Ht 5\' 8"  (1.727 m)   Wt 161 lb (73 kg)   SpO2 95%   BMI 24.48 kg/m  Wt Readings from Last 3 Encounters:  05/06/20 161 lb (73 kg)  01/30/20 162 lb (73.5 kg)  12/13/19 166 lb 3.2 oz (75.4 kg)    Health Maintenance Due  Topic Date Due  . Hepatitis C Screening  Never done  . COVID-19 Vaccine (1) Never done  . PNA vac Low Risk Adult (2 of 2 - PPSV23) 09/17/2018    Lab Results  Component Value Date   TSH 1.880 01/30/2020   Lab Results  Component Value Date   WBC 18.4 (H) 07/05/2016   HGB 14.2 07/05/2016   HCT 42.0 07/05/2016   MCV 94.4 07/05/2016   PLT 243 07/05/2016   Lab Results  Component Value Date   NA 140 07/05/2016   K 3.8 07/05/2016   CO2 24 07/05/2016   GLUCOSE 123 (H) 07/05/2016   BUN 7 07/05/2016   CREATININE 0.87 07/05/2016   BILITOT 0.8 07/05/2016   ALKPHOS 69 07/05/2016   AST 39 07/05/2016   ALT 49 07/05/2016   PROT 6.8 07/05/2016   ALBUMIN 3.7 07/05/2016   CALCIUM 9.3 07/05/2016   ANIONGAP 9 07/05/2016   Lab Results  Component Value Date   CHOL 140 01/30/2020   Lab Results  Component Value Date   HDL 40 01/30/2020   Lab Results  Component Value Date   LDLCALC 78 01/30/2020   Lab Results  Component Value Date   TRIG 121 01/30/2020   Lab Results  Component Value Date   CHOLHDL 3.5 01/30/2020      Assessment &  Plan:    1. Cough Noted for several weeks-pt with concern for infection - DG Chest 2 View; Future  2. Cigarette nicotine dependence with  nicotine-induced disorder Consider CT lung for evaluation-screening  3. Acute bronchitis with COPD (HCC)-zithromax-rx-risk/benefit/side effects-mucinex DM  Follow-up: An After Visit Summary was printed and given to the patient.  [place] [phone]

## 2020-05-18 ENCOUNTER — Other Ambulatory Visit: Payer: Self-pay

## 2020-05-18 ENCOUNTER — Telehealth: Payer: Self-pay

## 2020-05-18 MED ORDER — LISINOPRIL 5 MG PO TABS: 5.0000 mg | ORAL_TABLET | Freq: Two times a day (BID) | ORAL | 0 refills | Status: DC

## 2020-05-18 NOTE — Telephone Encounter (Signed)
Gerald Odom called to report that his refill on his lisinopril was sent in incorrectly.  He has been taking 5 mg twice daily.  Medication resent to pharmacy.

## 2020-07-15 DIAGNOSIS — M7661 Achilles tendinitis, right leg: Secondary | ICD-10-CM | POA: Diagnosis not present

## 2020-07-15 DIAGNOSIS — M1A09X0 Idiopathic chronic gout, multiple sites, without tophus (tophi): Secondary | ICD-10-CM | POA: Diagnosis not present

## 2020-07-15 DIAGNOSIS — Z79899 Other long term (current) drug therapy: Secondary | ICD-10-CM | POA: Diagnosis not present

## 2020-07-15 DIAGNOSIS — M0579 Rheumatoid arthritis with rheumatoid factor of multiple sites without organ or systems involvement: Secondary | ICD-10-CM | POA: Diagnosis not present

## 2020-07-31 ENCOUNTER — Encounter: Payer: Self-pay | Admitting: Family Medicine

## 2020-07-31 ENCOUNTER — Other Ambulatory Visit: Payer: Self-pay

## 2020-07-31 ENCOUNTER — Ambulatory Visit (INDEPENDENT_AMBULATORY_CARE_PROVIDER_SITE_OTHER): Payer: PPO | Admitting: Family Medicine

## 2020-07-31 VITALS — BP 130/86 | HR 72 | Temp 97.4°F | Resp 16 | Ht 68.0 in | Wt 167.4 lb

## 2020-07-31 DIAGNOSIS — Z23 Encounter for immunization: Secondary | ICD-10-CM | POA: Diagnosis not present

## 2020-07-31 DIAGNOSIS — M0579 Rheumatoid arthritis with rheumatoid factor of multiple sites without organ or systems involvement: Secondary | ICD-10-CM

## 2020-07-31 DIAGNOSIS — I10 Essential (primary) hypertension: Secondary | ICD-10-CM

## 2020-07-31 DIAGNOSIS — M1A09X0 Idiopathic chronic gout, multiple sites, without tophus (tophi): Secondary | ICD-10-CM | POA: Diagnosis not present

## 2020-07-31 DIAGNOSIS — J418 Mixed simple and mucopurulent chronic bronchitis: Secondary | ICD-10-CM

## 2020-07-31 DIAGNOSIS — Z122 Encounter for screening for malignant neoplasm of respiratory organs: Secondary | ICD-10-CM | POA: Diagnosis not present

## 2020-07-31 MED ORDER — BREZTRI AEROSPHERE 160-9-4.8 MCG/ACT IN AERO: 2.0000 | INHALATION_SPRAY | Freq: Two times a day (BID) | RESPIRATORY_TRACT | 5 refills | Status: DC

## 2020-07-31 NOTE — Progress Notes (Signed)
Subjective:  Patient ID: Gerald Odom, male    DOB: 10-09-1951  Age: 69 y.o. MRN: [id number]  Chief Complaint  Patient presents with   Hypertension    HPI Hypertension: Currently on lisinopril 5 mg one twice a day. BP is poorly controlled today. BP 160/90. Recheck BP is 130/86.  Gout and RA: Currently on MTX, celebrex, folic acid, enbrel, and allopurinol. [facility].  Tobacco Use: Pt has smoked for years. 1 PPD. 45  Pack years. Patient does not wish to quit. No dx of COPD, but pt has Dypsnea on exertion.    Current Outpatient Medications on File Prior to Visit  Medication Sig Dispense Refill   allopurinol (ZYLOPRIM) 300 MG tablet Take 300 mg by mouth daily before breakfast.      celecoxib (CELEBREX) 100 MG capsule Take 200 mg by mouth.      etanercept (ENBREL) 50 MG/ML injection      folic acid (FOLVITE) 1 MG tablet Take by mouth.     lisinopril (ZESTRIL) 5 MG tablet Take 1 tablet (5 mg total) by mouth 2 (two) times daily. 60 tablet 0   methotrexate (RHEUMATREX) 2.5 MG tablet Take 10 mg by mouth once a week.     No current facility-administered medications on file prior to visit.   Past Medical History:  Diagnosis Date   Acute cholangitis 05/14/2016   Arthritis    Rheumatoid arthritis   Choledocholithiasis 05/16/2016   Gallstone pancreatitis 05/14/2016   Gout    Hearing loss of left ear due to cerumen impaction 02/09/2020   Hyperlipidemia    Hypertension    Pancreatitis 05/2016   [facility]osp. [facility]   Pneumonia 05/2016   Past Surgical History:  Procedure Laterality Date   CHOLECYSTECTOMY     CHOLECYSTECTOMY N/A 07/04/2016   Procedure: LAPAROSCOPIC CHOLECYSTECTOMY WITH INTRAOPERATIVE CHOLANGIOGRAM;  Surgeon: Jackolyn Confer, MD;  Location: [place];  Service: General;  Laterality: N/A;   ERCP N/A 05/16/2016   Procedure: ENDOSCOPIC RETROGRADE CHOLANGIOPANCREATOGRAPHY (ERCP);  Surgeon: Doran Stabler, MD;  Location: [facility];  Service:  Endoscopy;  Laterality: N/A;   KNEE SURGERY Left 1997    Family History  Problem Relation Age of Onset   Anxiety disorder Father    Social History   Socioeconomic History   Marital status: Married    Spouse name: Not on file   Number of children: Not on file   Years of education: Not on file   Highest education level: Not on file  Occupational History   Not on file  Tobacco Use   Smoking status: Current Every Day Smoker    Packs/day: 1.00    Years: 45.00    Pack years: 45.00    Types: Cigarettes   Smokeless tobacco: Former Network engineer and Sexual Activity   Alcohol use: No   Drug use: No   Sexual activity: Not on file  Other Topics Concern   Not on file  Social History Narrative   Not on file   Social Determinants of Health   Financial Resource Strain:    Difficulty of Paying Living Expenses: Not on file  Food Insecurity:    Worried About Charity fundraiser in the Last Year: Not on file   [employer] of Food in the Last Year: Not on file  Transportation Needs:    Lack of Transportation (Medical): Not on file   Lack of Transportation (Non-Medical): Not on file  Physical Activity:    Days of Exercise  per Week: Not on file   Minutes of Exercise per Session: Not on file  Stress:    Feeling of Stress : Not on file  Social Connections:    Frequency of Communication with Friends and Family: Not on file   Frequency of Social Gatherings with Friends and Family: Not on file   Attends Religious Services: Not on file   Active Member of Clubs or Organizations: Not on file   Attends Archivist Meetings: Not on file   Marital Status: Not on file    Review of Systems  Constitutional: Negative for chills, fatigue and fever.  HENT: Positive for rhinorrhea. Negative for congestion, ear pain and sore throat.   Respiratory: Positive for shortness of breath. Negative for cough.   Cardiovascular: Negative for chest pain.  Gastrointestinal:  Negative for abdominal pain, constipation, diarrhea, nausea and vomiting.  Endocrine: Negative for polydipsia, polyphagia and polyuria.  Genitourinary: Negative for dysuria and frequency.  Musculoskeletal: Positive for arthralgias (rt foot pain flare up. Gave a prednisone burst. ). Negative for myalgias.  Neurological: Negative for dizziness and headaches.  Psychiatric/Behavioral: Negative for dysphoric mood.       No dysphoria     Objective:  BP 130/86    Pulse 72    Temp (!) 97.4 F (36.3 C)    Resp 16    Ht 5\' 8"  (1.727 m)    Wt 167 lb 6.4 oz (75.9 kg)    BMI 25.45 kg/m   BP/Weight 07/31/2020 05/06/2020 1/61/0960  Systolic BP 454 098 119  Diastolic BP 86 64 78  Wt. (Lbs) 167.4 161 162  BMI 25.45 24.48 25.37    Physical Exam Vitals reviewed.  Constitutional:      Appearance: Normal appearance.  Neck:     Vascular: No carotid bruit.  Cardiovascular:     Rate and Rhythm: Normal rate and regular rhythm.     Heart sounds: Normal heart sounds.  Pulmonary:     Effort: Pulmonary effort is normal.     Breath sounds: Wheezing (diffuse) present. No rhonchi or rales.  Abdominal:     General: Bowel sounds are normal.     Palpations: Abdomen is soft.     Tenderness: There is no abdominal tenderness.  Neurological:     Mental Status: He is alert.  Psychiatric:        Mood and Affect: Mood normal.        Behavior: Behavior normal.     Diabetic Foot Exam - Simple   No data filed       Lab Results  Component Value Date   WBC 18.4 (H) 07/05/2016   HGB 14.2 07/05/2016   HCT 42.0 07/05/2016   PLT 243 07/05/2016   GLUCOSE 123 (H) 07/05/2016   CHOL 140 01/30/2020   TRIG 121 01/30/2020   HDL 40 01/30/2020   LDLCALC 78 01/30/2020   ALT 49 07/05/2016   AST 39 07/05/2016   NA 140 07/05/2016   K 3.8 07/05/2016   CL 107 07/05/2016   CREATININE 0.87 07/05/2016   BUN 7 07/05/2016   CO2 24 07/05/2016   TSH 1.880 01/30/2020   INR 0.97 05/14/2016      Assessment & Plan:    1. Essential hypertension, benign The current medical regimen is effective;  continue present plan and medications.  2. Mixed simple and mucopurulent chronic bronchitis ([place]) Start breztri 2 puffs twice a day.  Peak Flows abnormal. (200, 250, 250)  3. Encounter for screening for  lung cancer - CT CHEST LUNG CA SCREEN LOW DOSE W/O CM  4. Need for immunization against influenza - Flu Vaccine QUAD High Dose(Fluad)  5. Rheumatoid arthritis involving multiple sites with positive rheumatoid factor (HCC) The current medical regimen is effective;  continue present plan and medications. Labs reviewed from rheumatology.  6. Idiopathic chronic gout of multiple sites without tophus  The current medical regimen is effective;  continue present plan and medications.  Meds ordered this encounter  Medications   DISCONTD: Budeson-Glycopyrrol-Formoterol (BREZTRI AEROSPHERE) 160-9-4.8 MCG/ACT AERO    Sig: Inhale 2 puffs into the lungs 2 (two) times daily.    Dispense:  10.7 g    Refill:  5    Orders Placed This Encounter  Procedures   CT CHEST LUNG CA SCREEN LOW DOSE W/O CM   Flu Vaccine QUAD High Dose(Fluad)     Follow-up: Return in about 4 months (around 12/01/2020).  An After Visit Summary was printed and given to the patient.  [name] [facility] [phone]

## 2020-07-31 NOTE — Progress Notes (Signed)
Re-sent via fax since e-scribe is down currently.

## 2020-08-04 ENCOUNTER — Other Ambulatory Visit: Payer: Self-pay | Admitting: Family Medicine

## 2020-08-04 DIAGNOSIS — Z122 Encounter for screening for malignant neoplasm of respiratory organs: Secondary | ICD-10-CM

## 2020-08-14 ENCOUNTER — Encounter: Payer: Self-pay | Admitting: Family Medicine

## 2020-08-14 DIAGNOSIS — F17210 Nicotine dependence, cigarettes, uncomplicated: Secondary | ICD-10-CM | POA: Diagnosis not present

## 2020-08-14 DIAGNOSIS — Z87891 Personal history of nicotine dependence: Secondary | ICD-10-CM | POA: Diagnosis not present

## 2020-08-17 ENCOUNTER — Other Ambulatory Visit: Payer: Self-pay

## 2020-08-17 MED ORDER — ALLOPURINOL 300 MG PO TABS: 300.0000 mg | ORAL_TABLET | Freq: Every day | ORAL | 1 refills | Status: DC

## 2020-08-18 ENCOUNTER — Other Ambulatory Visit: Payer: Self-pay

## 2020-08-18 MED ORDER — ALLOPURINOL 300 MG PO TABS: 300.0000 mg | ORAL_TABLET | Freq: Every day | ORAL | 1 refills | Status: DC

## 2020-08-23 DIAGNOSIS — J069 Acute upper respiratory infection, unspecified: Secondary | ICD-10-CM | POA: Diagnosis not present

## 2020-08-24 ENCOUNTER — Other Ambulatory Visit: Payer: Self-pay

## 2020-08-24 MED ORDER — LISINOPRIL 5 MG PO TABS: 5.0000 mg | ORAL_TABLET | Freq: Two times a day (BID) | ORAL | 2 refills | Status: DC

## 2020-08-26 ENCOUNTER — Telehealth: Payer: Self-pay | Admitting: Family Medicine

## 2020-08-26 NOTE — Chronic Care Management (AMB) (Signed)
°  Chronic Care Management   Note  08/26/2020 Name: JOHNWILLIAM SHEPPERSON MRN: [id number] DOB: 1951/05/31  Pierce Crane Corriher is a 69 y.o. year old male who is a primary care patient of Cox, Kirsten, MD. I reached out to Ulanda Edison by phone today in response to a referral sent by Mr. Moris Ratchford Chaisson's PCP, Cox, Elnita Maxwell, MD.   Mr. Hillier was given information about Chronic Care Management services today including:  1. CCM service includes personalized support from designated clinical staff supervised by his physician, including individualized plan of care and coordination with other care providers 2. 24/7 contact phone numbers for assistance for urgent and routine care needs. 3. Service will only be billed when office clinical staff spend 20 minutes or more in a month to coordinate care. 4. Only one practitioner may furnish and bill the service in a calendar month. 5. The patient may stop CCM services at any time (effective at the end of the month) by phone call to the office staff.   Patient agreed to services and verbal consent obtained.   Follow up plan:   [place]

## 2020-08-27 ENCOUNTER — Encounter: Payer: Self-pay | Admitting: Family Medicine

## 2020-08-27 ENCOUNTER — Ambulatory Visit (INDEPENDENT_AMBULATORY_CARE_PROVIDER_SITE_OTHER): Payer: PPO | Admitting: Family Medicine

## 2020-08-27 VITALS — BP 128/80 | HR 73 | Temp 98.2°F

## 2020-08-27 DIAGNOSIS — J302 Other seasonal allergic rhinitis: Secondary | ICD-10-CM | POA: Diagnosis not present

## 2020-08-27 DIAGNOSIS — J018 Other acute sinusitis: Secondary | ICD-10-CM | POA: Diagnosis not present

## 2020-08-27 DIAGNOSIS — G4700 Insomnia, unspecified: Secondary | ICD-10-CM | POA: Diagnosis not present

## 2020-08-27 DIAGNOSIS — R059 Cough, unspecified: Secondary | ICD-10-CM | POA: Diagnosis not present

## 2020-08-27 LAB — POC COVID19 BINAXNOW: SARS Coronavirus 2 Ag: NEGATIVE

## 2020-08-27 MED ORDER — TRAZODONE HCL 50 MG PO TABS: 50.0000 mg | ORAL_TABLET | Freq: Every day | ORAL | 2 refills | Status: DC

## 2020-08-27 MED ORDER — AMOXICILLIN 875 MG PO TABS: 875.0000 mg | ORAL_TABLET | Freq: Two times a day (BID) | ORAL | 0 refills | Status: DC

## 2020-08-27 NOTE — Progress Notes (Signed)
Acute Office Visit  Subjective:    Patient ID: Gerald Odom, male    DOB: 11-16-1950, 69 y.o.   MRN: [id number]  Chief Complaint  Patient presents with  . Cough    HPI Patient is in today for persistent cough for 2 weeks.  Patient was seen in urgent care on Sunday and was given a Kenalog shot and Tessalon Perles.  He continues to have a cough, congestion, and runny nose.  He denies fever, chills, sweats, sore throat, earaches.  Denies muscle pain.  Has had no known exposure to COVID-19.  Has been vaccinated x2 for COVID-19.  Insomnia- c/o of difficulty sleeping for the past 9 months. Does not have have issues going to sleep, complains that he can not stay asleep. Tylenol PM does not help.   Past Medical History:  Diagnosis Date  . Acute cholangitis 05/14/2016  . Arthritis    Rheumatoid arthritis  . Choledocholithiasis 05/16/2016  . Gallstone pancreatitis 05/14/2016  . Gout   . Hearing loss of left ear due to cerumen impaction 02/09/2020  . Hyperlipidemia   . Hypertension   . Pancreatitis 05/2016   [facility]osp[facility]  . Pneumonia 05/2016    Past Surgical History:  Procedure Laterality Date  . CHOLECYSTECTOMY    . CHOLECYSTECTOMY N/A 07/04/2016   Procedure: LAPAROSCOPIC CHOLECYSTECTOMY WITH INTRAOPERATIVE CHOLANGIOGRAM;  Surgeon: Jackolyn Confer, MD;  Location: [place];  Service: General;  Laterality: N/A;  . ERCP N/A 05/16/2016   Procedure: ENDOSCOPIC RETROGRADE CHOLANGIOPANCREATOGRAPHY (ERCP);  Surgeon: Doran Stabler, MD;  Location: [facility];  Service: Endoscopy;  Laterality: N/A;  . KNEE SURGERY Left 1997    Family History  Problem Relation Age of Onset  . Anxiety disorder Father     Social History   Socioeconomic History  . Marital status: Married    Spouse name: Not on file  . Number of children: Not on file  . Years of education: Not on file  . Highest education level: Not on file  Occupational History  . Not on file  Tobacco Use  . Smoking status: Current  Every Day Smoker    Packs/day: 1.00    Years: 45.00    Pack years: 45.00    Types: Cigarettes  . Smokeless tobacco: Former Network engineer and Sexual Activity  . Alcohol use: No  . Drug use: No  . Sexual activity: Not on file  Other Topics Concern  . Not on file  Social History Narrative  . Not on file   Social Determinants of Health   Financial Resource Strain:   . Difficulty of Paying Living Expenses: Not on file  Food Insecurity:   . Worried About Charity fundraiser in the Last Year: Not on file  . Ran Out of Food in the Last Year: Not on file  Transportation Needs:   . Lack of Transportation (Medical): Not on file  . Lack of Transportation (Non-Medical): Not on file  Physical Activity:   . Days of Exercise per Week: Not on file  . Minutes of Exercise per Session: Not on file  Stress:   . Feeling of Stress : Not on file  Social Connections:   . Frequency of Communication with Friends and Family: Not on file  . Frequency of Social Gatherings with Friends and Family: Not on file  . Attends Religious Services: Not on file  . Active Member of Clubs or Organizations: Not on file  . Attends Archivist Meetings: Not  on file  . Marital Status: Not on file  Intimate Partner Violence:   . Fear of Current or Ex-Partner: Not on file  . Emotionally Abused: Not on file  . Physically Abused: Not on file  . Sexually Abused: Not on file    Outpatient Medications Prior to Visit  Medication Sig Dispense Refill  . allopurinol (ZYLOPRIM) 300 MG tablet Take 1 tablet (300 mg total) by mouth daily before breakfast. 90 tablet 1  . benzonatate (TESSALON) 200 MG capsule Take by mouth.    . Budeson-Glycopyrrol-Formoterol (BREZTRI AEROSPHERE) 160-9-4.8 MCG/ACT AERO Inhale 2 puffs into the lungs 2 (two) times daily. 10.7 g 5  . celecoxib (CELEBREX) 100 MG capsule Take 200 mg by mouth.     . etanercept (ENBREL) 50 MG/ML injection     . folic acid (FOLVITE) 1 MG tablet Take by mouth.     Marland Kitchen lisinopril (ZESTRIL) 5 MG tablet Take 1 tablet (5 mg total) by mouth 2 (two) times daily. 60 tablet 2  . methotrexate (RHEUMATREX) 2.5 MG tablet Take 10 mg by mouth once a week.     No facility-administered medications prior to visit.    No Known Allergies  Review of Systems  Constitutional: Negative for chills and fever.  HENT: Positive for congestion and rhinorrhea. Negative for ear pain and sore throat.   Respiratory: Positive for cough. Negative for shortness of breath.   Cardiovascular: Negative for chest pain.  Gastrointestinal: Negative for abdominal pain, constipation, nausea and vomiting.  Musculoskeletal: Negative for myalgias.  Neurological: Negative for headaches.       Objective:    Physical Exam Constitutional:      Appearance: Normal appearance.  HENT:     Right Ear: Tympanic membrane, ear canal and external ear normal.     Left Ear: Tympanic membrane, ear canal and external ear normal.     Nose: Mucosal edema present. No nasal tenderness, congestion or rhinorrhea.     Right Turbinates: Pale.     Left Turbinates: Pale.     Mouth/Throat:     Mouth: Mucous membranes are moist.     Pharynx: No oropharyngeal exudate or posterior oropharyngeal erythema.  Cardiovascular:     Rate and Rhythm: Normal rate and regular rhythm.     Heart sounds: Normal heart sounds.  Pulmonary:     Effort: Pulmonary effort is normal. No respiratory distress.     Breath sounds: Normal breath sounds. No wheezing, rhonchi or rales.  Abdominal:     General: Bowel sounds are normal.     Palpations: Abdomen is soft.     Tenderness: There is no abdominal tenderness.  Lymphadenopathy:     Cervical: No cervical adenopathy.  Neurological:     Mental Status: He is alert.  Psychiatric:        Mood and Affect: Mood normal.        Behavior: Behavior normal.     BP 128/80 (BP Location: Right Arm, Patient Position: Sitting, Cuff Size: Normal)   Pulse 73   Temp 98.2 F (36.8 C)  (Temporal)   SpO2 95%  Wt Readings from Last 3 Encounters:  07/31/20 167 lb 6.4 oz (75.9 kg)  05/06/20 161 lb (73 kg)  01/30/20 162 lb (73.5 kg)    Health Maintenance Due  Topic Date Due  . Hepatitis C Screening  Never done  . PNA vac Low Risk Adult (2 of 2 - PPSV23) 09/17/2018    There are no preventive care reminders to display for  this patient.   Lab Results  Component Value Date   TSH 1.880 01/30/2020   Lab Results  Component Value Date   WBC 18.4 (H) 07/05/2016   HGB 14.2 07/05/2016   HCT 42.0 07/05/2016   MCV 94.4 07/05/2016   PLT 243 07/05/2016   Lab Results  Component Value Date   NA 140 07/05/2016   K 3.8 07/05/2016   CO2 24 07/05/2016   GLUCOSE 123 (H) 07/05/2016   BUN 7 07/05/2016   CREATININE 0.87 07/05/2016   BILITOT 0.8 07/05/2016   ALKPHOS 69 07/05/2016   AST 39 07/05/2016   ALT 49 07/05/2016   PROT 6.8 07/05/2016   ALBUMIN 3.7 07/05/2016   CALCIUM 9.3 07/05/2016   ANIONGAP 9 07/05/2016   Lab Results  Component Value Date   CHOL 140 01/30/2020   Lab Results  Component Value Date   HDL 40 01/30/2020   Lab Results  Component Value Date   LDLCALC 78 01/30/2020   Lab Results  Component Value Date   TRIG 121 01/30/2020   Lab Results  Component Value Date   CHOLHDL 3.5 01/30/2020   No results found for: HGBA1C     Assessment & Plan:  1. Cough - POC COVID-19 BinaxNow negative. - mucinex dm given.   2. Seasonal allergic rhinitis, unspecified trigger Samples of Zyrtec and Mucinex DM given to patient  3. Other acute sinusitis.  Start if symptoms do not respond to zyrtec.   Amoxicillin 875 mg 1 tablet two times daily for 10 days  4. Insomnia Trazodone 50 mg 1 tablet at bedtime Call if not imroving.  My nursing staff have aided in the documentation of this note on the behalf of [name], MD,as directed by  [name], MD and thoroughly reviewed by [name], MD.    Follow-up: Return if symptoms worsen or fail to  improve.  An After Visit Summary was printed and given to the patient.  [name], MD [facility] [phone]

## 2020-10-05 ENCOUNTER — Telehealth: Payer: Self-pay

## 2020-10-05 NOTE — Chronic Care Management (AMB) (Signed)
    Chronic Care Management Pharmacy Assistant   Name: Gerald Odom  MRN: [id number] DOB: Jun 23, 1951  Reason for Encounter: Medication Review / Initial Questions for Pharmacist visit on 10/13/2020.  Have you seen any other providers since your last visit? Yes 07/31/2020 - Gerald Cox,Md(PCP)  Hypertension, Chronic Bronchitis - Return Follow up in four months . 08/27/2020 Gerald Odom. MD( PCP)Cough   Any changes in your medications or health? Yes Started Breztri 2 puffs twice a day- on 07-31-2020 by Gerald Maxwell Cox,MD  08/27/2020 Gerald Odom. MD - Samples of Zyrtec and Mucinex / Stared on Amoxicillin 875 One tablet two times a day for 10 days .  Patient also is seen by Rheumatology - Dr Gerald Odom.   Any side effects from any medications? No   Do you have an symptoms or problems not managed by your medications? Patient states he thinks all medications are working.  Any concerns about your health right now? Patient states he is okay.   Has your provider asked that you check blood pressure, blood sugar, or follow special diet at home? Patient states he has not been taking his blood pressure or blood sugars at home. He does watch diet.  Do you get any type of exercise on a regular basis? Yes, Patient states he is always working or busy, he stay out doors a lot.  Can you think of a goal you would like to reach for your health? Yes . " I would like to live a long life".  Do you have any problems getting your medications? No, Patient is getting patient assistance for his Enbrel Injections.  Is there anything that you would like to discuss during the appointment? No,  Patient is aware to have all  medications and supplements available at time of appointment.    PCP : Gerald Brome, MD  Allergies:  No Known Allergies  Medications: Outpatient Encounter Medications as of 10/05/2020  Medication Sig  . allopurinol (ZYLOPRIM) 300 MG tablet Take 1 tablet (300 mg total) by mouth daily before  breakfast.  . amoxicillin (AMOXIL) 875 MG tablet Take 1 tablet (875 mg total) by mouth 2 (two) times daily.  . benzonatate (TESSALON) 200 MG capsule Take by mouth.  . Budeson-Glycopyrrol-Formoterol (BREZTRI AEROSPHERE) 160-9-4.8 MCG/ACT AERO Inhale 2 puffs into the lungs 2 (two) times daily.  . celecoxib (CELEBREX) 100 MG capsule Take 200 mg by mouth.   . etanercept (ENBREL) 50 MG/ML injection   . folic acid (FOLVITE) 1 MG tablet Take by mouth.  Marland Kitchen lisinopril (ZESTRIL) 5 MG tablet Take 1 tablet (5 mg total) by mouth 2 (two) times daily.  . methotrexate (RHEUMATREX) 2.5 MG tablet Take 10 mg by mouth once a week.  . traZODone (DESYREL) 50 MG tablet Take 1 tablet (50 mg total) by mouth at bedtime.   No facility-administered encounter medications on file as of 10/05/2020.    Current Diagnosis: Patient Active Problem List   Diagnosis Date Noted  . Cigarette nicotine dependence with nicotine-induced disorder 02/09/2020  . Prostate cancer screening 01/30/2020  . Essential hypertension, benign 01/30/2020  . Idiopathic chronic gout of hand without tophus 05/10/2017  . Rheumatoid arthritis involving multiple sites with positive rheumatoid factor ([place]) 05/10/2017  . Gout 05/14/2016  . GERD (gastroesophageal reflux disease) 05/14/2016  . Tobacco abuse 07/19/2011      Follow-Up:  Pharmacist Review   Gerald Odom. Owens Shark, [place] Notified  Judithann Sheen, [facility] Clinical Pharmacist Assistant [phone]

## 2020-10-06 NOTE — Chronic Care Management (AMB) (Deleted)
Chronic Care Management Pharmacy  Name: Gerald Odom  MRN: [id number] DOB: [birth year]  Chief Complaint/ HPI  Ulanda Edison,  69 y.o. , male presents for their Initial CCM visit with the clinical pharmacist {CHL HP Upstream Pharm visit [id number].  PCP : [name], MD  Their chronic conditions include: hypertension, GERD, chronic gout, rheumatoid arthritis, tobacco abuse.   Office Visits:*** 08/27/2020 - mucinex DM and zyrtec samples given. Amoxicillin 875 mg bid x10 days. Prescribed trazodone 50 mg at bedtime for insomina.  07/31/2020 - Start Breztri 2 puffs twice daily. Flu shot in office.  05/06/2020 - ordered chest x-ray for cough. Consider CT scan for lung evaluation-screening. Acute bronchitis zithromax and mucinex DM.  Consult Visit:*** 07/15/2020 - rheumatology - *** continue Celebrex, Enbrel SQ, increase methotrexate 15 mg weekly and Folic acid. Take prednisone prn.  Medications: Outpatient Encounter Medications as of 10/13/2020  Medication Sig  . allopurinol (ZYLOPRIM) 300 MG tablet Take 1 tablet (300 mg total) by mouth daily before breakfast.  . amoxicillin (AMOXIL) 875 MG tablet Take 1 tablet (875 mg total) by mouth 2 (two) times daily.  . benzonatate (TESSALON) 200 MG capsule Take by mouth.  . Budeson-Glycopyrrol-Formoterol (BREZTRI AEROSPHERE) 160-9-4.8 MCG/ACT AERO Inhale 2 puffs into the lungs 2 (two) times daily.  . celecoxib (CELEBREX) 100 MG capsule Take 200 mg by mouth.   . etanercept (ENBREL) 50 MG/ML injection   . folic acid (FOLVITE) 1 MG tablet Take by mouth.  Marland Kitchen lisinopril (ZESTRIL) 5 MG tablet Take 1 tablet (5 mg total) by mouth 2 (two) times daily.  . methotrexate (RHEUMATREX) 2.5 MG tablet Take 10 mg by mouth once a week.  . traZODone (DESYREL) 50 MG tablet Take 1 tablet (50 mg total) by mouth at bedtime.   No facility-administered encounter medications on file as of 10/13/2020.   No Known Allergies  SDOH Screenings   Alcohol Screen: Low  Risk   . Last Alcohol Screening Score (AUDIT): 1  Depression (PHQ2-9): Low Risk   . PHQ-2 Score: 0  Financial Resource Strain: Not on file  Food Insecurity: Not on file  Housing: Not on file  Physical Activity: Not on file  Social Connections: Not on file  Stress: Not on file  Tobacco Use: High Risk  . Smoking Tobacco Use: Current Every Day Smoker  . Smokeless Tobacco Use: Former Soil scientist Needs: Not on file     Current Diagnosis/Assessment:  Goals Addressed   None     Hypertension   BP today is:  {CHL HP UPSTREAM Pharmacist BP ranges:[phone]}  Office blood pressures are  BP Readings from Last 3 Encounters:  08/27/20 128/80  08/09/20 130/86  05/06/20 132/64    Patient has failed these meds in the past: none reported Patient is currently controlled/uncontrolled*** on the following medications: ***  Lisinopril 5 mg bid   Patient checks BP at home {CHL HP BP Monitoring [phone]}  Patient home BP readings are ranging: ***  We discussed {CHL HP Upstream Pharmacy [phone]}  Plan  Continue {CHL HP Upstream Pharmacy [phone]}      COPD / Asthma / Tobacco   Last spirometry score: ***  Gold Grade: {CHL HP Upstream Pharm COPD Gold [id number] Current COPD Classification:  {CHL HP Upstream Pharm COPD [phone]}  Eosinophil count:   Lab Results  Component Value Date/Time   EOSPCT 0 07/05/2016 03:27 PM  %  Eos (Absolute):  Lab Results  Component Value Date/Time   EOSABS 0.0 07/05/2016 03:27 PM    Tobacco Status:  Social History   Tobacco Use  Smoking Status Current Every Day Smoker  . Packs/day: 1.00  . Years: 45.00  . Pack years: 45.00  . Types: Cigarettes  Smokeless Tobacco Former User    Patient has failed these meds in past: *** Patient is currently {CHL Controlled/Uncontrolled:[phone]} on the following medications: ***  Breztri 160/9/4.8 2  puffs into lungs twice daily   Using maintenance inhaler regularly? {yes/no:20286} Frequency of rescue inhaler use:  {CHL HP Upstream Pharm Inhaler ZOXW:[id number]}  We discussed:  {CHL HP Upstream Pharmacy [phone]}  Plan  Continue {CHL HP Upstream Pharmacy [phone]}  ***Gout   Patient has failed these meds in past: *** Patient is currently {CHL Controlled/Uncontrolled:[phone]} on the following medications:  . ***allopurinol 300 mg daily before breakfast  We discussed:  ***  Plan  Continue {CHL HP Upstream Pharmacy [phone]}  ***insomnia   Patient has failed these meds in past: *** Patient is currently {CHL Controlled/Uncontrolled:[phone]} on the following medications:  . ***  We discussed:  ***  Plan  Continue {CHL HP Upstream Pharmacy XBJYN:[id number]}   Rheumatoid arthritis   Lab Results  Component Value Date   CREATININE 0.87 07/05/2016   BUN 7 07/05/2016   GFRNONAA >60 07/05/2016   GFRAA >60 07/05/2016   NA 140 07/05/2016   K 3.8 07/05/2016   CALCIUM 9.3 07/05/2016   CO2 24 07/05/2016    Patient has failed these meds in past: *** Patient is currently {CHL Controlled/Uncontrolled:[phone]} on the following medications:  . ***Enbrel 50 mg/ml injection  . Methotrexate 10 mg weekly *** . Folic acid 1 mg *** . Celecoxib 200 mg ***  We discussed:  ***  Plan  Continue {CHL HP Upstream Pharmacy MVHQI:[id number]}  Health Maintenance   Patient is currently {CHL Controlled/Uncontrolled:[phone]} on the following medications:  . ***  We discussed:  ***  Plan  Continue {CHL HP Upstream Pharmacy LKGMW:[id number]}  Vaccines   Reviewed and discussed patient's vaccination history.    Immunization History  Administered Date(s) Administered  . Fluad Quad(high Dose 65+) 07/31/2020  . Influenza-Unspecified 06/11/2019  . PFIZER SARS-COV-2 Vaccination 05/16/2020, 06/13/2020  . Pneumococcal Conjugate-13 09/22/2015   . Pneumococcal Polysaccharide-23 09/17/2013  . Td 01/02/2019  . Tdap 10/25/2011    Plan  Recommended patient receive *** vaccine in *** office.   Medication Management   Patient's preferred pharmacy is:  [place], [place] [address] Phone: [phone] Fax: [phone]  Uses pill box? {Yes or If no, why not?:20788} Pt endorses ***% compliance  We discussed: {Pharmacy options:24294}  Plan  {[facility] CZYS:06301}    Follow up: *** month phone visit  ***

## 2020-10-13 ENCOUNTER — Telehealth: Payer: PPO

## 2020-10-13 ENCOUNTER — Telehealth (INDEPENDENT_AMBULATORY_CARE_PROVIDER_SITE_OTHER): Payer: PPO | Admitting: Nurse Practitioner

## 2020-10-13 ENCOUNTER — Encounter: Payer: Self-pay | Admitting: Nurse Practitioner

## 2020-10-13 VITALS — HR 78 | Ht 68.0 in | Wt 167.0 lb

## 2020-10-13 DIAGNOSIS — F17219 Nicotine dependence, cigarettes, with unspecified nicotine-induced disorders: Secondary | ICD-10-CM | POA: Diagnosis not present

## 2020-10-13 DIAGNOSIS — Z20822 Contact with and (suspected) exposure to covid-19: Secondary | ICD-10-CM | POA: Diagnosis not present

## 2020-10-13 DIAGNOSIS — D84821 Immunodeficiency due to drugs: Secondary | ICD-10-CM

## 2020-10-13 DIAGNOSIS — J0191 Acute recurrent sinusitis, unspecified: Secondary | ICD-10-CM

## 2020-10-13 DIAGNOSIS — Z79899 Other long term (current) drug therapy: Secondary | ICD-10-CM

## 2020-10-13 DIAGNOSIS — J302 Other seasonal allergic rhinitis: Secondary | ICD-10-CM | POA: Diagnosis not present

## 2020-10-13 LAB — POC COVID19 BINAXNOW: SARS Coronavirus 2 Ag: NEGATIVE

## 2020-10-13 MED ORDER — AMOXICILLIN-POT CLAVULANATE 875-125 MG PO TABS: 1.0000 | ORAL_TABLET | Freq: Two times a day (BID) | ORAL | 0 refills | Status: DC

## 2020-10-13 MED ORDER — FLUTICASONE PROPIONATE 50 MCG/ACT NA SUSP: 2.0000 | Freq: Every day | NASAL | 1 refills | Status: DC

## 2020-10-13 MED ORDER — LORATADINE 10 MG PO TABS: 10.0000 mg | ORAL_TABLET | Freq: Every day | ORAL | 1 refills | Status: DC

## 2020-10-13 NOTE — Progress Notes (Signed)
Virtual Visit via Telephone Note   This visit type was conducted due to national recommendations for restrictions regarding the COVID-19 Pandemic (e.g. social distancing) in an effort to limit this patient's exposure and mitigate transmission in our community.  Due to his co-morbid illnesses, this patient is at least at moderate risk for complications without adequate follow up.  This format is felt to be most appropriate for this patient at this time.  The patient did not have access to video technology/had technical difficulties with video requiring transitioning to audio format only (telephone).  All issues noted in this document were discussed and addressed.  No physical exam could be performed with this format.  Patient verbally consented to a telehealth visit.   Date:  10/13/2020   ID:  Gerald Odom, DOB July 01, 1951, MRN [id number]  Patient Location: Home Provider Location: Office/Clinic  PCP:  [name], MD   Evaluation Performed:  Established patient, acute telemedicine visit  Chief Complaint:  Sinus headache  History of Present Illness:    Gerald Odom is a 69 y.o. male with sinus headache, congestion, and pressure. Onset of symptoms was yesterday. Treatment has included Ibuprofen. He was treated 6-weeks ago for acute sinusitis. He was in close contact with positive COVID-19 family members 3-days ago. He has received COVID-19 and annual flu vaccines. He is a current ppd cigarette smoker for 45 years. Other past medical history includes allergic rhinitis, RA, COPD, hyperlipidemia, hypertension, and gout. He is currently prescribed methotrexate for RA.   The patient does have symptoms concerning for COVID-19 infection (fever, chills, cough, or new shortness of breath).    Past Medical History:  Diagnosis Date   Acute cholangitis 05/14/2016   Arthritis    Rheumatoid arthritis   Choledocholithiasis 05/16/2016   Gallstone pancreatitis 05/14/2016   Gout    Hearing loss  of left ear due to cerumen impaction 02/09/2020   Hyperlipidemia    Hypertension    Pancreatitis 05/2016   [facility]osp[facility]   Pneumonia 05/2016    Past Surgical History:  Procedure Laterality Date   CHOLECYSTECTOMY     CHOLECYSTECTOMY N/A 07/04/2016   Procedure: LAPAROSCOPIC CHOLECYSTECTOMY WITH INTRAOPERATIVE CHOLANGIOGRAM;  Surgeon: Jackolyn Confer, MD;  Location: [facility];  Service: General;  Laterality: N/A;   ERCP N/A 05/16/2016   Procedure: ENDOSCOPIC RETROGRADE CHOLANGIOPANCREATOGRAPHY (ERCP);  Surgeon: Doran Stabler, MD;  Location: [facility];  Service: Endoscopy;  Laterality: N/A;   KNEE SURGERY Left 1997    Family History  Problem Relation Age of Onset   Anxiety disorder Father     Social History   Socioeconomic History   Marital status: Married    Spouse name: Not on file   Number of children: Not on file   Years of education: Not on file   Highest education level: Not on file  Occupational History   Not on file  Tobacco Use   Smoking status: Current Every Day Smoker    Packs/day: 1.00    Years: 45.00    Pack years: 45.00    Types: Cigarettes   Smokeless tobacco: Former Network engineer and Sexual Activity   Alcohol use: No   Drug use: No   Sexual activity: Not on file  Other Topics Concern   Not on file  Social History Narrative   Not on file   Social Determinants of Health   Financial Resource Strain: Not on file  Food Insecurity: Not on file  Transportation Needs: Not on file  Physical Activity: Not on file  Stress: Not on file  Social Connections: Not on file  Intimate Partner Violence: Not on file    Outpatient Medications Prior to Visit  Medication Sig Dispense Refill   allopurinol (ZYLOPRIM) 300 MG tablet Take 1 tablet (300 mg total) by mouth daily before breakfast. 90 tablet 1   Budeson-Glycopyrrol-Formoterol (BREZTRI AEROSPHERE) 160-9-4.8 MCG/ACT AERO Inhale 2 puffs into the lungs 2 (two) times daily. 10.7 g 5    celecoxib (CELEBREX) 100 MG capsule Take 200 mg by mouth.      etanercept (ENBREL) 50 MG/ML injection      folic acid (FOLVITE) 1 MG tablet Take by mouth.     lisinopril (ZESTRIL) 5 MG tablet Take 1 tablet (5 mg total) by mouth 2 (two) times daily. 60 tablet 2   methotrexate (RHEUMATREX) 2.5 MG tablet Take 10 mg by mouth once a week.     traZODone (DESYREL) 50 MG tablet Take 1 tablet (50 mg total) by mouth at bedtime. 30 tablet 2   amoxicillin (AMOXIL) 875 MG tablet Take 1 tablet (875 mg total) by mouth 2 (two) times daily. 20 tablet 0   benzonatate (TESSALON) 200 MG capsule Take by mouth.     No facility-administered medications prior to visit.    Allergies:   Patient has no known allergies.   Social History   Tobacco Use   Smoking status: Current Every Day Smoker    Packs/day: 1.00    Years: 45.00    Pack years: 45.00    Types: Cigarettes   Smokeless tobacco: Former Neurosurgeon  Substance Use Topics   Alcohol use: No   Drug use: No     Review of Systems  Constitutional: Negative for chills, fever and malaise/fatigue.  HENT: Positive for congestion (sinus pressure) and sinus pain. Negative for ear pain and sore throat.   Respiratory: Negative for cough, sputum production, shortness of breath and wheezing.   Cardiovascular: Negative for chest pain.  Gastrointestinal: Negative for abdominal pain, constipation, diarrhea, nausea and vomiting.  Genitourinary: Negative for frequency and urgency.  Musculoskeletal: Negative for back pain, joint pain and myalgias.  Neurological: Positive for headaches. Negative for dizziness.  Endo/Heme/Allergies: Positive for environmental allergies.       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    No physical exam performed due to telemedicine visit  ASSESSMENT & PLAN:    1. Close exposure to COVID-19 virus - POC COVID-19 - Novel Coronavirus, NAA ([facility])  2. Acute recurrent sinusitis, unspecified location - amoxicillin-clavulanate (AUGMENTIN) 875-125 MG tablet; Take 1 tablet by mouth 2 (two) times daily.  Dispense: 14 tablet; Refill: 0 - fluticasone (FLONASE) 50 MCG/ACT nasal spray; Place 2  sprays into both nostrils daily.  Dispense: 16 g; Refill: 1 - loratadine (CLARITIN) 10 MG tablet; Take 1 tablet (10 mg total) by mouth daily.  Dispense: 90 tablet; Refill: 1  3. Cigarette nicotine dependence with nicotine-induced disorder -Recommend smoking cessation  4. Seasonal allergic rhinitis, unspecified trigger -Flonase nasal spray -Claritin 10 mg po daily   Orders Placed This Encounter  Procedures   Novel Coronavirus, NAA ([facility])   POC COVID-19     Negative COVID-19 test. Pt instructed to isolate at home pending PCR test results and to seek emergency medical care if symptoms worsen.    COVID-19 Education: The signs and symptoms of COVID-19 were discussed with the patient and how to seek care for testing (follow up with PCP or arrange E-visit). The importance of social distancing was discussed today.  Telephone time 11:25-11:37  I spent 15 minutes dedicated to the care of this patient on the date of this encounter to include telephone time with the patient, as well as:EMR review and prescription management.  Follow Up:  Virtual Visit  prn  Varney Baas, DNP  10/14/2020 08:12 AM    [place]

## 2020-10-15 LAB — NOVEL CORONAVIRUS, NAA: SARS-CoV-2, NAA: NOT DETECTED

## 2020-10-15 LAB — SARS-COV-2, NAA 2 DAY TAT

## 2020-10-17 HISTORY — PX: CATARACT EXTRACTION: SUR2

## 2020-10-19 ENCOUNTER — Telehealth: Payer: PPO

## 2020-10-19 NOTE — Chronic Care Management (AMB) (Deleted)
Chronic Care Management Pharmacy  Name: Gerald Odom  MRN: [id number] DOB: 02/10/1951  Chief Complaint/ HPI  Gerald Odom,  70 y.o. , male presents for their Initial CCM visit with the clinical pharmacist {CHL HP Upstream Pharm visit [id number]}.  PCP : Blane Ohara, MD  Their chronic conditions include: hypertension, GERD, chronic gout, rheumatoid arthritis, tobacco abuse.   Office Visits: 10/13/2020 - video visit for questionable Augmentin, loratadine and fluticasone.  08/27/2020 - mucinex DM and zyrtec samples given. Amoxicillin 875 mg bid x10 days. Prescribed trazodone 50 mg at bedtime for insomina.  07/31/2020 - Start Breztri 2 puffs twice daily. Flu shot in office.  05/06/2020 - ordered chest x-ray for cough. Consider CT scan for lung evaluation-screening. Acute bronchitis zithromax and mucinex DM.  Consult Visit:*** 07/15/2020 - rheumatology - *** continue Celebrex, Enbrel SQ, increase methotrexate 15 mg weekly and Folic acid. Take prednisone prn.  Medications: Outpatient Encounter Medications as of 10/19/2020  Medication Sig  . allopurinol (ZYLOPRIM) 300 MG tablet Take 1 tablet (300 mg total) by mouth daily before breakfast.  . amoxicillin-clavulanate (AUGMENTIN) 875-125 MG tablet Take 1 tablet by mouth 2 (two) times daily.  . Budeson-Glycopyrrol-Formoterol (BREZTRI AEROSPHERE) 160-9-4.8 MCG/ACT AERO Inhale 2 puffs into the lungs 2 (two) times daily.  . celecoxib (CELEBREX) 100 MG capsule Take 200 mg by mouth.   . etanercept (ENBREL) 50 MG/ML injection   . fluticasone (FLONASE) 50 MCG/ACT nasal spray Place 2 sprays into both nostrils daily.  . folic acid (FOLVITE) 1 MG tablet Take by mouth.  Marland Kitchen lisinopril (ZESTRIL) 5 MG tablet Take 1 tablet (5 mg total) by mouth 2 (two) times daily.  Marland Kitchen loratadine (CLARITIN) 10 MG tablet Take 1 tablet (10 mg total) by mouth daily.  . methotrexate (RHEUMATREX) 2.5 MG tablet Take 10 mg by mouth once a week.  . traZODone (DESYREL) 50 MG  tablet Take 1 tablet (50 mg total) by mouth at bedtime.   No facility-administered encounter medications on file as of 10/19/2020.   No Known Allergies  SDOH Screenings   Alcohol Screen: Low Risk   . Last Alcohol Screening Score (AUDIT): 1  Depression (PHQ2-9): Low Risk   . PHQ-2 Score: 0  Financial Resource Strain: Not on file  Food Insecurity: Not on file  Housing: Not on file  Physical Activity: Not on file  Social Connections: Not on file  Stress: Not on file  Tobacco Use: High Risk  . Smoking Tobacco Use: Current Every Day Smoker  . Smokeless Tobacco Use: Former Dispensing optician Needs: Not on file     Current Diagnosis/Assessment:  Goals Addressed   None     Hypertension   BP today is:  {CHL HP UPSTREAM Pharmacist BP [phone]}  Office blood pressures are  BP Readings from Last 3 Encounters:  08/27/20 128/80  08/09/20 130/86  05/06/20 132/64    Patient has failed these meds in the past: none reported Patient is currently controlled/uncontrolled*** on the following medications: ***  Lisinopril 5 mg bid   Patient checks BP at home {CHL HP BP Monitoring Frequency:[phone]}  Patient home BP readings are ranging: ***  We discussed {CHL HP Upstream Pharmacy [phone]}  Plan  Continue {CHL HP Upstream Pharmacy Plans:[phone]}      COPD / Asthma / Tobacco   Last spirometry score: ***  Gold Grade: {CHL HP Upstream Pharm COPD Gold ZDGUY:[id number]} Current COPD Classification:  {CHL HP Upstream Pharm COPD Classification:[phone]}  Eosinophil count:   Lab Results  Component  Value Date/Time   EOSPCT 0 07/05/2016 03:27 PM  %                               Eos (Absolute):  Lab Results  Component Value Date/Time   EOSABS 0.0 07/05/2016 03:27 PM    Tobacco Status:  Social History   Tobacco Use  Smoking Status Current Every Day Smoker  . Packs/day: 1.00  . Years: 45.00  . Pack years: 45.00  . Types: Cigarettes   Smokeless Tobacco Former User    Patient has failed these meds in past: *** Patient is currently {CHL Controlled/Uncontrolled:[phone]} on the following medications: ***  Breztri 160/9/4.8 2 puffs into lungs twice daily   Using maintenance inhaler regularly? {yes/no:20286} Frequency of rescue inhaler use:  {CHL HP Upstream Pharm Inhaler KL:[id number]  We discussed:  {CHL HP Upstream Pharmacy [phone]}  Plan  Continue {CHL HP Upstream Pharmacy Plans:[phone]}  ***Gout   Patient has failed these meds in past: *** Patient is currently {CHL Controlled/Uncontrolled:[phone]} on the following medications:  . ***allopurinol 300 mg daily before breakfast  We discussed:  ***  Plan  Continue {CHL HP Upstream Pharmacy Plans:[phone]}  ***insomnia   Patient has failed these meds in past: *** Patient is currently {CHL Controlled/Uncontrolled:[phone]} on the following medications:  . ***  We discussed:  ***  Plan  Continue {CHL HP Upstream Pharmacy PH:[id number]   Rheumatoid arthritis   Lab Results  Component Value Date   CREATININE 0.87 07/05/2016   BUN 7 07/05/2016   GFRNONAA >60 07/05/2016   GFRAA >60 07/05/2016   NA 140 07/05/2016   K 3.8 07/05/2016   CALCIUM 9.3 07/05/2016   CO2 24 07/05/2016    Patient has failed these meds in past: *** Patient is currently {CHL Controlled/Uncontrolled:[phone]} on the following medications:  . ***Enbrel 50 mg/ml injection  . Methotrexate 10 mg weekly *** . Folic acid 1 mg *** . Celecoxib 200 mg ***  We discussed:  ***  Plan  Continue {CHL HP Upstream Pharmacy PH:[id number]  Health Maintenance   Patient is currently {CHL Controlled/Uncontrolled:[phone]} on the following medications:  . ***  We discussed:  ***  Plan  Continue {CHL HP Upstream Pharmacy PH:[id number]  Vaccines   Reviewed and discussed patient's vaccination history.    Immunization History   Administered Date(s) Administered  . Fluad Quad(high Dose 65+) 07/31/2020  . Influenza-Unspecified 06/11/2019  . PFIZER SARS-COV-2 Vaccination 05/16/2020, 06/13/2020  . Pneumococcal Conjugate-13 09/22/2015  . Pneumococcal Polysaccharide-23 09/17/2013  . Td 01/02/2019  . Tdap 10/25/2011    Plan  Recommended patient receive *** vaccine in *** office.   Medication Management   Patient's preferred pharmacy is:  [place], [place] [address] Phone: [phone] Fax: [phone]  Uses pill box? {Yes or If no, why not?:20788} Pt endorses ***% compliance  We discussed: {Pharmacy options:24294}  Plan  {US Pharmacy MN:[id number]    Follow up: *** month phone visit  ***

## 2020-11-06 NOTE — Chronic Care Management (AMB) (Signed)
Chronic Care Management Pharmacy  Name: Gerald Odom  MRN: [id number] DOB: [birth year]  Chief Complaint/ HPI  Gerald Odom,  70 y.o. , male presents for their Initial CCM visit with the clinical pharmacist via telephone due to COVID-19 Pandemic.  PCP : [name], MD   Plan Recommendations:   Patient is considering trying nicotine patches for next smoking cessation option.   Patient plans to discuss potential ENT referral at next visit.   Their chronic conditions include: hypertension, GERD, chronic gout, rheumatoid arthritis, tobacco abuse.   Office Visits: 10/13/2020 - video visit for questionable Augmentin, loratadine and fluticasone.  08/27/2020 - mucinex DM and zyrtec samples given. Amoxicillin 875 mg bid x10 days. Prescribed trazodone 50 mg at bedtime for insomina.  07/31/2020 - Start Breztri 2 puffs twice daily. Flu shot in office.  05/06/2020 - ordered chest x-ray for cough. Consider CT scan for lung evaluation-screening. Acute bronchitis zithromax and mucinex DM.  Consult Visit: 07/15/2020 - rheumatology -  continue Celebrex, Enbrel SQ, increase methotrexate 15 mg weekly and Folic acid. Take prednisone prn.  Medications: Outpatient Encounter Medications as of 11/10/2020  Medication Sig  . allopurinol (ZYLOPRIM) 300 MG tablet Take 1 tablet (300 mg total) by mouth daily before breakfast.  . Budeson-Glycopyrrol-Formoterol (BREZTRI AEROSPHERE) 160-9-4.8 MCG/ACT AERO Inhale 2 puffs into the lungs 2 (two) times daily.  Marland Kitchen etanercept (ENBREL) 50 MG/ML injection Inject 50 mg into the skin once a week.  . fluticasone (FLONASE) 50 MCG/ACT nasal spray Place 2 sprays into both nostrils daily.  . folic acid (FOLVITE) 1 MG tablet Take by mouth.  Marland Kitchen lisinopril (ZESTRIL) 5 MG tablet Take 1 tablet (5 mg total) by mouth 2 (two) times daily.  . methotrexate (RHEUMATREX) 2.5 MG tablet Take 15 mg by mouth once a week.  . traZODone (DESYREL) 50 MG tablet Take 1 tablet (50 mg total) by mouth  at bedtime.  Marland Kitchen amoxicillin-clavulanate (AUGMENTIN) 875-125 MG tablet Take 1 tablet by mouth 2 (two) times daily. (Patient not taking: Reported on 11/10/2020)  . celecoxib (CELEBREX) 100 MG capsule Take 200 mg by mouth.  (Patient not taking: Reported on 11/10/2020)  . loratadine (CLARITIN) 10 MG tablet Take 1 tablet (10 mg total) by mouth daily. (Patient not taking: Reported on 11/10/2020)   No facility-administered encounter medications on file as of 11/10/2020.   No Known Allergies  SDOH Screenings   Alcohol Screen: Low Risk   . Last Alcohol Screening Score (AUDIT): 1  Depression (PHQ2-9): Low Risk   . PHQ-2 Score: 0  Financial Resource Strain: Not on file  Food Insecurity: No Food Insecurity  . Worried About Charity fundraiser in the Last Year: Never true  . Ran Out of Food in the Last Year: Never true  Housing: Not on file  Physical Activity: Not on file  Social Connections: Not on file  Stress: Not on file  Tobacco Use: High Risk  . Smoking Tobacco Use: Current Every Day Smoker  . Smokeless Tobacco Use: Former Soil scientist Needs: No Transportation Needs  . Lack of Transportation (Medical): No  . Lack of Transportation (Non-Medical): No    Current Diagnosis/Assessment:  Goals Addressed            This Visit's Progress   . Pharmacy Care Plan       CARE PLAN ENTRY (see longitudinal plan of care for additional care plan information)  Current Barriers:  . Chronic Disease Management support, education, and care coordination needs related to  Hypertension and COPD   Hypertension BP Readings from Last 3 Encounters:  08/27/20 128/80  08/09/20 130/86  05/06/20 132/64   . Pharmacist Clinical Goal(s): o Over the next 90 days, patient will work with PharmD and providers to maintain BP goal <140/90 . Current regimen:  o Lisinopril 5 mg bid  . Interventions: o Reviewed exercise and diet regimen.  o Discussed smoking cessation.  o Coordinated refill sent to [facility]  . Patient self care activities - Over the next 90 days, patient will: o Continue taking medications as prescribed.  o Ensure daily salt intake < 2300 mg/day   COPD . Pharmacist Clinical Goal(s) o Over the next 90 days, patient will work with PharmD and providers to manage COPD . Current regimen:  o Breztri 2 puffs twice daily . Interventions: o Discussed smoking cessations and option of considering Nicoderm patc or bupropion.  o Reviewed option of patient assistance for [employer].  . Patient self care activities - Over the next 90 days, patient will: o Continue using Breztri twice daily as prescribed.  o Contact pharmacist for patient assistance if needed.   Medication management . Pharmacist Clinical Goal(s): o Over the next 90 days, patient will work with PharmD and providers to maintain optimal medication adherence . Current pharmacy: [facility]  . Interventions o Comprehensive medication review performed. o Continue current medication management strategy . Patient self care activities - Over the next 90 days, patient will: o Focus on medication adherence by taking medication daily as prescribed.  o Take medications as prescribed o Report any questions or concerns to PharmD and/or provider(s)  Initial goal documentation        Hypertension   BP today is:  <130/80  Office blood pressures are  BP Readings from Last 3 Encounters:  08/27/20 128/80  08/09/20 130/86  05/06/20 132/64    Patient has failed these meds in the past: none reported Patient is currently controlled on the following medications:   Lisinopril 5 mg bid   Patient checks BP at home when feeling symptomatic  Patient home BP readings are ranging: not checking currently   We discussed diet and exercise extensively.   Diet: Eats what he wants. Doesn't follow a special diet. Doesn't use a lot of salt with his food. Drinks coffee in the morning and soft drink every other day. Eats whole  wheat bread.   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 Used to work out with his son years ago. States he felt better when he worked out more but stays very active. .   Diet:   Encouraged smoking cessation. Patient may try nicoderm patch next.   Plan  Continue current medications     COPD / Asthma / Tobacco   Eosinophil count:   Lab Results  Component Value Date/Time   EOSPCT 0 07/05/2016 03:27 PM  %                               Eos (Absolute):  Lab Results  Component Value Date/Time   EOSABS 0.0 07/05/2016 03:27 PM    Tobacco Status:  Social History   Tobacco Use  Smoking Status Current Every Day Smoker  . Packs/day: 1.00  . Years: 45.00  . Pack years: 45.00  . Types: Cigarettes  Smokeless Tobacco Former User    Patient has failed these meds in past: none reported  Patient  is currently controlled on the following medications:   Breztri 160/9/4.8 2 puffs into lungs twice daily   Using maintenance inhaler regularly? Yes Frequency of rescue inhaler use:  never  We discussed:  proper inhaler technique   Smoking: very hard to quit smoking. He might go 3-4 days only smoking 3-4 a day. Another day he may smoke 1/2 pack per day. Discussed option of trying nicotine patch or bupropion if/when ready. Patient's wife quit smoking over a year ago. Patient know that tobacco is "killing" him.   Patient works outside a lot. States Judithann Sauger is affordable for him and working well. Patient has tried and failed Chantix several times. Has quit for 6-7 months in the past but started back up. Patient may try nicotine patches to help with smoking cessation. .   Plan  Continue current medications  Gout   Patient has failed these meds in past: none reported Patient is currently controlled on the following medications:  . allopurinol 300 mg daily before breakfast  We discussed:  Denies flare ups and states tolerating medication well.    Plan  Continue current medications  Insomnia   Patient has failed these meds in past: none reported Patient is currently controlled on the following medications:  . trazodone 50 mg at bedtime   We discussed:  Patient has had better sleep with trazodone. Sleeps 8 hours with medication. Trazodone helps his mind stop racing so he can rest. Pharmacist coordinating refill sent to [facility] drug.   Plan  Continue current medications   Rheumatoid arthritis   Lab Results  Component Value Date   CREATININE 0.87 07/05/2016   BUN 7 07/05/2016   GFRNONAA >60 07/05/2016   GFRAA >60 07/05/2016   NA 140 07/05/2016   K 3.8 07/05/2016   CALCIUM 9.3 07/05/2016   CO2 24 07/05/2016    Patient has failed these meds in past: none reported Patient is currently controlled on the following medications:  . Enbrel 50 mg/ml injection weekly  . Methotrexate 15 mg weekly  . Folic acid 1 mg daily  . Celecoxib 200 mg daily prn   We discussed:  Enbrel is covered through [employer]. Rarely takes Celebrex for pain. Medication regimen is effective and managed by Dr. Posey Pronto. Patient has struggled with flares in the past associated with stress. .   Plan  Continue current medications  Health Maintenance   Patient is currently controlled on the following medications:  . Loratadine daily prn - allergies . Flonase prn allergies   We discussed:  Patient is concerned he is having an issue with a deviated septum. Will discuss with Dr. Tobie Poet at upcoming appointment to determine if needs an ENT referral. Taking flonase and loratadine as needed for allergy symptoms.   Plan  Continue current medications  Vaccines   Reviewed and discussed patient's vaccination history.    Immunization History  Administered Date(s) Administered  . Fluad Quad(high Dose 65+) 07/31/2020  . Influenza-Unspecified 06/11/2019  . PFIZER(Purple Top)SARS-COV-2 Vaccination 05/16/2020, 06/13/2020  . Pneumococcal Conjugate-13  09/22/2015  . Pneumococcal Polysaccharide-23 09/17/2013  . Td 01/02/2019  . Tdap 10/25/2011    Plan  Recommended patient receive Shingles and Covid booster vaccine.    Medication Management   Patient's preferred pharmacy is:  [facility], [place] [address] Phone: [phone] Fax: [phone]  Uses pill box? No - takes medication as prescribed.  Pt endorses good compliance  We discussed: Discussed  benefits of medication synchronization, packaging and delivery as well as enhanced pharmacist oversight with [employer].  Plan  Continue current medication management strategy    Follow up: 6 month phone visit

## 2020-11-10 ENCOUNTER — Other Ambulatory Visit: Payer: Self-pay

## 2020-11-10 ENCOUNTER — Ambulatory Visit: Payer: PPO

## 2020-11-10 DIAGNOSIS — I10 Essential (primary) hypertension: Secondary | ICD-10-CM

## 2020-11-10 DIAGNOSIS — J418 Mixed simple and mucopurulent chronic bronchitis: Secondary | ICD-10-CM

## 2020-11-10 NOTE — Patient Instructions (Addendum)
Visit Information  Thank you for your time discussing your medications. I look forward to working with you to achieve your health care goals. Below is a summary of what we talked about during our visit.   Goals Addressed            This Visit's Progress   . Pharmacy Care Plan       CARE PLAN ENTRY (see longitudinal plan of care for additional care plan information)  Current Barriers:  . Chronic Disease Management support, education, and care coordination needs related to Hypertension and COPD   Hypertension BP Readings from Last 3 Encounters:  08/27/20 128/80  08/09/20 130/86  05/06/20 132/64   . Pharmacist Clinical Goal(s): o Over the next 90 days, patient will work with PharmD and providers to maintain BP goal <140/90 . Current regimen:  o Lisinopril 5 mg bid  . Interventions: o Reviewed exercise and diet regimen.  o Discussed smoking cessation.  o Coordinated refill sent to [facility].  . Patient self care activities - Over the next 90 days, patient will: o Continue taking medications as prescribed.  o Ensure daily salt intake < 2300 mg/day   COPD . Pharmacist Clinical Goal(s) o Over the next 90 days, patient will work with PharmD and providers to manage COPD . Current regimen:  o Breztri 2 puffs twice daily . Interventions: o Discussed smoking cessations and option of considering Nicoderm patc or bupropion.  o Reviewed option of patient assistance for [employer].  . Patient self care activities - Over the next 90 days, patient will: o Continue using Breztri twice daily as prescribed.  o Contact pharmacist for patient assistance if needed.   Medication management . Pharmacist Clinical Goal(s): o Over the next 90 days, patient will work with PharmD and providers to maintain optimal medication adherence . Current pharmacy: [facility]  . Interventions o Comprehensive medication review performed. o Continue current medication management strategy . Patient self  care activities - Over the next 90 days, patient will: o Focus on medication adherence by taking medication daily as prescribed.  o Take medications as prescribed o Report any questions or concerns to PharmD and/or provider(s)  Initial goal documentation        Gerald Odom was given information about Chronic Care Management services today including:  1. CCM service includes personalized support from designated clinical staff supervised by his physician, including individualized plan of care and coordination with other care providers 2. 24/7 contact phone numbers for assistance for urgent and routine care needs. 3. Standard insurance, coinsurance, copays and deductibles apply for chronic care management only during months in which we provide at least 20 minutes of these services. Most insurances cover these services at 100%, however patients may be responsible for any copay, coinsurance and/or deductible if applicable. This service may help you avoid the need for more expensive face-to-face services. 4. Only one practitioner may furnish and bill the service in a calendar month. 5. The patient may stop CCM services at any time (effective at the end of the month) by phone call to the office staff.  Patient agreed to services and verbal consent obtained.   The patient verbalized understanding of instructions, educational materials, and care plan provided today and declined offer to receive copy of patient instructions, educational materials, and care plan.  Telephone follow up appointment with pharmacy team member scheduled for: July 2022  Sherre Poot, PharmD Clinical Pharmacist [facility] [phone] (office) [phone] (mobile)  Steps to  Quit Smoking Smoking tobacco is the leading cause of preventable death. It can affect almost every organ in the body. Smoking puts you and people around you at risk for many serious, long-lasting (chronic) diseases. Quitting smoking can be  hard, but it is one of the best things that you can do for your health. It is never too late to quit. How do I get ready to quit? When you decide to quit smoking, make a plan to help you succeed. Before you quit:  Pick a date to quit. Set a date within the next 2 weeks to give you time to prepare.  Write down the reasons why you are quitting. Keep this list in places where you will see it often.  Tell your family, friends, and co-workers that you are quitting. Their support is important.  Talk with your doctor about the choices that may help you quit.  Find out if your health insurance will pay for these treatments.  Know the people, places, things, and activities that make you want to smoke (triggers). Avoid them. What first steps can I take to quit smoking?  Throw away all cigarettes at home, at work, and in your car.  Throw away the things that you use when you smoke, such as ashtrays and lighters.  Clean your car. Make sure to empty the ashtray.  Clean your home, including curtains and carpets. What can I do to help me quit smoking? Talk with your doctor about taking medicines and seeing a counselor at the same time. You are more likely to succeed when you do both.  If you are pregnant or breastfeeding, talk with your doctor about counseling or other ways to quit smoking. Do not take medicine to help you quit smoking unless your doctor tells you to do so. To quit smoking: Quit right away  Quit smoking totally, instead of slowly cutting back on how much you smoke over a period of time.  Go to counseling. You are more likely to quit if you go to counseling sessions regularly. Take medicine You may take medicines to help you quit. Some medicines need a prescription, and some you can buy over-the-counter. Some medicines may contain a drug called nicotine to replace the nicotine in cigarettes. Medicines may:  Help you to stop having the desire to smoke (cravings).  Help to stop  the problems that come when you stop smoking (withdrawal symptoms). Your doctor may ask you to use:  Nicotine patches, gum, or lozenges.  Nicotine inhalers or sprays.  Non-nicotine medicine that is taken by mouth. Find resources Find resources and other ways to help you quit smoking and remain smoke-free after you quit. These resources are most helpful when you use them often. They include:  Online chats with a Social worker.  Phone quitlines.  Printed Furniture conservator/restorer.  Support groups or group counseling.  Text messaging programs.  Mobile phone apps. Use apps on your mobile phone or tablet that can help you stick to your quit plan. There are many free apps for mobile phones and tablets as well as websites. Examples include Quit Guide from the [employer] and smokefree.gov   What things can I do to make it easier to quit?  Talk to your family and friends. Ask them to support and encourage you.  Call a phone quitline (1-800-QUIT-NOW), reach out to support groups, or work with a Social worker.  Ask people who smoke to not smoke around you.  Avoid places that make you want to smoke, such  as: ? Bars. ? Parties. ? Smoke-break areas at work.  Spend time with people who do not smoke.  Lower the stress in your life. Stress can make you want to smoke. Try these things to help your stress: ? Getting regular exercise. ? Doing deep-breathing exercises. ? Doing yoga. ? Meditating. ? Doing a body scan. To do this, close your eyes, focus on one area of your body at a time from head to toe. Notice which parts of your body are tense. Try to relax the muscles in those areas.   How will I feel when I quit smoking? Day 1 to 3 weeks Within the first 24 hours, you may start to have some problems that come from quitting tobacco. These problems are very bad 2-3 days after you quit, but they do not often last for more than 2-3 weeks. You may get these symptoms:  Mood swings.  Feeling restless, nervous,  angry, or annoyed.  Trouble concentrating.  Dizziness.  Strong desire for high-sugar foods and nicotine.  Weight gain.  Trouble pooping (constipation).  Feeling like you may vomit (nausea).  Coughing or a sore throat.  Changes in how the medicines that you take for other issues work in your body.  Depression.  Trouble sleeping (insomnia). Week 3 and afterward After the first 2-3 weeks of quitting, you may start to notice more positive results, such as:  Better sense of smell and taste.  Less coughing and sore throat.  Slower heart rate.  Lower blood pressure.  Clearer skin.  Better breathing.  Fewer sick days. Quitting smoking can be hard. Do not give up if you fail the first time. Some people need to try a few times before they succeed. Do your best to stick to your quit plan, and talk with your doctor if you have any questions or concerns. Summary  Smoking tobacco is the leading cause of preventable death. Quitting smoking can be hard, but it is one of the best things that you can do for your health.  When you decide to quit smoking, make a plan to help you succeed.  Quit smoking right away, not slowly over a period of time.  When you start quitting, seek help from your doctor, family, or friends. This information is not intended to replace advice given to you by your health care provider. Make sure you discuss any questions you have with your health care provider. Document Revised: 06/28/2019 Document Reviewed: 12/22/2018 Elsevier Patient Education  [place].

## 2020-11-11 MED ORDER — TRAZODONE HCL 50 MG PO TABS: 50.0000 mg | ORAL_TABLET | Freq: Every day | ORAL | 1 refills | Status: DC

## 2020-11-11 MED ORDER — LISINOPRIL 5 MG PO TABS: 5.0000 mg | ORAL_TABLET | Freq: Two times a day (BID) | ORAL | 1 refills | Status: DC

## 2020-11-30 NOTE — Progress Notes (Signed)
Subjective:  Patient ID: Gerald Odom, male    DOB: 1951-04-04  Age: 70 y.o. MRN: [id number]  Chief Complaint  Patient presents with   Hypertension   Gastroesophageal Reflux    HPI Essential hypertension- lisinopril 5 mg 2 daily. Gout: allopurinol. Has been a long time since flared.  RA: Has flare ups occasionally and takes celebrex prn. On enbrel, MTX, folate. Also has prednisone for severe flare ups.  Insomnia takes Trazodone 50 mg qhs Copd: on breztri 2 puffs twice  Daily. Breathing is good.  Pt is complaining of nasal congestion at night. Has history of septal deviation which he received surgery for in the distant past. He has to use his arm to push on nose to open nasal passage. flonase helps some, but uses sporadically. Also has claritin, but not sure it helps at night.   Current Outpatient Medications on File Prior to Visit  Medication Sig Dispense Refill   allopurinol (ZYLOPRIM) 300 MG tablet Take 1 tablet (300 mg total) by mouth daily before breakfast. 90 tablet 1   Budeson-Glycopyrrol-Formoterol (BREZTRI AEROSPHERE) 160-9-4.8 MCG/ACT AERO Inhale 2 puffs into the lungs 2 (two) times daily. 10.7 g 5   celecoxib (CELEBREX) 100 MG capsule Take 200 mg by mouth.  (Patient not taking: Reported on 11/10/2020)     etanercept (ENBREL) 50 MG/ML injection Inject 50 mg into the skin once a week.     fluticasone (FLONASE) 50 MCG/ACT nasal spray Place 2 sprays into both nostrils daily. 16 g 1   folic acid (FOLVITE) 1 MG tablet Take by mouth.     lisinopril (ZESTRIL) 5 MG tablet Take 1 tablet (5 mg total) by mouth 2 (two) times daily. 180 tablet 1   loratadine (CLARITIN) 10 MG tablet Take 1 tablet (10 mg total) by mouth daily. (Patient not taking: Reported on 11/10/2020) 90 tablet 1   methotrexate (RHEUMATREX) 2.5 MG tablet Take 15 mg by mouth once a week.     traZODone (DESYREL) 50 MG tablet Take 1 tablet (50 mg total) by mouth at bedtime. 90 tablet 1   No current  facility-administered medications on file prior to visit.   Past Medical History:  Diagnosis Date   Acute cholangitis 05/14/2016   Arthritis    Rheumatoid arthritis   Choledocholithiasis 05/16/2016   Gallstone pancreatitis 05/14/2016   Gout    Hearing loss of left ear due to cerumen impaction 02/09/2020   Hyperlipidemia    Hypertension    Pancreatitis 05/2016   hosp. [facility]   Pneumonia 05/2016   Past Surgical History:  Procedure Laterality Date   CHOLECYSTECTOMY     CHOLECYSTECTOMY N/A 07/04/2016   Procedure: LAPAROSCOPIC CHOLECYSTECTOMY WITH INTRAOPERATIVE CHOLANGIOGRAM;  Surgeon: Jackolyn Confer, MD;  Location: [place];  Service: General;  Laterality: N/A;   ERCP N/A 05/16/2016   Procedure: ENDOSCOPIC RETROGRADE CHOLANGIOPANCREATOGRAPHY (ERCP);  Surgeon: Doran Stabler, MD;  Location: [facility];  Service: Endoscopy;  Laterality: N/A;   KNEE SURGERY Left 1997    Family History  Problem Relation Age of Onset   Anxiety disorder Father    Social History   Socioeconomic History   Marital status: Married    Spouse name: Not on file   Number of children: Not on file   Years of education: Not on file   Highest education level: Not on file  Occupational History   Not on file  Tobacco Use   Smoking status: Current Every Day Smoker    Packs/day: 0.25  Years: 45.00    Pack years: 11.25    Types: Cigarettes   Smokeless tobacco: Former Network engineer and Sexual Activity   Alcohol use: No   Drug use: No   Sexual activity: Not on file  Other Topics Concern   Not on file  Social History Narrative   Not on file   Social Determinants of Health   Financial Resource Strain: Not on file  Food Insecurity: No Food Insecurity   Worried About Charity fundraiser in the Last Year: Never true   Ran Out of Food in the Last Year: Never true  Transportation Needs: No Transportation Needs   Lack of Transportation (Medical): No   Lack of Transportation  (Non-Medical): No  Physical Activity: Not on file  Stress: Not on file  Social Connections: Not on file    Review of Systems  Constitutional: Negative for chills, diaphoresis, fatigue and fever.  HENT: Positive for congestion (history of deviated septum). Negative for ear pain and sore throat.   Respiratory: Negative for cough and shortness of breath.   Cardiovascular: Negative for chest pain and leg swelling.  Gastrointestinal: Negative for abdominal pain, constipation, diarrhea, nausea and vomiting.  Genitourinary: Negative for dysuria and urgency.  Musculoskeletal: Positive for arthralgias (hand pain) and myalgias.  Neurological: Negative for dizziness and headaches.  Psychiatric/Behavioral: Negative for dysphoric mood. The patient is not nervous/anxious.      Objective:  BP 128/78    Pulse 80    Temp (!) 96 F (35.6 C)    Ht 5\' 8"  (1.727 m)    Wt 164 lb 6.4 oz (74.6 kg)    BMI 25.00 kg/m   BP/Weight 12/01/2020 10/13/2020 27/78/2423  Systolic BP 536 - 144  Diastolic BP 78 - 80  Wt. (Lbs) 164.4 167 -  BMI 25 25.39 -    Physical Exam Vitals reviewed.  Constitutional:      Appearance: Normal appearance. He is normal weight.  HENT:     Nose: Septal deviation (left side.) present.  Neck:     Vascular: No carotid bruit.  Cardiovascular:     Rate and Rhythm: Normal rate and regular rhythm.     Heart sounds: Normal heart sounds.  Pulmonary:     Effort: Pulmonary effort is normal.     Breath sounds: Normal breath sounds.  Neurological:     Mental Status: He is alert and oriented to person, place, and time.  Psychiatric:        Mood and Affect: Mood normal.        Behavior: Behavior normal.     Diabetic Foot Exam - Simple   No data filed      Lab Results  Component Value Date   WBC 18.4 (H) 07/05/2016   HGB 14.2 07/05/2016   HCT 42.0 07/05/2016   PLT 243 07/05/2016   GLUCOSE 123 (H) 07/05/2016   CHOL 140 01/30/2020   TRIG 121 01/30/2020   HDL 40 01/30/2020    LDLCALC 78 01/30/2020   ALT 49 07/05/2016   AST 39 07/05/2016   NA 140 07/05/2016   K 3.8 07/05/2016   CL 107 07/05/2016   CREATININE 0.87 07/05/2016   BUN 7 07/05/2016   CO2 24 07/05/2016   TSH 1.880 01/30/2020   INR 0.97 05/14/2016      Assessment & Plan:   1. Essential hypertension, benign Well controlled.  No changes to medicines.  Continue to work on eating a healthy diet and exercise.  Labs  drawn today.  - CBC with Differential/Platelet - Comprehensive metabolic panel  2. Primary insomnia The current medical regimen is effective;  continue present plan and medications.  3. Nasal septal deviation - refer to ent.  4. Mixed simple and mucopurulent chronic bronchitis (HCC) The current medical regimen is effective;  continue present plan and medications.  5. Rheumatoid arthritis involving multiple sites with positive rheumatoid factor (HCC) The current medical regimen is effective;  continue present plan and medications. - CBC - CMP  6. Idiopathic chronic gout of multiple sites without tophus  The current medical regimen is effective;  continue present plan and medications.    Orders Placed This Encounter  Procedures   CBC with Differential/Platelet   Comprehensive metabolic panel      Follow-up: Return in about 3 months (around 02/28/2021) for AWV.  An After Visit Summary was printed and given to the patient.  [name], MD [facility] [phone]

## 2020-12-01 ENCOUNTER — Encounter: Payer: Self-pay | Admitting: Family Medicine

## 2020-12-01 ENCOUNTER — Other Ambulatory Visit: Payer: Self-pay

## 2020-12-01 ENCOUNTER — Ambulatory Visit (INDEPENDENT_AMBULATORY_CARE_PROVIDER_SITE_OTHER): Payer: PPO | Admitting: Family Medicine

## 2020-12-01 VITALS — BP 128/78 | HR 80 | Temp 96.0°F | Ht 68.0 in | Wt 164.4 lb

## 2020-12-01 DIAGNOSIS — M0579 Rheumatoid arthritis with rheumatoid factor of multiple sites without organ or systems involvement: Secondary | ICD-10-CM

## 2020-12-01 DIAGNOSIS — M1A09X0 Idiopathic chronic gout, multiple sites, without tophus (tophi): Secondary | ICD-10-CM | POA: Diagnosis not present

## 2020-12-01 DIAGNOSIS — J342 Deviated nasal septum: Secondary | ICD-10-CM | POA: Diagnosis not present

## 2020-12-01 DIAGNOSIS — F5101 Primary insomnia: Secondary | ICD-10-CM

## 2020-12-01 DIAGNOSIS — G4700 Insomnia, unspecified: Secondary | ICD-10-CM

## 2020-12-01 DIAGNOSIS — J418 Mixed simple and mucopurulent chronic bronchitis: Secondary | ICD-10-CM

## 2020-12-01 DIAGNOSIS — I10 Essential (primary) hypertension: Secondary | ICD-10-CM | POA: Diagnosis not present

## 2020-12-01 DIAGNOSIS — K219 Gastro-esophageal reflux disease without esophagitis: Secondary | ICD-10-CM

## 2020-12-01 LAB — COMPREHENSIVE METABOLIC PANEL
ALT: 12 IU/L (ref 0–44)
AST: 14 IU/L (ref 0–40)
Albumin/Globulin Ratio: 1.5 (ref 1.2–2.2)
Albumin: 4.1 g/dL (ref 3.7–4.7)
Alkaline Phosphatase: 90 IU/L (ref 44–121)
BUN/Creatinine Ratio: 12 (ref 10–24)
BUN: 13 mg/dL (ref 8–27)
Bilirubin Total: 0.4 mg/dL (ref 0.0–1.2)
CO2: 19 mmol/L — ABNORMAL LOW (ref 20–29)
Calcium: 9.3 mg/dL (ref 8.6–10.2)
Chloride: 106 mmol/L (ref 96–106)
Creatinine, Ser: 1.13 mg/dL (ref 0.76–1.27)
GFR calc Af Amer: 75 mL/min/{1.73_m2} (ref >59)
GFR calc non Af Amer: 65 mL/min/{1.73_m2} (ref >59)
Globulin, Total: 2.7 g/dL (ref 1.5–4.5)
Glucose: 87 mg/dL (ref 65–99)
Potassium: 4.2 mmol/L (ref 3.5–5.2)
Sodium: 140 mmol/L (ref 134–144)
Total Protein: 6.8 g/dL (ref 6.0–8.5)

## 2020-12-01 LAB — CBC WITH DIFFERENTIAL/PLATELET
Basophils Absolute: 0.1 10*3/uL (ref 0.0–0.2)
Basos: 1 %
EOS (ABSOLUTE): 0.2 10*3/uL (ref 0.0–0.4)
Eos: 2 %
Hematocrit: 47.6 % (ref 37.5–51.0)
Hemoglobin: 16.4 g/dL (ref 13.0–17.7)
Immature Grans (Abs): 0 10*3/uL (ref 0.0–0.1)
Immature Granulocytes: 0 %
Lymphocytes Absolute: 4.3 10*3/uL — ABNORMAL HIGH (ref 0.7–3.1)
Lymphs: 46 %
MCH: 33.3 pg — ABNORMAL HIGH (ref 26.6–33.0)
MCHC: 34.5 g/dL (ref 31.5–35.7)
MCV: 97 fL (ref 79–97)
Monocytes Absolute: 0.9 10*3/uL (ref 0.1–0.9)
Monocytes: 9 %
Neutrophils Absolute: 4 10*3/uL (ref 1.4–7.0)
Neutrophils: 42 %
Platelets: 248 10*3/uL (ref 150–450)
RBC: 4.93 x10E6/uL (ref 4.14–5.80)
RDW: 13 % (ref 11.6–15.4)
WBC: 9.5 10*3/uL (ref 3.4–10.8)

## 2020-12-08 DIAGNOSIS — Z79899 Other long term (current) drug therapy: Secondary | ICD-10-CM | POA: Diagnosis not present

## 2020-12-08 DIAGNOSIS — M1A09X0 Idiopathic chronic gout, multiple sites, without tophus (tophi): Secondary | ICD-10-CM | POA: Diagnosis not present

## 2020-12-08 DIAGNOSIS — M0579 Rheumatoid arthritis with rheumatoid factor of multiple sites without organ or systems involvement: Secondary | ICD-10-CM | POA: Diagnosis not present

## 2020-12-09 ENCOUNTER — Encounter: Payer: Self-pay | Admitting: Family Medicine

## 2020-12-10 ENCOUNTER — Encounter: Payer: Self-pay | Admitting: Family Medicine

## 2021-01-05 ENCOUNTER — Telehealth: Payer: Self-pay

## 2021-01-05 NOTE — Progress Notes (Signed)
    Chronic Care Management Pharmacy Assistant   Name: Gerald Odom  MRN: [id number] DOB: [birth year]  Reason for Encounter: General Adherence    Recent office visits:  12/01/20 OV - Dr. Tobie Poet   Recent consult visits:  12/09/20 Rheumatology - Dr. Sherwood Gambler visits:  None in previous 6 months  Medications: Outpatient Encounter Medications as of 01/05/2021  Medication Sig  . allopurinol (ZYLOPRIM) 300 MG tablet Take 1 tablet (300 mg total) by mouth daily before breakfast.  . Budeson-Glycopyrrol-Formoterol (BREZTRI AEROSPHERE) 160-9-4.8 MCG/ACT AERO Inhale 2 puffs into the lungs 2 (two) times daily.  . celecoxib (CELEBREX) 100 MG capsule Take 200 mg by mouth.  (Patient not taking: Reported on 11/10/2020)  . etanercept (ENBREL) 50 MG/ML injection Inject 50 mg into the skin once a week.  . fluticasone (FLONASE) 50 MCG/ACT nasal spray Place 2 sprays into both nostrils daily.  . folic acid (FOLVITE) 1 MG tablet Take by mouth.  Marland Kitchen lisinopril (ZESTRIL) 5 MG tablet Take 1 tablet (5 mg total) by mouth 2 (two) times daily.  Marland Kitchen loratadine (CLARITIN) 10 MG tablet Take 1 tablet (10 mg total) by mouth daily. (Patient not taking: Reported on 11/10/2020)  . methotrexate (RHEUMATREX) 2.5 MG tablet Take 15 mg by mouth once a week.  . traZODone (DESYREL) 50 MG tablet Take 1 tablet (50 mg total) by mouth at bedtime.   No facility-administered encounter medications on file as of 01/05/2021.   Spoke to the patient, he stated he is doing well.  He is taking his medications as directed, he stated they are working well.  He is not having any reported side effects, and does not have issues getting medications from the pharmacy.  The patient is staying active, he is down at his shed today installing security lights for his camper.  He stated he may have a deviated septum, he has an upcoming appointment with ENT, said he was going to have to call his insurance and check his copay amount.  His next appointment  with Dr. Tobie Poet is on 03/10/21  Clarita Leber, [place] Pharmacist Assistant [phone]

## 2021-01-12 DIAGNOSIS — F17200 Nicotine dependence, unspecified, uncomplicated: Secondary | ICD-10-CM | POA: Diagnosis not present

## 2021-01-12 DIAGNOSIS — I10 Essential (primary) hypertension: Secondary | ICD-10-CM | POA: Diagnosis not present

## 2021-01-12 DIAGNOSIS — J3489 Other specified disorders of nose and nasal sinuses: Secondary | ICD-10-CM | POA: Diagnosis not present

## 2021-01-12 DIAGNOSIS — J343 Hypertrophy of nasal turbinates: Secondary | ICD-10-CM | POA: Diagnosis not present

## 2021-01-12 DIAGNOSIS — H6122 Impacted cerumen, left ear: Secondary | ICD-10-CM | POA: Diagnosis not present

## 2021-01-12 DIAGNOSIS — J342 Deviated nasal septum: Secondary | ICD-10-CM | POA: Diagnosis not present

## 2021-01-12 DIAGNOSIS — Z9889 Other specified postprocedural states: Secondary | ICD-10-CM | POA: Diagnosis not present

## 2021-01-12 DIAGNOSIS — R0981 Nasal congestion: Secondary | ICD-10-CM | POA: Diagnosis not present

## 2021-02-02 DIAGNOSIS — J3489 Other specified disorders of nose and nasal sinuses: Secondary | ICD-10-CM | POA: Diagnosis not present

## 2021-02-02 DIAGNOSIS — J343 Hypertrophy of nasal turbinates: Secondary | ICD-10-CM | POA: Diagnosis not present

## 2021-02-02 DIAGNOSIS — R0981 Nasal congestion: Secondary | ICD-10-CM | POA: Diagnosis not present

## 2021-02-02 DIAGNOSIS — I10 Essential (primary) hypertension: Secondary | ICD-10-CM | POA: Diagnosis not present

## 2021-02-02 DIAGNOSIS — J342 Deviated nasal septum: Secondary | ICD-10-CM | POA: Diagnosis not present

## 2021-02-04 DIAGNOSIS — M79661 Pain in right lower leg: Secondary | ICD-10-CM | POA: Diagnosis not present

## 2021-02-04 DIAGNOSIS — M7989 Other specified soft tissue disorders: Secondary | ICD-10-CM | POA: Diagnosis not present

## 2021-02-04 DIAGNOSIS — I82441 Acute embolism and thrombosis of right tibial vein: Secondary | ICD-10-CM | POA: Diagnosis not present

## 2021-02-10 ENCOUNTER — Encounter: Payer: Self-pay | Admitting: Legal Medicine

## 2021-02-10 ENCOUNTER — Other Ambulatory Visit: Payer: Self-pay

## 2021-02-10 ENCOUNTER — Ambulatory Visit (INDEPENDENT_AMBULATORY_CARE_PROVIDER_SITE_OTHER): Payer: PPO | Admitting: Legal Medicine

## 2021-02-10 DIAGNOSIS — I82401 Acute embolism and thrombosis of unspecified deep veins of right lower extremity: Secondary | ICD-10-CM | POA: Diagnosis not present

## 2021-02-10 DIAGNOSIS — I82409 Acute embolism and thrombosis of unspecified deep veins of unspecified lower extremity: Secondary | ICD-10-CM

## 2021-02-10 HISTORY — DX: Acute embolism and thrombosis of unspecified deep veins of unspecified lower extremity: I82.409

## 2021-02-10 NOTE — Progress Notes (Signed)
Subjective:  Patient ID: Gerald Odom, male    DOB: 1951-10-13  Age: 70 y.o. MRN: [id number]  Chief Complaint  Patient presents with  . Follow-up  . DVT on Right lower leg    HPI: patient noted to have DVT in right leg. He started smoking again., the leg is less pain and he is walking without problems.  Patient is concerned that he may have recurrent DVT will be discussed with treatment time and symptoms to look for for any recurrence.  We should not have to do another Dopplers.  He has put off his nasal surgery the next 3 to 6 months.   Current Outpatient Medications on File Prior to Visit  Medication Sig Dispense Refill  . allopurinol (ZYLOPRIM) 300 MG tablet Take 1 tablet (300 mg total) by mouth daily before breakfast. 90 tablet 1  . Budeson-Glycopyrrol-Formoterol (BREZTRI AEROSPHERE) 160-9-4.8 MCG/ACT AERO Inhale 2 puffs into the lungs 2 (two) times daily. 10.7 g 5  . celecoxib (CELEBREX) 100 MG capsule Take 200 mg by mouth.    Arne Cleveland 5 MG TABS tablet Take 5 mg by mouth 2 (two) times daily.    Marland Kitchen etanercept (ENBREL) 50 MG/ML injection Inject 50 mg into the skin once a week.    . fluticasone (FLONASE) 50 MCG/ACT nasal spray Place 2 sprays into both nostrils daily. 16 g 1  . folic acid (FOLVITE) 1 MG tablet Take by mouth.    Marland Kitchen lisinopril (ZESTRIL) 5 MG tablet Take 1 tablet (5 mg total) by mouth 2 (two) times daily. 180 tablet 1  . methotrexate (RHEUMATREX) 2.5 MG tablet Take 15 mg by mouth once a week.    Marland Kitchen oxyCODONE (OXY IR/ROXICODONE) 5 MG immediate release tablet Take 5 mg by mouth every 6 (six) hours as needed.    . traZODone (DESYREL) 50 MG tablet Take 1 tablet (50 mg total) by mouth at bedtime. 90 tablet 1   No current facility-administered medications on file prior to visit.   Past Medical History:  Diagnosis Date  . Acute cholangitis 05/14/2016  . Arthritis    Rheumatoid arthritis  . Choledocholithiasis 05/16/2016  . Gallstone pancreatitis 05/14/2016  . Gout   . Hearing  loss of left ear due to cerumen impaction 02/09/2020  . Hyperlipidemia   . Hypertension   . Pancreatitis 05/2016   hosp. [facility]  . Pneumonia 05/2016   Past Surgical History:  Procedure Laterality Date  . CHOLECYSTECTOMY    . CHOLECYSTECTOMY N/A 07/04/2016   Procedure: LAPAROSCOPIC CHOLECYSTECTOMY WITH INTRAOPERATIVE CHOLANGIOGRAM;  Surgeon: Jackolyn Confer, MD;  Location: [place];  Service: General;  Laterality: N/A;  . ERCP N/A 05/16/2016   Procedure: ENDOSCOPIC RETROGRADE CHOLANGIOPANCREATOGRAPHY (ERCP);  Surgeon: Doran Stabler, MD;  Location: [facility];  Service: Endoscopy;  Laterality: N/A;  . KNEE SURGERY Left 1997    Family History  Problem Relation Age of Onset  . Anxiety disorder Father    Social History   Socioeconomic History  . Marital status: Married    Spouse name: Not on file  . Number of children: Not on file  . Years of education: Not on file  . Highest education level: Not on file  Occupational History  . Not on file  Tobacco Use  . Smoking status: Current Every Day Smoker    Packs/day: 0.25    Years: 45.00    Pack years: 11.25    Types: Cigarettes  . Smokeless tobacco: Former Network engineer and Sexual Activity  .  Alcohol use: No  . Drug use: No  . Sexual activity: Not on file  Other Topics Concern  . Not on file  Social History Narrative  . Not on file   Social Determinants of Health   Financial Resource Strain: Not on file  Food Insecurity: No Food Insecurity  . Worried About Charity fundraiser in the Last Year: Never true  . Ran Out of Food in the Last Year: Never true  Transportation Needs: No Transportation Needs  . Lack of Transportation (Medical): No  . Lack of Transportation (Non-Medical): No  Physical Activity: Not on file  Stress: Not on file  Social Connections: Not on file    Review of Systems  Constitutional: Negative for activity change and appetite change.  HENT: Negative for congestion and sinus pain.   Eyes: Negative  for visual disturbance.  Respiratory: Negative for chest tightness and shortness of breath.   Cardiovascular: Negative for chest pain, palpitations and leg swelling.  Gastrointestinal: Negative for abdominal distention and abdominal pain.  Endocrine: Negative for polyuria.  Genitourinary: Negative for difficulty urinating, dysuria and urgency.  Musculoskeletal: Negative for arthralgias and back pain.  Neurological: Negative.   Psychiatric/Behavioral: Negative.      Objective:  Temp (!) 97.5 F (36.4 C)   Ht 5\' 7"  (1.702 m)   Wt 160 lb (72.6 kg)   BMI 25.06 kg/m   BP/Weight 02/10/2021 12/01/2020 42/59/5638  Systolic BP - 756 -  Diastolic BP - 78 -  Wt. (Lbs) 160 164.4 167  BMI 25.06 25 25.39    Physical Exam Vitals reviewed.  Constitutional:      Appearance: Normal appearance.  HENT:     Head: Normocephalic and atraumatic.     Right Ear: Tympanic membrane, ear canal and external ear normal.     Left Ear: Tympanic membrane, ear canal and external ear normal.  Eyes:     Extraocular Movements: Extraocular movements intact.     Conjunctiva/sclera: Conjunctivae normal.     Pupils: Pupils are equal, round, and reactive to light.  Cardiovascular:     Rate and Rhythm: Normal rate and regular rhythm.     Pulses: Normal pulses.     Heart sounds: Normal heart sounds. No murmur heard. No gallop.   Pulmonary:     Effort: Pulmonary effort is normal. No respiratory distress.     Breath sounds: Normal breath sounds. No rales.  Abdominal:     General: Abdomen is flat. Bowel sounds are normal.     Palpations: Abdomen is soft.  Musculoskeletal:        General: Normal range of motion.     Cervical back: Normal range of motion and neck supple.     Comments: The right leg is non tender and negative [place]  Skin:    General: Skin is warm.     Capillary Refill: Capillary refill takes less than 2 seconds.  Neurological:     General: No focal deficit present.     Mental Status: He is  alert.       Lab Results  Component Value Date   WBC 9.5 12/01/2020   HGB 16.4 12/01/2020   HCT 47.6 12/01/2020   PLT 248 12/01/2020   GLUCOSE 87 12/01/2020   CHOL 140 01/30/2020   TRIG 121 01/30/2020   HDL 40 01/30/2020   LDLCALC 78 01/30/2020   ALT 12 12/01/2020   AST 14 12/01/2020   NA 140 12/01/2020   K 4.2 12/01/2020   CL  106 12/01/2020   CREATININE 1.13 12/01/2020   BUN 13 12/01/2020   CO2 19 (L) 12/01/2020   TSH 1.880 01/30/2020   INR 0.97 05/14/2016      Assessment & Plan:   Diagnoses and all orders for this visit: Acute deep vein thrombosis (DVT) of right lower extremity, unspecified vein ([place]) Small patient was found to have deep venous thrombosis in the right lower extremity while in the emergency room he was started on Eliquis and is doing well on this his right calf is no longer tender and he can walk without pain.  We will follow-up in 3 months.       Follow-up: Return in about 3 months (around 05/12/2021) for dvt.  An After Visit Summary was printed and given to the patient.  Reinaldo Meeker, MD [facility] [phone]

## 2021-02-15 ENCOUNTER — Encounter: Payer: Self-pay | Admitting: Legal Medicine

## 2021-02-15 ENCOUNTER — Telehealth (INDEPENDENT_AMBULATORY_CARE_PROVIDER_SITE_OTHER): Payer: PPO | Admitting: Legal Medicine

## 2021-02-15 VITALS — Ht 67.0 in | Wt 155.0 lb

## 2021-02-15 DIAGNOSIS — J018 Other acute sinusitis: Secondary | ICD-10-CM | POA: Diagnosis not present

## 2021-02-15 MED ORDER — AMOXICILLIN 875 MG PO TABS: 875.0000 mg | ORAL_TABLET | Freq: Two times a day (BID) | ORAL | 0 refills | Status: AC

## 2021-02-15 MED ORDER — PREDNISONE 10 MG (21) PO TBPK: ORAL_TABLET | ORAL | 0 refills | Status: DC

## 2021-02-15 MED ORDER — CHERATUSSIN AC 100-10 MG/5ML PO SOLN: 5.0000 mL | Freq: Three times a day (TID) | ORAL | 0 refills | Status: DC | PRN

## 2021-02-15 NOTE — Progress Notes (Signed)
Virtual Visit via Telephone Note   This visit type was conducted due to national recommendations for restrictions regarding the COVID-19 Pandemic (e.g. social distancing) in an effort to limit this patient's exposure and mitigate transmission in our community.  Due to his co-morbid illnesses, this patient is at least at moderate risk for complications without adequate follow up.  This format is felt to be most appropriate for this patient at this time.  The patient did not have access to video technology/had technical difficulties with video requiring transitioning to audio format only (telephone).  All issues noted in this document were discussed and addressed.  No physical exam could be performed with this format.  Patient verbally consented to a telehealth visit.   Date:  02/15/2021   ID:  Gerald Odom, DOB [birth year], MRN [id number]  Patient Location: Home Provider Location: Office/Clinic  PCP:  [name], MD   Evaluation Performed:  New Patient Evaluation  Chief Complaint:  Allergy and pollen 2 weeks  History of Present Illness:    Gerald Odom is a 70 y.o. male with Allergy and pollen 2 weeks  The patient does not have symptoms concerning for COVID-19 infection (fever, chills, cough, or new shortness of breath).    Past Medical History:  Diagnosis Date  . Acute cholangitis 05/14/2016  . Arthritis    Rheumatoid arthritis  . Choledocholithiasis 05/16/2016  . Gallstone pancreatitis 05/14/2016  . Gout   . Hearing loss of left ear due to cerumen impaction 02/09/2020  . Hyperlipidemia   . Hypertension   . Pancreatitis 05/2016   hosp. [facility]  . Pneumonia 05/2016    Past Surgical History:  Procedure Laterality Date  . CHOLECYSTECTOMY    . CHOLECYSTECTOMY N/A 07/04/2016   Procedure: LAPAROSCOPIC CHOLECYSTECTOMY WITH INTRAOPERATIVE CHOLANGIOGRAM;  Surgeon: Jackolyn Confer, MD;  Location: [place];  Service: General;  Laterality: N/A;  . ERCP N/A 05/16/2016   Procedure:  ENDOSCOPIC RETROGRADE CHOLANGIOPANCREATOGRAPHY (ERCP);  Surgeon: Doran Stabler, MD;  Location: [facility];  Service: Endoscopy;  Laterality: N/A;  . KNEE SURGERY Left 1997    Family History  Problem Relation Age of Onset  . Anxiety disorder Father     Social History   Socioeconomic History  . Marital status: Married    Spouse name: Not on file  . Number of children: Not on file  . Years of education: Not on file  . Highest education level: Not on file  Occupational History  . Not on file  Tobacco Use  . Smoking status: Current Every Day Smoker    Packs/day: 0.25    Years: 45.00    Pack years: 11.25    Types: Cigarettes  . Smokeless tobacco: Former Network engineer and Sexual Activity  . Alcohol use: No  . Drug use: No  . Sexual activity: Not on file  Other Topics Concern  . Not on file  Social History Narrative  . Not on file   Social Determinants of Health   Financial Resource Strain: Not on file  Food Insecurity: No Food Insecurity  . Worried About Charity fundraiser in the Last Year: Never true  . Ran Out of Food in the Last Year: Never true  Transportation Needs: No Transportation Needs  . Lack of Transportation (Medical): No  . Lack of Transportation (Non-Medical): No  Physical Activity: Not on file  Stress: Not on file  Social Connections: Not on file  Intimate Partner Violence: Not on file  Outpatient Medications Prior to Visit  Medication Sig Dispense Refill  . allopurinol (ZYLOPRIM) 300 MG tablet Take 1 tablet (300 mg total) by mouth daily before breakfast. 90 tablet 1  . Budeson-Glycopyrrol-Formoterol (BREZTRI AEROSPHERE) 160-9-4.8 MCG/ACT AERO Inhale 2 puffs into the lungs 2 (two) times daily. 10.7 g 5  . celecoxib (CELEBREX) 100 MG capsule Take 200 mg by mouth.    Arne Cleveland 5 MG TABS tablet Take 5 mg by mouth 2 (two) times daily.    Marland Kitchen etanercept (ENBREL) 50 MG/ML injection Inject 50 mg into the skin once a week.    . fluticasone (FLONASE)  50 MCG/ACT nasal spray Place 2 sprays into both nostrils daily. 16 g 1  . folic acid (FOLVITE) 1 MG tablet Take by mouth.    Marland Kitchen lisinopril (ZESTRIL) 5 MG tablet Take 1 tablet (5 mg total) by mouth 2 (two) times daily. 180 tablet 1  . methotrexate (RHEUMATREX) 2.5 MG tablet Take 15 mg by mouth once a week.    Marland Kitchen oxyCODONE (OXY IR/ROXICODONE) 5 MG immediate release tablet Take 5 mg by mouth every 6 (six) hours as needed.    . traZODone (DESYREL) 50 MG tablet Take 1 tablet (50 mg total) by mouth at bedtime. 90 tablet 1   No facility-administered medications prior to visit.    Allergies:   Patient has no known allergies.   Social History   Tobacco Use  . Smoking status: Current Every Day Smoker    Packs/day: 0.25    Years: 45.00    Pack years: 11.25    Types: Cigarettes  . Smokeless tobacco: Former Network engineer Use Topics  . Alcohol use: No  . Drug use: No     Review of Systems  Constitutional: Negative for chills and fever.  HENT: Positive for congestion and sinus pain.   Respiratory: Positive for cough.   Cardiovascular: Negative for chest pain, palpitations and claudication.  Gastrointestinal: Negative for heartburn.  Genitourinary: Negative for dysuria.  Musculoskeletal: Negative for myalgias.     Labs/Other Tests and Data Reviewed:    Recent Labs: 12/01/2020: ALT 12; BUN 13; Creatinine, Ser 1.13; Hemoglobin 16.4; Platelets 248; Potassium 4.2; Sodium 140   Recent Lipid Panel Lab Results  Component Value Date/Time   CHOL 140 01/30/2020 09:23 AM   TRIG 121 01/30/2020 09:23 AM   HDL 40 01/30/2020 09:23 AM   CHOLHDL 3.5 01/30/2020 09:23 AM   LDLCALC 78 01/30/2020 09:23 AM    Wt Readings from Last 3 Encounters:  02/15/21 155 lb (70.3 kg)  02/10/21 160 lb (72.6 kg)  12/01/20 164 lb 6.4 oz (74.6 kg)     Objective:    Vital Signs:  Ht 5\' 7"  (1.702 m)   Wt 155 lb (70.3 kg)   BMI 24.28 kg/m    Physical Exam vs reviewed  ASSESSMENT & PLAN:   Diagnoses and all  orders for this visit: Acute non-recurrent sinusitis of other sinus -     amoxicillin (AMOXIL) 875 MG tablet; Take 1 tablet (875 mg total) by mouth 2 (two) times daily for 10 days. -     predniSONE (STERAPRED UNI-PAK 21 TAB) 10 MG (21) TBPK tablet; Take 6ills first day , then 5 pills day 2 and then cut down one pill day until gone -     guaiFENesin-codeine (CHERATUSSIN AC) 100-10 MG/5ML syrup; Take 5 mLs by mouth 3 (three) times daily as needed. Patient's sinusitis and allergies will be treated with amoxicillin and prednisone, with cough medicine  COVID-19 Education: The signs and symptoms of COVID-19 were discussed with the patient and how to seek care for testing (follow up with PCP or arrange E-visit). The importance of social distancing was discussed today.   I spent 20 minutes dedicated to the care of this patient on the date of this encounter to include face-to-face time with the patient, as well as:   Follow Up:  In Person prn  Signed,  Reinaldo Meeker, MD  02/15/2021 1:27 PM    [place]

## 2021-02-16 ENCOUNTER — Telehealth: Payer: Self-pay

## 2021-02-16 NOTE — Progress Notes (Signed)
    Chronic Care Management Pharmacy Assistant   Name: Gerald Odom  MRN: [id number] DOB: 01/28/1951  Gerald Odom is an 70 y.o. year old male who presents for his follow-up CCM visit with the clinical pharmacist.  Reason for Encounter: General adherence call   Phx: HTN, DM, RA,   Recent office visits:  02/15/2021: [name], MD (PCP) video visit / Dx: Acute non-recurrent . Rx's added: Amoxicillin 875 mg. Tablet po bid, Guaifenesin-Codeine 100-10 mg/ 97ml oral tid, Pred 10 mg. (21) taper.  02/10/2021: [name], MD (PCP) / Acute DVT to RLE/ Patient has started smoking again. Nasal surgery postponed for 3-6 months. Patient on Eliquis 5 mg. bid  Recent consult visits:  02/08/2021: [name], MD (Rheumatology) / Phx: RA w/ positive rheumatoid factor/ Added:  MTX 2.5 mg.,  6 tablets po q week.   Hospital visits:  ED visit to [facility]: Doppler revealed RLE DVT's 2. Added Eliquis  Medications: Outpatient Encounter Medications as of 02/16/2021  Medication Sig  . allopurinol (ZYLOPRIM) 300 MG tablet Take 1 tablet (300 mg total) by mouth daily before breakfast.  . amoxicillin (AMOXIL) 875 MG tablet Take 1 tablet (875 mg total) by mouth 2 (two) times daily for 10 days.  . Budeson-Glycopyrrol-Formoterol (BREZTRI AEROSPHERE) 160-9-4.8 MCG/ACT AERO Inhale 2 puffs into the lungs 2 (two) times daily.  . celecoxib (CELEBREX) 100 MG capsule Take 200 mg by mouth.  Arne Cleveland 5 MG TABS tablet Take 5 mg by mouth 2 (two) times daily.  Marland Kitchen etanercept (ENBREL) 50 MG/ML injection Inject 50 mg into the skin once a week.  . fluticasone (FLONASE) 50 MCG/ACT nasal spray Place 2 sprays into both nostrils daily.  . folic acid (FOLVITE) 1 MG tablet Take by mouth.  Marland Kitchen guaiFENesin-codeine (CHERATUSSIN AC) 100-10 MG/5ML syrup Take 5 mLs by mouth 3 (three) times daily as needed.  Marland Kitchen lisinopril (ZESTRIL) 5 MG tablet Take 1 tablet (5 mg total) by mouth 2 (two) times daily.  . methotrexate  (RHEUMATREX) 2.5 MG tablet Take 15 mg by mouth once a week.  Marland Kitchen oxyCODONE (OXY IR/ROXICODONE) 5 MG immediate release tablet Take 5 mg by mouth every 6 (six) hours as needed.  . predniSONE (STERAPRED UNI-PAK 21 TAB) 10 MG (21) TBPK tablet Take 6ills first day , then 5 pills day 2 and then cut down one pill day until gone  . traZODone (DESYREL) 50 MG tablet Take 1 tablet (50 mg total) by mouth at bedtime.   No facility-administered encounter medications on file as of 02/16/2021.   Marland Kitchen [place] for general disease state call.     STAR RATING DRUGS:            LFD:                 Days supply:  Lisinopril               02/02/2021   90DS   Patient  denies  concerns or questions for Gerald Odom, Gerald Odom at this time.     Gerald Odom, [place] Clinical Pharmacist Assistant

## 2021-02-18 DIAGNOSIS — M9901 Segmental and somatic dysfunction of cervical region: Secondary | ICD-10-CM | POA: Diagnosis not present

## 2021-02-18 DIAGNOSIS — S336XXA Sprain of sacroiliac joint, initial encounter: Secondary | ICD-10-CM | POA: Diagnosis not present

## 2021-02-18 DIAGNOSIS — M9903 Segmental and somatic dysfunction of lumbar region: Secondary | ICD-10-CM | POA: Diagnosis not present

## 2021-02-18 DIAGNOSIS — M9902 Segmental and somatic dysfunction of thoracic region: Secondary | ICD-10-CM | POA: Diagnosis not present

## 2021-02-18 DIAGNOSIS — M9905 Segmental and somatic dysfunction of pelvic region: Secondary | ICD-10-CM | POA: Diagnosis not present

## 2021-02-18 DIAGNOSIS — M7912 Myalgia of auxiliary muscles, head and neck: Secondary | ICD-10-CM | POA: Diagnosis not present

## 2021-02-18 DIAGNOSIS — M5451 Vertebrogenic low back pain: Secondary | ICD-10-CM | POA: Diagnosis not present

## 2021-02-21 DIAGNOSIS — J069 Acute upper respiratory infection, unspecified: Secondary | ICD-10-CM | POA: Diagnosis not present

## 2021-02-21 DIAGNOSIS — R051 Acute cough: Secondary | ICD-10-CM | POA: Diagnosis not present

## 2021-02-21 DIAGNOSIS — R0981 Nasal congestion: Secondary | ICD-10-CM | POA: Diagnosis not present

## 2021-02-22 DIAGNOSIS — S336XXA Sprain of sacroiliac joint, initial encounter: Secondary | ICD-10-CM | POA: Diagnosis not present

## 2021-02-22 DIAGNOSIS — M9905 Segmental and somatic dysfunction of pelvic region: Secondary | ICD-10-CM | POA: Diagnosis not present

## 2021-02-22 DIAGNOSIS — M7912 Myalgia of auxiliary muscles, head and neck: Secondary | ICD-10-CM | POA: Diagnosis not present

## 2021-02-22 DIAGNOSIS — M9903 Segmental and somatic dysfunction of lumbar region: Secondary | ICD-10-CM | POA: Diagnosis not present

## 2021-02-22 DIAGNOSIS — M5451 Vertebrogenic low back pain: Secondary | ICD-10-CM | POA: Diagnosis not present

## 2021-02-22 DIAGNOSIS — M9902 Segmental and somatic dysfunction of thoracic region: Secondary | ICD-10-CM | POA: Diagnosis not present

## 2021-02-22 DIAGNOSIS — M9901 Segmental and somatic dysfunction of cervical region: Secondary | ICD-10-CM | POA: Diagnosis not present

## 2021-03-02 ENCOUNTER — Other Ambulatory Visit: Payer: Self-pay

## 2021-03-02 MED ORDER — ELIQUIS 5 MG PO TABS: 5.0000 mg | ORAL_TABLET | Freq: Two times a day (BID) | ORAL | 3 refills | Status: DC

## 2021-03-10 ENCOUNTER — Encounter: Payer: Self-pay | Admitting: Family Medicine

## 2021-03-10 ENCOUNTER — Ambulatory Visit (INDEPENDENT_AMBULATORY_CARE_PROVIDER_SITE_OTHER): Payer: PPO | Admitting: Family Medicine

## 2021-03-10 ENCOUNTER — Other Ambulatory Visit: Payer: Self-pay

## 2021-03-10 VITALS — BP 118/76 | HR 91 | Temp 95.1°F | Ht 67.0 in | Wt 156.8 lb

## 2021-03-10 DIAGNOSIS — F17219 Nicotine dependence, cigarettes, with unspecified nicotine-induced disorders: Secondary | ICD-10-CM | POA: Diagnosis not present

## 2021-03-10 DIAGNOSIS — Z0000 Encounter for general adult medical examination without abnormal findings: Secondary | ICD-10-CM

## 2021-03-10 DIAGNOSIS — Z23 Encounter for immunization: Secondary | ICD-10-CM

## 2021-03-10 DIAGNOSIS — Z716 Tobacco abuse counseling: Secondary | ICD-10-CM

## 2021-03-10 NOTE — Progress Notes (Signed)
Subjective:  Patient ID: Gerald Odom, male    DOB: 08/15/1951  Age: 70 y.o. MRN: [id number]  Chief Complaint  Patient presents with  . Annual wellness    HPI  Well Adult Physical: Patient here for a comprehensive physical exam.The patient reports no problems Do you take any herbs or supplements that were not prescribed by a doctor? no Are you taking calcium supplements? no Are you taking aspirin daily? no  Encounter for general adult medical examination without abnormal findings  Physical ("At Risk" items are starred): Patient's last physical exam was 1 year ago .  Smoking: Light smoker; 2-4 cigarettes a day.  Physical Activity: Exercises at least 3 times per week ; Eat healthy. Alcohol/Drug Use: Is a non-drinker ; No illicit drug use ;  Patient is not afflicted from Stress Incontinence and Urge Incontinence  Safety: reviewed ; Patient wears a seat belt, has smoke detectors, has carbon monoxide detectors, practices appropriate gun safety, and does not wear sunscreen with extended sun exposure. Dental Care: biannual cleanings, brushes and flosses daily. Ophthalmology/Optometry: Annual visit.  Hearing loss: none Vision impairments: Glasses  Functional Status Survey: Is the patient deaf or have difficulty hearing?: No Does the patient have difficulty seeing, even when wearing glasses/contacts?: No Does the patient have difficulty concentrating, remembering, or making decisions?: No Does the patient have difficulty walking or climbing stairs?: No Does the patient have difficulty dressing or bathing?: No Does the patient have difficulty doing errands alone such as visiting a doctor's office or shopping?: No  [place] Office Visit from 03/10/2021 in [place]  PHQ-2 Total Score 0      Current Exercise Habits: Home exercise routine, Type of exercise: walking, Frequency (Times/Week): 7       Social History   Socioeconomic History  . Marital status: Married     Spouse name: Not on file  . Number of children: Not on file  . Years of education: Not on file  . Highest education level: Not on file  Occupational History  . Not on file  Tobacco Use  . Smoking status: Current Every Day Smoker    Packs/day: 0.25    Years: 45.00    Pack years: 11.25    Types: Cigarettes  . Smokeless tobacco: Former Network engineer and Sexual Activity  . Alcohol use: No  . Drug use: No  . Sexual activity: Not on file  Other Topics Concern  . Not on file  Social History Narrative  . Not on file   Social Determinants of Health   Financial Resource Strain: Not on file  Food Insecurity: No Food Insecurity  . Worried About Charity fundraiser in the Last Year: Never true  . Ran Out of Food in the Last Year: Never true  Transportation Needs: No Transportation Needs  . Lack of Transportation (Medical): No  . Lack of Transportation (Non-Medical): No  Physical Activity: Not on file  Stress: Not on file  Social Connections: Not on file   Past Medical History:  Diagnosis Date  . Acute cholangitis 05/14/2016  . Arthritis    Rheumatoid arthritis  . Choledocholithiasis 05/16/2016  . Gallstone pancreatitis 05/14/2016  . Gout   . Hearing loss of left ear due to cerumen impaction 02/09/2020  . Hyperlipidemia   . Hypertension   . Pancreatitis 05/2016   [facility]osp. [facility]  . Pneumonia 05/2016   Past Surgical History:  Procedure Laterality Date  . CHOLECYSTECTOMY    . CHOLECYSTECTOMY  N/A 07/04/2016   Procedure: LAPAROSCOPIC CHOLECYSTECTOMY WITH INTRAOPERATIVE CHOLANGIOGRAM;  Surgeon: Jackolyn Confer, MD;  Location: [facility];  Service: General;  Laterality: N/A;  . ERCP N/A 05/16/2016   Procedure: ENDOSCOPIC RETROGRADE CHOLANGIOPANCREATOGRAPHY (ERCP);  Surgeon: Doran Stabler, MD;  Location: [facility];  Service: Endoscopy;  Laterality: N/A;  . KNEE SURGERY Left 1997    Family History  Problem Relation Age of Onset  . Anxiety disorder Father    Social History    Socioeconomic History  . Marital status: Married    Spouse name: Not on file  . Number of children: Not on file  . Years of education: Not on file  . Highest education level: Not on file  Occupational History  . Not on file  Tobacco Use  . Smoking status: Current Every Day Smoker    Packs/day: 0.25    Years: 45.00    Pack years: 11.25    Types: Cigarettes  . Smokeless tobacco: Former Network engineer and Sexual Activity  . Alcohol use: No  . Drug use: No  . Sexual activity: Not on file  Other Topics Concern  . Not on file  Social History Narrative  . Not on file   Social Determinants of Health   Financial Resource Strain: Not on file  Food Insecurity: No Food Insecurity  . Worried About Charity fundraiser in the Last Year: Never true  . Ran Out of Food in the Last Year: Never true  Transportation Needs: No Transportation Needs  . Lack of Transportation (Medical): No  . Lack of Transportation (Non-Medical): No  Physical Activity: Not on file  Stress: Not on file  Social Connections: Not on file   Review of Systems  Constitutional: Negative for chills, diaphoresis, fatigue and fever.  HENT: Negative for congestion, ear pain and sore throat.   Respiratory: Negative for cough and shortness of breath.   Cardiovascular: Negative for chest pain and leg swelling.  Gastrointestinal: Negative for abdominal pain, constipation, diarrhea, nausea and vomiting.  Genitourinary: Negative for dysuria and urgency.  Musculoskeletal: Negative for arthralgias and myalgias.  Neurological: Negative for dizziness and headaches.  Psychiatric/Behavioral: Negative for dysphoric mood.     Objective:  BP 118/76   Pulse 91   Temp (!) 95.1 F (35.1 C)   Ht 5\' 7"  (1.702 m)   Wt 156 lb 12.8 oz (71.1 kg)   SpO2 91%   BMI 24.56 kg/m   BP/Weight 03/10/2021 02/15/2021 5/85/2778  Systolic BP 242 - -  Diastolic BP 76 - -  Wt. (Lbs) 156.8 155 160  BMI 24.56 24.28 25.06    Physical  Exam Vitals reviewed.  Constitutional:      Appearance: Normal appearance. He is normal weight.  Cardiovascular:     Rate and Rhythm: Normal rate and regular rhythm.     Heart sounds: Normal heart sounds. No murmur heard.   Pulmonary:     Effort: Pulmonary effort is normal.     Breath sounds: Normal breath sounds.  Neurological:     Mental Status: He is alert and oriented to person, place, and time.  Psychiatric:        Mood and Affect: Mood normal.        Behavior: Behavior normal.     Lab Results  Component Value Date   WBC 9.5 12/01/2020   HGB 16.4 12/01/2020   HCT 47.6 12/01/2020   PLT 248 12/01/2020   GLUCOSE 87 12/01/2020   CHOL 140 01/30/2020  TRIG 121 01/30/2020   HDL 40 01/30/2020   LDLCALC 78 01/30/2020   ALT 12 12/01/2020   AST 14 12/01/2020   NA 140 12/01/2020   K 4.2 12/01/2020   CL 106 12/01/2020   CREATININE 1.13 12/01/2020   BUN 13 12/01/2020   CO2 19 (L) 12/01/2020   TSH 1.880 01/30/2020   INR 0.97 05/14/2016      Assessment & Plan:  1. Well adult exam Healthy male. Recommended use sunscreen and quit smoking.  Also complete HCPOA.   2. Need for 23-polyvalent pneumococcal polysaccharide vaccine - Pneumococcal polysaccharide vaccine 23-valent greater than or equal to 2yo subcutaneous/IM  3. Cigarette nicotine dependence with nicotine-induced disorder Recommend quit smoking. Recently restarted and only smoking 2-4 cigarettes per day. No medicine should be needed.   4. Encounter for smoking cessation counseling  see above.   Body mass index is 24.56 kg/m.   These are the goals we    This is a list of the screening recommended for you and due dates:  Health Maintenance  Topic Date Due  . Hepatitis C Screening: USPSTF Recommendation to screen - Ages 96-79 yo.  Never done  . COVID-19 Vaccine (4 - Booster for Pfizer series) 03/19/2021  . Flu Shot  05/17/2021  . Colon Cancer Screening  12/29/2026  . Tetanus Vaccine  01/01/2029  .  Pneumonia vaccines  Completed  . HPV Vaccine  Aged Out     AN INDIVIDUALIZED CARE PLAN: was established or reinforced today.   SELF MANAGEMENT: The patient and I together assessed ways to personally work towards obtaining the recommended goals  Support needs The patient and/or family needs were assessed and services were offered if appropriate.   Follow-up: Return in about 6 months (around 09/10/2021) for routine follow up. .  An After Visit Summary was printed and given to the patient.  [name], MD [facility] [phone]

## 2021-03-10 NOTE — Patient Instructions (Addendum)
Recommend quit smoking.  Pneumovax 23 given today.  Complete Health care power of attorney papers/living well.   Preventive Care 72 Years and Older, Male Preventive care refers to lifestyle choices and visits with your health care provider that can promote health and wellness. This includes:  A yearly physical exam. This is also called an annual wellness visit.  Regular dental and eye exams.  Immunizations.  Screening for certain conditions.  Healthy lifestyle choices, such as: ? Eating a healthy diet. ? Getting regular exercise. ? Not using drugs or products that contain nicotine and tobacco. ? Limiting alcohol use. What can I expect for my preventive care visit? Physical exam Your health care provider will check your:  Height and weight. These may be used to calculate your BMI (body mass index). BMI is a measurement that tells if you are at a healthy weight.  Heart rate and blood pressure.  Body temperature.  Skin for abnormal spots. Counseling Your health care provider may ask you questions about your:  Past medical problems.  Family's medical history.  Alcohol, tobacco, and drug use.  Emotional well-being.  Home life and relationship well-being.  Sexual activity.  Diet, exercise, and sleep habits.  History of falls.  Memory and ability to understand (cognition).  Work and work Statistician.  Access to firearms. What immunizations do I need? Vaccines are usually given at various ages, according to a schedule. Your health care provider will recommend vaccines for you based on your age, medical history, and lifestyle or other factors, such as travel or where you work.   What tests do I need? Blood tests  Lipid and cholesterol levels. These may be checked every 5 years, or more often depending on your overall health.  Hepatitis C test.  Hepatitis B test. Screening  Lung cancer screening. You may have this screening every year starting at age 80 if you  have a 30-pack-year history of smoking and currently smoke or have quit within the past 15 years.  Colorectal cancer screening. ? All adults should have this screening starting at age 35 and continuing until age 65. ? Your health care provider may recommend screening at age 50 if you are at increased risk. ? You will have tests every 1-10 years, depending on your results and the type of screening test.  Prostate cancer screening. Recommendations will vary depending on your family history and other risks.  Genital exam to check for testicular cancer or hernias.  Diabetes screening. ? This is done by checking your blood sugar (glucose) after you have not eaten for a while (fasting). ? You may have this done every 1-3 years.  Abdominal aortic aneurysm (AAA) screening. You may need this if you are a current or former smoker.  STD (sexually transmitted disease) testing, if you are at risk. Follow these instructions at home: Eating and drinking  Eat a diet that includes fresh fruits and vegetables, whole grains, lean protein, and low-fat dairy products. Limit your intake of foods with high amounts of sugar, saturated fats, and salt.  Take vitamin and mineral supplements as recommended by your health care provider.  Do not drink alcohol if your health care provider tells you not to drink.  If you drink alcohol: ? Limit how much you have to 0-2 drinks a day. ? Be aware of how much alcohol is in your drink. In the [place] one drink equals one 12 oz bottle of beer (355 mL), one 5 oz glass of wine (148 mL), or  one 1 oz glass of hard liquor (44 mL).   Lifestyle  Take daily care of your teeth and gums. Brush your teeth every morning and night with fluoride toothpaste. Floss one time each day.  Stay active. Exercise for at least 30 minutes 5 or more days each week.  Do not use any products that contain nicotine or tobacco, such as cigarettes, e-cigarettes, and chewing tobacco. If you need help  quitting, ask your health care provider.  Do not use drugs.  If you are sexually active, practice safe sex. Use a condom or other form of protection to prevent STIs (sexually transmitted infections).  Talk with your health care provider about taking a low-dose aspirin or statin.  Find healthy ways to cope with stress, such as: ? Meditation, yoga, or listening to music. ? Journaling. ? Talking to a trusted person. ? Spending time with friends and family. Safety  Always wear your seat belt while driving or riding in a vehicle.  Do not drive: ? If you have been drinking alcohol. Do not ride with someone who has been drinking. ? When you are tired or distracted. ? While texting.  Wear a helmet and other protective equipment during sports activities.  If you have firearms in your house, make sure you follow all gun safety procedures. What's next?  Visit your health care provider once a year for an annual wellness visit.  Ask your health care provider how often you should have your eyes and teeth checked.  Stay up to date on all vaccines. This information is not intended to replace advice given to you by your health care provider. Make sure you discuss any questions you have with your health care provider. Document Revised: 07/02/2019 Document Reviewed: 09/27/2018 Elsevier Patient Education  2021 [employer].

## 2021-04-07 DIAGNOSIS — Z79899 Other long term (current) drug therapy: Secondary | ICD-10-CM | POA: Diagnosis not present

## 2021-04-07 DIAGNOSIS — M0579 Rheumatoid arthritis with rheumatoid factor of multiple sites without organ or systems involvement: Secondary | ICD-10-CM | POA: Diagnosis not present

## 2021-04-07 DIAGNOSIS — M1A09X0 Idiopathic chronic gout, multiple sites, without tophus (tophi): Secondary | ICD-10-CM | POA: Diagnosis not present

## 2021-04-28 ENCOUNTER — Ambulatory Visit (INDEPENDENT_AMBULATORY_CARE_PROVIDER_SITE_OTHER): Payer: PPO

## 2021-04-28 DIAGNOSIS — I10 Essential (primary) hypertension: Secondary | ICD-10-CM | POA: Diagnosis not present

## 2021-04-28 DIAGNOSIS — J418 Mixed simple and mucopurulent chronic bronchitis: Secondary | ICD-10-CM

## 2021-04-28 NOTE — Progress Notes (Signed)
Chronic Care Management Pharmacy Note  05/04/2021 Name:  Gerald Odom MRN:  [id number] DOB:  1951/03/05  Summary: [name] He is questioning if a follow-up scan will be needed. .  Patient has resumed smoking since hospitalization. He states he will work to cut back and stop again.    Subjective: Gerald Odom is an 70 y.o. year old male who is a primary patient of Cox, Kirsten, MD.  The CCM team was consulted for assistance with disease management and care coordination needs.    Engaged with patient by telephone for follow up visit in response to provider referral for pharmacy case management and/or care coordination services.   Consent to Services:  The patient was given information about Chronic Care Management services, agreed to services, and gave verbal consent prior to initiation of services.  Please see initial visit note for detailed documentation.   Patient Care Team: [name], MD as PCP - General (Family Medicine) Burnice Logan, [facility] as Pharmacist (Pharmacist) Quintin Alto, MD as Consulting Physician (Rheumatology)  Recent office visits: 03/10/2021 - Pneumovax 23. Recommend quit smoking.   02/15/2021 - amoxicillin, prednisone and cheratussin ac for sinusitis.  02/10/2021 - patient started on eliquis at hospital for dvt. Follow-up in 3 months.  Recent consult visits: None since last ccm visit   04/07/2021 - rheumatology - continue methotrexate, folic acid and Enbrel for RA. Has [employer] assistance. Continue Celebrex and prednisone prn. Continue allopurinol.  Hospital visits: 02/04/2021 - ED visit for pain in right lower leg - DVT.  Objective:  Lab Results  Component Value Date   CREATININE 1.13 12/01/2020   BUN 13 12/01/2020   GFRNONAA 65 12/01/2020   GFRAA 75 12/01/2020   NA 140 12/01/2020   K 4.2 12/01/2020    CALCIUM 9.3 12/01/2020   CO2 19 (L) 12/01/2020   GLUCOSE 87 12/01/2020    No results found for: HGBA1C, FRUCTOSAMINE, GFR, MICROALBUR  Last diabetic Eye exam: No results found for: HMDIABEYEEXA  Last diabetic Foot exam: No results found for: HMDIABFOOTEX   Lab Results  Component Value Date   CHOL 140 01/30/2020   HDL 40 01/30/2020   LDLCALC 78 01/30/2020   TRIG 121 01/30/2020   CHOLHDL 3.5 01/30/2020    Hepatic Function Latest Ref Rng & Units 12/01/2020 07/05/2016 07/05/2016  Total Protein 6.0 - 8.5 g/dL 6.8 6.8 6.6  Albumin 3.7 - 4.7 g/dL 4.1 3.7 3.9  AST 0 - 40 IU/L 14 39 40  ALT 0 - 44 IU/L 12 49 50  Alk Phosphatase 44 - 121 IU/L 90 69 73  Total Bilirubin 0.0 - 1.2 mg/dL 0.4 0.8 0.8    Lab Results  Component Value Date/Time   TSH 1.880 01/30/2020 09:23 AM    CBC Latest Ref Rng & Units 12/01/2020 07/05/2016 07/05/2016  WBC 3.4 - 10.8 x10E3/uL 9.5 18.4(H) 17.2(H)  Hemoglobin 13.0 - 17.7 g/dL 16.4 14.2 14.6  Hematocrit 37.5 - 51.0 % 47.6 42.0 44.3  Platelets 150 - 450 x10E3/uL 248 243 262    No results found for: VD25OH  Clinical ASCVD: No  The 10-year ASCVD risk score Mikey Bussing DC Jr., et al., 2013) is: 26.9%   Values used to calculate the score:     Age: 70 years     Sex: Male     Is Non-Hispanic African American: No  Diabetic: No     Tobacco smoker: Yes     Systolic Blood Pressure: 132 mmHg     Is BP treated: Yes     HDL Cholesterol: 40 mg/dL     Total Cholesterol: 140 mg/dL    Depression screen PHQ 2/9 03/10/2021 02/10/2021 01/30/2020  Decreased Interest 0 0 0  Down, Depressed, Hopeless 0 0 0  PHQ - 2 Score 0 0 0     Other: (CHADS2VASc if Afib, MMRC or CAT for COPD, ACT, DEXA)  Social History   Tobacco Use  Smoking Status Every Day   Packs/day: 0.25   Years: 45.00   Pack years: 11.25   Types: Cigarettes  Smokeless Tobacco Former   BP Readings from Last 3 Encounters:  03/10/21 118/76  12/01/20 128/78  08/27/20 128/80   Pulse Readings from Last 3  Encounters:  03/10/21 91  12/01/20 80  10/13/20 78   Wt Readings from Last 3 Encounters:  03/10/21 156 lb 12.8 oz (71.1 kg)  02/15/21 155 lb (70.3 kg)  02/10/21 160 lb (72.6 kg)   BMI Readings from Last 3 Encounters:  03/10/21 24.56 kg/m  02/15/21 24.28 kg/m  02/10/21 25.06 kg/m    Assessment/Interventions: Review of patient past medical history, allergies, medications, health status, including review of consultants reports, laboratory and other test data, was performed as part of comprehensive evaluation and provision of chronic care management services.   SDOH:  (Social Determinants of Health) assessments and interventions performed: Yes  SDOH Screenings   Alcohol Screen: Low Risk    Last Alcohol Screening Score (AUDIT): 1  Depression (PHQ2-9): Low Risk    PHQ-2 Score: 0  Financial Resource Strain: Not on file  Food Insecurity: No Food Insecurity   Worried About Running Out of Food in the Last Year: Never true   Ran Out of Food in the Last Year: Never true  Housing: Not on file  Physical Activity: Not on file  Social Connections: Not on file  Stress: Not on file  Tobacco Use: High Risk   Smoking Tobacco Use: Every Day   Smokeless Tobacco Use: Former  Transportation Needs: No Transportation Needs   Lack of Transportation (Medical): No   Lack of Transportation (Non-Medical): No    CCM Care Plan  No Known Allergies  Medications Reviewed Today     Reviewed by Brown, Sarah B, RPH (Pharmacist) on 04/30/21 at 0854  Med List Status: <None>   Medication Order Taking? Sig Documenting Provider Last Dose Status Informant  allopurinol (ZYLOPRIM) 300 MG tablet [id number] Yes Take 1 tablet (300 mg total) by mouth daily before breakfast. Cox, Kirsten, MD Taking Active   Budeson-Glycopyrrol-Formoterol (BREZTRI AEROSPHERE) 160-9-4.8 MCG/ACT AERO [id number] Yes Inhale 2 puffs into the lungs 2 (two) times daily. Cox, Kirsten, MD Taking Active   celecoxib (CELEBREX) 100 MG capsule  [id number] No Take 200 mg by mouth.  Patient not taking: Reported on 04/28/2021   [provider] Not Taking Active   ELIQUIS 5 MG TABS tablet [id number] Yes Take 1 tablet (5 mg total) by mouth 2 (two) times daily. Cox, Kirsten, MD Taking Active   etanercept (ENBREL) 50 MG/ML injection [id number] Yes Inject 50 mg into the skin once a week. [provider] Taking Active   fluticasone (FLONASE) 50 MCG/ACT nasal spray [id number] Yes Place 2 sprays into both nostrils daily. Heaton, Shannon J, NP Taking Active   folic acid (FOLVITE) 1 MG tablet [id number] Yes Take by mouth. [provider] Taking Active     lisinopril (ZESTRIL) 5 MG tablet [id number] Yes Take 1 tablet (5 mg total) by mouth 2 (two) times daily. Cox, Kirsten, MD Taking Active   methotrexate (RHEUMATREX) 2.5 MG tablet [id number] Yes Take 15 mg by mouth once a week. 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 05/10/2017   Rheumatoid arthritis involving multiple sites with positive rheumatoid factor (HCC) 05/10/2017   Gout 05/14/2016   GERD (gastroesophageal reflux disease) 05/14/2016   Tobacco abuse 07/19/2011    Immunization History  Administered Date(s) Administered   Fluad Quad(high Dose 65+) 07/31/2020   Influenza-Unspecified 06/11/2019   PFIZER(Purple Top)SARS-COV-2 Vaccination 05/16/2020, 06/13/2020, 12/17/2020   Pneumococcal Conjugate-13 09/22/2015   Pneumococcal Polysaccharide-23 09/17/2013, 03/10/2021   Td 01/02/2019   Tdap 10/25/2011    Conditions to be addressed/monitored:  Hypertension, GERD, and Tobacco use  Care Plan : CCM Pharmacy Care Plan  Updates made by Brown, Sarah B, RPH  since 05/04/2021 12:00 AM     Problem: Htn, COPD, smoking cessation   Priority: High  Onset Date: 04/30/2021     Long-Range Goal: Disease State Management   Start Date: 04/30/2021  Expected End Date: 04/30/2022  This Visit's Progress: On track  Priority: High  Note:   Current Barriers:  Unable to achieve control of smoking cessation   Pharmacist Clinical Goal(s):  Patient will achieve control of tobacco use as evidenced by working on smoking cessation through collaboration with PharmD and provider.   Interventions: 1:1 collaboration with Cox, Kirsten, MD regarding development and update of comprehensive plan of care as evidenced by provider attestation and co-signature Inter-disciplinary care team collaboration (see longitudinal plan of care) Comprehensive medication review performed; medication list updated in electronic medical record  Hypertension (BP goal <140/90) -Controlled -Current treatment: Lisinopril 5 mg twice daily  -Medications previously tried: none reported -Current home readings: not checking regularly -Current dietary habits: doesn't follow specific diet. Doesn't add salt to food.  -Current exercise habits: Stays active working in yard.  -Denies hypotensive/hypertensive symptoms -Educated on BP goals and benefits of medications for prevention of heart attack, stroke and kidney damage; Daily salt intake goal < 2300 mg; Exercise goal of 150 minutes per week; -Counseled to monitor BP at home weekly, document, and provide log at future appointments -Counseled on diet and exercise extensively Recommended to continue current medication  COPD (Goal: control symptoms and prevent exacerbations) -Controlled -Current treatment  Breztri 2 puffs twice daily  -Medications previously tried: none repor -MMRC/CAT score: 4 (low) -Pulmonary function testing: not available in chart  -Exacerbations requiring treatment in last 6 months: none reported -Patient reports  consistent use of maintenance inhaler -Frequency of rescue inhaler use: n/a -Counseled on Benefits of consistent maintenance inhaler use -Counseled on option of patient assistance for Breztri if cost becomes too expensive. Patient reports breathing well controlled.    Tobacco use (Goal smoking cessation) -Uncontrolled -Previous quit attempts: yes - abstinence -Current treatment  resumed back smoking some since hospitalization smoking - starts and stops periodically.  -Patient smokes Within 30 minutes of waking -Patient triggers include: stress -On a scale of 1-10, reports MOTIVATION to quit is 2 -On a scale of 1-10, reports CONFIDENCE in quitting is 2 -  Provided contact information for [place] Quit Line (1-800-QUIT-NOW) and encouraged patient to reach out to this group for support. -Counseled on benefits of smoking cessation.    Patient Goals/Self-Care Activities Patient will:  - take medications as prescribed  Follow Up Plan: Telephone follow up appointment with care management team member scheduled for: 04/2022      Medication Assistance:  Enbrel obtained through   medication assistance program.  Enrollment ends 10/16/2021. Coordinated through Rheumatology.   Compliance/Adherence/Medication fill history: Care Gaps: AWV need for 2022  Star-Rating Drugs: Lisinopril  02/02/2021 90 day supply   Patient's preferred pharmacy is:  [facility] - [place], [place] - [address]  [place] [address] Phone: [phone] Fax: [phone]  Uses pill box? No - takes as prescribed Pt endorses good compliance  We discussed: Benefits of medication synchronization, packaging and delivery as well as enhanced pharmacist oversight with [employer]. Patient decided to: Continue current medication management strategy  Care Plan and Follow Up Patient Decision:  Patient agrees to Care Plan and Follow-up.  Plan: Telephone follow up appointment with care management team  member scheduled for:  04/2022  

## 2021-05-04 NOTE — Patient Instructions (Signed)
Visit Information   Goals Addressed             This Visit's Progress    Lifestyle Change-Hypertension   On track    Timeframe:  Long-Range Goal Priority:  High Start Date:                             Expected End Date:                       Follow Up Date 04/2022   - ask questions to understand    Why is this important?   The changes that you are asked to make may be hard to do.  This is especially true when the changes are life-long.  Knowing why it is important to you is the first step.  Working on the change with your family or support person helps you not feel alone.  Reward yourself and family or support person when goals are met. This can be an activity you choose like bowling, hiking, biking, swimming or shooting hoops.     Notes:      Track and Manage My Blood Pressure-Hypertension   On track    Timeframe:  Long-Range Goal Priority:  High Start Date:                             Expected End Date:                       Follow Up Date 04/2022   - check blood pressure weekly    Why is this important?   You won't feel high blood pressure, but it can still hurt your blood vessels.  High blood pressure can cause heart or kidney problems. It can also cause a stroke.  Making lifestyle changes like losing a little weight or eating less salt will help.  Checking your blood pressure at home and at different times of the day can help to control blood pressure.  If the doctor prescribes medicine remember to take it the way the doctor ordered.  Call the office if you cannot afford the medicine or if there are questions about it.     Notes:        Patient Care Plan: CCM Pharmacy Care Plan     Problem Identified: Htn, COPD, smoking cessation   Priority: High  Onset Date: 04/30/2021     Long-Range Goal: Disease State Management   Start Date: 04/30/2021  Expected End Date: 04/30/2022  This Visit's Progress: On track  Priority: High  Note:   Current Barriers:   Unable to achieve control of smoking cessation   Pharmacist Clinical Goal(s):  Patient will achieve control of tobacco use as evidenced by working on smoking cessation through collaboration with PharmD and provider.   Interventions: 1:1 collaboration with Cox, Kirsten, MD regarding development and update of comprehensive plan of care as evidenced by provider attestation and co-signature Inter-disciplinary care team collaboration (see longitudinal plan of care) Comprehensive medication review performed; medication list updated in electronic medical record  Hypertension (BP goal <140/90) -Controlled -Current treatment: Lisinopril 5 mg twice daily  -Medications previously tried: none reported -Current home readings: not checking regularly -Current dietary habits: doesn't follow specific diet. Doesn't add salt to food.  -Current exercise habits: Stays active working in yard.  -Denies hypotensive/hypertensive symptoms -Educated on BP goals   and benefits of medications for prevention of heart attack, stroke and kidney damage; Daily salt intake goal < 2300 mg; Exercise goal of 150 minutes per week; -Counseled to monitor BP at home weekly, document, and provide log at future appointments -Counseled on diet and exercise extensively Recommended to continue current medication  COPD (Goal: control symptoms and prevent exacerbations) -Controlled -Current treatment  Breztri 2 puffs twice daily  -Medications previously tried: none repor -MMRC/CAT score: 4 (low) -Pulmonary function testing: not available in chart  -Exacerbations requiring treatment in last 6 months: none reported -Patient reports consistent use of maintenance inhaler -Frequency of rescue inhaler use: n/a -Counseled on Benefits of consistent maintenance inhaler use -Counseled on option of patient assistance for Breztri if cost becomes too expensive. Patient reports breathing well controlled.    Tobacco use (Goal smoking  cessation) -Uncontrolled -Previous quit attempts: yes - abstinence -Current treatment  resumed back smoking some since hospitalization smoking - starts and stops periodically.  -Patient smokes Within 30 minutes of waking -Patient triggers include: stress -On a scale of 1-10, reports MOTIVATION to quit is 2 -On a scale of 1-10, reports CONFIDENCE in quitting is 2 -Provided contact information for [place] Quit Line (1-800-QUIT-NOW) and encouraged patient to reach out to this group for support. -Counseled on benefits of smoking cessation.    Patient Goals/Self-Care Activities Patient will:  - take medications as prescribed  Follow Up Plan: Telephone follow up appointment with care management team member scheduled for: 04/2022      The patient verbalized understanding of instructions, educational materials, and care plan provided today and declined offer to receive copy of patient instructions, educational materials, and care plan.  Telephone follow up appointment with pharmacy team member scheduled for: 04/2022  Sarah B Brown, RPH  

## 2021-05-11 ENCOUNTER — Other Ambulatory Visit: Payer: Self-pay | Admitting: Family Medicine

## 2021-05-13 ENCOUNTER — Encounter: Payer: Self-pay | Admitting: Legal Medicine

## 2021-05-13 ENCOUNTER — Other Ambulatory Visit: Payer: Self-pay

## 2021-05-13 ENCOUNTER — Ambulatory Visit (INDEPENDENT_AMBULATORY_CARE_PROVIDER_SITE_OTHER): Payer: PPO | Admitting: Legal Medicine

## 2021-05-13 VITALS — BP 124/80 | HR 83 | Temp 97.4°F | Resp 16 | Ht 67.0 in | Wt 160.0 lb

## 2021-05-13 DIAGNOSIS — Z720 Tobacco use: Secondary | ICD-10-CM

## 2021-05-13 DIAGNOSIS — I82401 Acute embolism and thrombosis of unspecified deep veins of right lower extremity: Secondary | ICD-10-CM | POA: Diagnosis not present

## 2021-05-13 NOTE — Progress Notes (Signed)
Established Patient Office Visit  Subjective:  Patient ID: Gerald Odom, male    DOB: [birth year]  Age: 70 y.o. MRN: [id number]  CC:  Chief Complaint  Patient presents with   Deep Vein Thrombosis    HPI [name] presents for DVT.  Diagnosed 02/04/2021 and started on eliquis.  No bleeding.  He still smokes., no DOE he has been on 3 months of eliquis.  No further pain.  Past Medical History:  Diagnosis Date   Acute cholangitis 05/14/2016   Arthritis    Rheumatoid arthritis   Choledocholithiasis 05/16/2016   Gallstone pancreatitis 05/14/2016   Gout    Hearing loss of left ear due to cerumen impaction 02/09/2020   Hyperlipidemia    Hypertension    Pancreatitis 05/2016   hosp. [facility]   Pneumonia 05/2016    Past Surgical History:  Procedure Laterality Date   CHOLECYSTECTOMY     CHOLECYSTECTOMY N/A 07/04/2016   Procedure: LAPAROSCOPIC CHOLECYSTECTOMY WITH INTRAOPERATIVE CHOLANGIOGRAM;  Surgeon: Jackolyn Confer, MD;  Location: [place];  Service: General;  Laterality: N/A;   ERCP N/A 05/16/2016   Procedure: ENDOSCOPIC RETROGRADE CHOLANGIOPANCREATOGRAPHY (ERCP);  Surgeon: Doran Stabler, MD;  Location: [facility];  Service: Endoscopy;  Laterality: N/A;   KNEE SURGERY Left 1997    Family History  Problem Relation Age of Onset   Anxiety disorder Father     Social History   Socioeconomic History   Marital status: Married    Spouse name: Not on file   Number of children: Not on file   Years of education: Not on file   Highest education level: Not on file  Occupational History   Not on file  Tobacco Use   Smoking status: Every Day    Packs/day: 0.50    Years: 45.00    Pack years: 22.50    Types: Cigarettes   Smokeless tobacco: Never  Substance and Sexual Activity   Alcohol use: Yes    Alcohol/week: 1.0 standard drink    Types: 1 Standard drinks or equivalent per week    Comment: ocasionally   Drug use: No   Sexual activity: Yes    Partners: Female  Other  Topics Concern   Not on file  Social History Narrative   Not on file   Social Determinants of Health   Financial Resource Strain: Not on file  Food Insecurity: No Food Insecurity   Worried About Charity fundraiser in the Last Year: Never true   Ran Out of Food in the Last Year: Never true  Transportation Needs: No Transportation Needs   Lack of Transportation (Medical): No   Lack of Transportation (Non-Medical): No  Physical Activity: Not on file  Stress: Not on file  Social Connections: Not on file  Intimate Partner Violence: Not on file    Outpatient Medications Prior to Visit  Medication Sig Dispense Refill   allopurinol (ZYLOPRIM) 300 MG tablet Take 1 tablet (300 mg total) by mouth daily before breakfast. 90 tablet 1   Budeson-Glycopyrrol-Formoterol (BREZTRI AEROSPHERE) 160-9-4.8 MCG/ACT AERO Inhale 2 puffs into the lungs 2 (two) times daily. 10.7 g 5   celecoxib (CELEBREX) 100 MG capsule Take 200 mg by mouth.     ELIQUIS 5 MG TABS tablet Take 1 tablet (5 mg total) by mouth 2 (two) times daily. 60 tablet 3   etanercept (ENBREL) 50 MG/ML injection Inject 50 mg into the skin once a week.     fluticasone (FLONASE) 50 MCG/ACT nasal spray  Place 2 sprays into both nostrils daily. 16 g 1   folic acid (FOLVITE) 1 MG tablet Take by mouth.     lisinopril (ZESTRIL) 5 MG tablet TAKE ONE (1) TABLET BY MOUTH TWO (2) TIMES DAILY 180 tablet 1   methotrexate (RHEUMATREX) 2.5 MG tablet Take 15 mg by mouth once a week.     No facility-administered medications prior to visit.    No Known Allergies  ROS Review of Systems  Constitutional:  Negative for chills, fatigue and fever.  HENT:  Negative for congestion, ear pain and sore throat.   Respiratory:  Negative for cough and shortness of breath.   Cardiovascular:  Negative for chest pain.  Gastrointestinal:  Negative for abdominal pain, constipation, diarrhea, nausea and vomiting.  Endocrine: Negative for polydipsia, polyphagia and  polyuria.  Genitourinary:  Negative for dysuria and frequency.  Musculoskeletal:  Negative for arthralgias and myalgias.  Neurological:  Negative for dizziness and headaches.  Psychiatric/Behavioral:  Negative for dysphoric mood.        No dysphoria     Objective:    Physical Exam Vitals reviewed.  Constitutional:      Appearance: Normal appearance.  HENT:     Head: Normocephalic.     Right Ear: Tympanic membrane normal.     Left Ear: Tympanic membrane normal.     Nose: Nose normal.     Mouth/Throat:     Mouth: Mucous membranes are moist.     Pharynx: Oropharynx is clear.  Eyes:     Extraocular Movements: Extraocular movements intact.     Conjunctiva/sclera: Conjunctivae normal.     Pupils: Pupils are equal, round, and reactive to light.  Cardiovascular:     Rate and Rhythm: Normal rate and regular rhythm.     Pulses: Normal pulses.     Heart sounds: Normal heart sounds. No murmur heard. Pulmonary:     Effort: Pulmonary effort is normal. No respiratory distress.     Breath sounds: No wheezing.  Abdominal:     General: Abdomen is flat. Bowel sounds are normal. There is no distension.     Palpations: Abdomen is soft.     Tenderness: There is no abdominal tenderness.  Musculoskeletal:     Cervical back: Normal range of motion and neck supple.     Right lower leg: No edema.     Left lower leg: No edema.     Comments: Negative howman and no swelling in right eg  Skin:    General: Skin is warm.     Capillary Refill: Capillary refill takes less than 2 seconds.  Neurological:     General: No focal deficit present.     Mental Status: He is alert and oriented to person, place, and time. Mental status is at baseline.  Psychiatric:        Mood and Affect: Mood normal.        Thought Content: Thought content normal.    BP 124/80   Pulse 83   Temp (!) 97.4 F (36.3 C)   Resp 16   Ht '5\' 7"'$  (1.702 m)   Wt 160 lb (72.6 kg)   SpO2 93%   BMI 25.06 kg/m  Wt Readings from  Last 3 Encounters:  05/13/21 160 lb (72.6 kg)  03/10/21 156 lb 12.8 oz (71.1 kg)  02/15/21 155 lb (70.3 kg)     Health Maintenance Due  Topic Date Due   Hepatitis C Screening  Never done   Zoster Vaccines- Shingrix (1  of 2) Never done   COVID-19 Vaccine (4 - Booster for Pfizer series) 03/19/2021    There are no preventive care reminders to display for this patient.  Lab Results  Component Value Date   TSH 1.880 01/30/2020   Lab Results  Component Value Date   WBC 9.5 12/01/2020   HGB 16.4 12/01/2020   HCT 47.6 12/01/2020   MCV 97 12/01/2020   PLT 248 12/01/2020   Lab Results  Component Value Date   NA 140 12/01/2020   K 4.2 12/01/2020   CO2 19 (L) 12/01/2020   GLUCOSE 87 12/01/2020   BUN 13 12/01/2020   CREATININE 1.13 12/01/2020   BILITOT 0.4 12/01/2020   ALKPHOS 90 12/01/2020   AST 14 12/01/2020   ALT 12 12/01/2020   PROT 6.8 12/01/2020   ALBUMIN 4.1 12/01/2020   CALCIUM 9.3 12/01/2020   ANIONGAP 9 07/05/2016   Lab Results  Component Value Date   CHOL 140 01/30/2020   Lab Results  Component Value Date   HDL 40 01/30/2020   Lab Results  Component Value Date   LDLCALC 78 01/30/2020   Lab Results  Component Value Date   TRIG 121 01/30/2020   Lab Results  Component Value Date   CHOLHDL 3.5 01/30/2020   No results found for: HGBA1C    Assessment & Plan:   Problem List Items Addressed This Visit       Cardiovascular and Mediastinum   DVT (deep venous thrombosis) ([facility]) - Primary Patient has completed 3 months of anticoagulation and now has no symptoms.  Can stop anticoagulant.     Other   Tobacco abuse Individual plan was given to patient based on exam, history and other tests and using evidence based criteria for care for smoking cessation.  We discussed behavioral changes to help cessation and offered mediicnes to aid in quitting.  Medical consequences of tobacco use were explained.        Follow-up: Return for chronic visit.     Reinaldo Meeker, MD

## 2021-06-18 DIAGNOSIS — H25811 Combined forms of age-related cataract, right eye: Secondary | ICD-10-CM | POA: Diagnosis not present

## 2021-06-18 DIAGNOSIS — H25813 Combined forms of age-related cataract, bilateral: Secondary | ICD-10-CM | POA: Diagnosis not present

## 2021-06-18 DIAGNOSIS — H40033 Anatomical narrow angle, bilateral: Secondary | ICD-10-CM | POA: Diagnosis not present

## 2021-06-18 DIAGNOSIS — H4423 Degenerative myopia, bilateral: Secondary | ICD-10-CM | POA: Diagnosis not present

## 2021-06-18 DIAGNOSIS — Z01818 Encounter for other preprocedural examination: Secondary | ICD-10-CM | POA: Diagnosis not present

## 2021-07-13 DIAGNOSIS — H52223 Regular astigmatism, bilateral: Secondary | ICD-10-CM | POA: Diagnosis not present

## 2021-07-13 DIAGNOSIS — H4423 Degenerative myopia, bilateral: Secondary | ICD-10-CM | POA: Diagnosis not present

## 2021-07-13 DIAGNOSIS — H25811 Combined forms of age-related cataract, right eye: Secondary | ICD-10-CM | POA: Diagnosis not present

## 2021-07-13 DIAGNOSIS — H259 Unspecified age-related cataract: Secondary | ICD-10-CM | POA: Diagnosis not present

## 2021-07-13 DIAGNOSIS — H40033 Anatomical narrow angle, bilateral: Secondary | ICD-10-CM | POA: Diagnosis not present

## 2021-07-20 ENCOUNTER — Telehealth: Payer: Self-pay

## 2021-07-20 NOTE — Chronic Care Management (AMB) (Signed)
Chronic Care Management Pharmacy Assistant   Name: Gerald Odom  MRN: [id number] DOB: [birth year]   Reason for Encounter: Disease State call for HTN   Recent office visits:  05/13/21 Reinaldo Meeker MD. Seen for deep vein thrombosis.No med changes.  Recent consult visits:  07/05/21 Orders Only (Rheumatology) [name]. Ordered prednisone 5 mg take daily as needed for rheumatoid arthritis.  Hospital visits:  None since 04/28/21  Medications: Outpatient Encounter Medications as of 07/20/2021  Medication Sig   allopurinol (ZYLOPRIM) 300 MG tablet Take 1 tablet (300 mg total) by mouth daily before breakfast.   Budeson-Glycopyrrol-Formoterol (BREZTRI AEROSPHERE) 160-9-4.8 MCG/ACT AERO Inhale 2 puffs into the lungs 2 (two) times daily.   celecoxib (CELEBREX) 100 MG capsule Take 200 mg by mouth.   ELIQUIS 5 MG TABS tablet Take 1 tablet (5 mg total) by mouth 2 (two) times daily.   etanercept (ENBREL) 50 MG/ML injection Inject 50 mg into the skin once a week.   fluticasone (FLONASE) 50 MCG/ACT nasal spray Place 2 sprays into both nostrils daily.   folic acid (FOLVITE) 1 MG tablet Take by mouth.   lisinopril (ZESTRIL) 5 MG tablet TAKE ONE (1) TABLET BY MOUTH TWO (2) TIMES DAILY   methotrexate (RHEUMATREX) 2.5 MG tablet Take 15 mg by mouth once a week.   No facility-administered encounter medications on file as of 07/20/2021.   Recent Relevant Labs: No results found for: HGBA1C, MICROALBUR  Kidney Function Lab Results  Component Value Date/Time   CREATININE 1.13 12/01/2020 08:28 AM   CREATININE 0.87 07/05/2016 03:27 PM   GFRNONAA 65 12/01/2020 08:28 AM   GFRAA 75 12/01/2020 08:28 AM      Recent Office Vitals: BP Readings from Last 3 Encounters:  05/13/21 124/80  03/10/21 118/76  12/01/20 128/78   Pulse Readings from Last 3 Encounters:  05/13/21 83  03/10/21 91  12/01/20 80    Wt Readings from Last 3 Encounters:  05/13/21 160 lb (72.6 kg)  03/10/21 156 lb 12.8 oz (71.1  kg)  02/15/21 155 lb (70.3 kg)     Kidney Function Lab Results  Component Value Date/Time   CREATININE 1.13 12/01/2020 08:28 AM   CREATININE 0.87 07/05/2016 03:27 PM   GFRNONAA 65 12/01/2020 08:28 AM   GFRAA 75 12/01/2020 08:28 AM    BMP Latest Ref Rng & Units 12/01/2020 07/05/2016 07/05/2016  Glucose 65 - 99 mg/dL 87 123(H) 113(H)  BUN 8 - 27 mg/dL 13 7 7   Creatinine 0.76 - 1.27 mg/dL 1.13 0.87 0.92  BUN/Creat Ratio 10 - 24 12 - -  Sodium 134 - 144 mmol/L 140 140 141  Potassium 3.5 - 5.2 mmol/L 4.2 3.8 4.1  Chloride 96 - 106 mmol/L 106 107 105  CO2 20 - 29 mmol/L 19(L) 24 28  Calcium 8.6 - 10.2 mg/dL 9.3 9.3 9.5     Current antihypertensive regimen:  Lisinopril 5 mg two times daily    Patient verbally confirms he is taking the above medications as directed. Yes  How often are you checking your Blood Pressure? infrequently, pt stated he rarely checks his BP. Mostly at the doctors.   Caffeine intake:2 cups of coffee  Salt intake:Pt states no changes and doesn't really watch salt intake  OTC medications including pseudoephedrine or NSAIDs? No OTC meds other than allergy medications   Any readings above 180/120? No, pt stated not that he is aware of.  What recent interventions/DTPs have been made by any provider to improve Blood Pressure  control since last CPP Visit: Pt stated no changes in regards to BP but wanted to inform you that he is off the Eliquis due to blood clots have resolved.  Any recent hospitalizations or ED visits since last visit with CPP? He states his last ER visit was back in April, 2022  What diet changes have been made to improve Blood Pressure Control?  No changes   What exercise is being done to improve your Blood Pressure Control?  Pt states he stays busy and active all the time.   Adherence Review: Is the patient currently on ACE/ARB medication? Yes Does the patient have >5 day gap between last estimated fill dates? Yes, pt stated he has a few  pills left and stated he is going to call in a refill for the lisinopril   Care Gaps: Last annual wellness visit? 03/10/21  Star Rating Drugs:  Medication:  Last Fill: Day Supply Lisinopril   05/11/21 [place] Clinical Pharmacist Assitant [phone]

## 2021-08-09 DIAGNOSIS — M1A09X0 Idiopathic chronic gout, multiple sites, without tophus (tophi): Secondary | ICD-10-CM | POA: Diagnosis not present

## 2021-08-09 DIAGNOSIS — Z79899 Other long term (current) drug therapy: Secondary | ICD-10-CM | POA: Diagnosis not present

## 2021-08-09 DIAGNOSIS — M0579 Rheumatoid arthritis with rheumatoid factor of multiple sites without organ or systems involvement: Secondary | ICD-10-CM | POA: Diagnosis not present

## 2021-08-21 DIAGNOSIS — J209 Acute bronchitis, unspecified: Secondary | ICD-10-CM | POA: Diagnosis not present

## 2021-08-21 DIAGNOSIS — R0981 Nasal congestion: Secondary | ICD-10-CM | POA: Diagnosis not present

## 2021-08-21 DIAGNOSIS — R051 Acute cough: Secondary | ICD-10-CM | POA: Diagnosis not present

## 2021-08-21 DIAGNOSIS — J0100 Acute maxillary sinusitis, unspecified: Secondary | ICD-10-CM | POA: Diagnosis not present

## 2021-08-31 DIAGNOSIS — H25812 Combined forms of age-related cataract, left eye: Secondary | ICD-10-CM | POA: Diagnosis not present

## 2021-08-31 DIAGNOSIS — Z01818 Encounter for other preprocedural examination: Secondary | ICD-10-CM | POA: Diagnosis not present

## 2021-09-01 ENCOUNTER — Ambulatory Visit (INDEPENDENT_AMBULATORY_CARE_PROVIDER_SITE_OTHER): Payer: PPO | Admitting: Nurse Practitioner

## 2021-09-01 ENCOUNTER — Encounter: Payer: Self-pay | Admitting: Nurse Practitioner

## 2021-09-01 VITALS — BP 132/66 | HR 75 | Temp 97.0°F | Ht 67.0 in | Wt 164.0 lb

## 2021-09-01 DIAGNOSIS — R0981 Nasal congestion: Secondary | ICD-10-CM | POA: Diagnosis not present

## 2021-09-01 DIAGNOSIS — J0191 Acute recurrent sinusitis, unspecified: Secondary | ICD-10-CM | POA: Diagnosis not present

## 2021-09-01 LAB — POC COVID19 BINAXNOW: SARS Coronavirus 2 Ag: NEGATIVE

## 2021-09-01 LAB — POCT INFLUENZA A/B
Influenza A, POC: NEGATIVE
Influenza B, POC: NEGATIVE

## 2021-09-01 MED ORDER — FLUTICASONE PROPIONATE 50 MCG/ACT NA SUSP: 2.0000 | Freq: Every day | NASAL | 6 refills | Status: DC

## 2021-09-01 MED ORDER — AMOXICILLIN-POT CLAVULANATE 875-125 MG PO TABS
1.0000 | ORAL_TABLET | Freq: Two times a day (BID) | ORAL | 0 refills | Status: DC
Start: 2021-09-01 — End: 2021-11-04

## 2021-09-01 NOTE — Addendum Note (Signed)
Addended by: Alan Ripper A on: 09/01/2021 09:59 AM   Modules accepted: Orders

## 2021-09-01 NOTE — Progress Notes (Signed)
Acute Office Visit  Subjective:    Patient ID: Gerald Odom, male    DOB: 1951-03-25, 70 y.o.   MRN: [id number]  Chief Complaint  Patient presents with   URI    HPI: Gerald Odom is a 70 year old Caucasian male that presents with sinus congestion/pressure, rhinorrhea, post-nasal-drip, and fatigue. Onset of symptoms was 3-weeks ago. Treatment has included Azithromycin that was prescribed by Urgent Care physician on 08/21/21. Pt states symptoms have failed to improve. States he has chronic allergic rhinitis that is treated with Flonase nasal spray but he does not have any currently. He has RA that is treated with Embrel injections, that is due today. He is a long-time cigarette smoker. Past history of bronchitis and pneumonia.   Past Medical History:  Diagnosis Date   Acute cholangitis 05/14/2016   Arthritis    Rheumatoid arthritis   Choledocholithiasis 05/16/2016   Gallstone pancreatitis 05/14/2016   Gout    Hearing loss of left ear due to cerumen impaction 02/09/2020   Hyperlipidemia    Hypertension    Pancreatitis 05/2016   hosp. [facility]   Pneumonia 05/2016    Past Surgical History:  Procedure Laterality Date   CHOLECYSTECTOMY     CHOLECYSTECTOMY N/A 07/04/2016   Procedure: LAPAROSCOPIC CHOLECYSTECTOMY WITH INTRAOPERATIVE CHOLANGIOGRAM;  Surgeon: Jackolyn Confer, MD;  Location: [facility];  Service: General;  Laterality: N/A;   ERCP N/A 05/16/2016   Procedure: ENDOSCOPIC RETROGRADE CHOLANGIOPANCREATOGRAPHY (ERCP);  Surgeon: Doran Stabler, MD;  Location: [facility];  Service: Endoscopy;  Laterality: N/A;   KNEE SURGERY Left 1997    Family History  Problem Relation Age of Onset   Anxiety disorder Father     Social History   Socioeconomic History   Marital status: Married    Spouse name: Not on file   Number of children: Not on file   Years of education: Not on file   Highest education level: Not on file  Occupational History   Not on file  Tobacco Use   Smoking status:  Every Day    Packs/day: 0.50    Years: 45.00    Pack years: 22.50    Types: Cigarettes   Smokeless tobacco: Never  Substance and Sexual Activity   Alcohol use: Yes    Alcohol/week: 1.0 standard drink    Types: 1 Standard drinks or equivalent per week    Comment: ocasionally   Drug use: No   Sexual activity: Yes    Partners: Female  Other Topics Concern   Not on file  Social History Narrative   Not on file   Social Determinants of Health   Financial Resource Strain: Not on file  Food Insecurity: No Food Insecurity   Worried About Charity fundraiser in the Last Year: Never true   Ran Out of Food in the Last Year: Never true  Transportation Needs: No Transportation Needs   Lack of Transportation (Medical): No   Lack of Transportation (Non-Medical): No  Physical Activity: Not on file  Stress: Not on file  Social Connections: Not on file  Intimate Partner Violence: Not on file    Outpatient Medications Prior to Visit  Medication Sig Dispense Refill   allopurinol (ZYLOPRIM) 300 MG tablet Take 1 tablet (300 mg total) by mouth daily before breakfast. 90 tablet 1   Budeson-Glycopyrrol-Formoterol (BREZTRI AEROSPHERE) 160-9-4.8 MCG/ACT AERO Inhale 2 puffs into the lungs 2 (two) times daily. 10.7 g 5   celecoxib (CELEBREX) 100 MG capsule Take 200 mg by mouth.  etanercept (ENBREL) 50 MG/ML injection Inject 50 mg into the skin once a week.     fluticasone (FLONASE) 50 MCG/ACT nasal spray Place 2 sprays into both nostrils daily. 16 g 1   folic acid (FOLVITE) 1 MG tablet Take by mouth.     lisinopril (ZESTRIL) 5 MG tablet TAKE ONE (1) TABLET BY MOUTH TWO (2) TIMES DAILY 180 tablet 1   methotrexate (RHEUMATREX) 2.5 MG tablet Take 15 mg by mouth once a week.     ELIQUIS 5 MG TABS tablet Take 1 tablet (5 mg total) by mouth 2 (two) times daily. 60 tablet 3   No facility-administered medications prior to visit.    No Known Allergies  Review of Systems  Constitutional:  Positive for  chills. Negative for diaphoresis, fatigue and fever.  HENT:  Positive for congestion, postnasal drip, rhinorrhea, sinus pressure and sinus pain. Negative for ear pain, sneezing and sore throat.   Eyes: Negative.   Respiratory:  Positive for cough. Negative for shortness of breath.   Cardiovascular:  Negative for chest pain and leg swelling.  Gastrointestinal:  Negative for diarrhea, nausea and vomiting.  Endocrine: Negative.   Genitourinary: Negative.   Musculoskeletal:  Positive for arthralgias (chronic-RA) and joint swelling.  Skin: Negative.   Allergic/Immunologic: Positive for environmental allergies and immunocompromised state.  Neurological:  Positive for headaches. Negative for dizziness.  Hematological: Negative.   Psychiatric/Behavioral: Negative.        Objective:    Physical Exam Vitals reviewed.  Constitutional:      Appearance: He is ill-appearing.  HENT:     Nose: Congestion and rhinorrhea present.     Mouth/Throat:     Pharynx: Posterior oropharyngeal erythema present.  Cardiovascular:     Rate and Rhythm: Normal rate and regular rhythm.     Pulses: Normal pulses.     Heart sounds: Normal heart sounds.  Pulmonary:     Effort: Pulmonary effort is normal.     Breath sounds: Normal breath sounds.  Abdominal:     General: Bowel sounds are normal.     Palpations: Abdomen is soft.  Skin:    General: Skin is warm and dry.     Capillary Refill: Capillary refill takes less than 2 seconds.  Neurological:     General: No focal deficit present.     Mental Status: He is alert.  Psychiatric:        Mood and Affect: Mood normal.        Behavior: Behavior normal.    Pulse 75   Temp (!) 97 F (36.1 C)   Ht 5\' 7"  (1.702 m)   Wt 164 lb (74.4 kg)   SpO2 97%   BMI 25.69 kg/m  BP 132/66   Pulse 75   Temp (!) 97 F (36.1 C)   Ht 5\' 7"  (1.702 m)   Wt 164 lb (74.4 kg)   SpO2 97%   BMI 25.69 kg/m    Wt Readings from Last 3 Encounters:  09/01/21 164 lb (74.4 kg)   05/13/21 160 lb (72.6 kg)  03/10/21 156 lb 12.8 oz (71.1 kg)    Health Maintenance Due  Topic Date Due   Hepatitis C Screening  Never done   Zoster Vaccines- Shingrix (1 of 2) Never done   COVID-19 Vaccine (4 - Booster for Pfizer series) 02/11/2021   INFLUENZA VACCINE  05/17/2021       Lab Results  Component Value Date   TSH 1.880 01/30/2020   Lab Results  Component Value Date   WBC 9.5 12/01/2020   HGB 16.4 12/01/2020   HCT 47.6 12/01/2020   MCV 97 12/01/2020   PLT 248 12/01/2020   Lab Results  Component Value Date   NA 140 12/01/2020   K 4.2 12/01/2020   CO2 19 (L) 12/01/2020   GLUCOSE 87 12/01/2020   BUN 13 12/01/2020   CREATININE 1.13 12/01/2020   BILITOT 0.4 12/01/2020   ALKPHOS 90 12/01/2020   AST 14 12/01/2020   ALT 12 12/01/2020   PROT 6.8 12/01/2020   ALBUMIN 4.1 12/01/2020   CALCIUM 9.3 12/01/2020   ANIONGAP 9 07/05/2016   Lab Results  Component Value Date   CHOL 140 01/30/2020   Lab Results  Component Value Date   HDL 40 01/30/2020   Lab Results  Component Value Date   LDLCALC 78 01/30/2020   Lab Results  Component Value Date   TRIG 121 01/30/2020   Lab Results  Component Value Date   CHOLHDL 3.5 01/30/2020        Assessment & Plan:   1. Acute recurrent sinusitis, unspecified location - amoxicillin-clavulanate (AUGMENTIN) 875-125 MG tablet; Take 1 tablet by mouth 2 (two) times daily.  Dispense: 20 tablet; Refill: 0 - fluticasone (FLONASE) 50 MCG/ACT nasal spray; Place 2 sprays into both nostrils daily.  Dispense: 16 g; Refill: 6    Take Augmentin twice daily for 10 days Use Flonase nasal spray daily  Follow-up: PRN  An After Visit Summary was printed and given to the patient.  I, Rip Harbour, NP, have reviewed all documentation for this visit. The documentation on 09/01/21 for the exam, diagnosis, procedures, and orders are all accurate and complete.    Signed, Rip Harbour, NP [place] [phone]

## 2021-09-01 NOTE — Patient Instructions (Signed)
Take Augmentin twice daily for 10 days Use Flonase nasal spray daily  Sinusitis, Adult Sinusitis is soreness and swelling (inflammation) of your sinuses. Sinuses are hollow spaces in the bones around your face. They are located: Around your eyes. In the middle of your forehead. Behind your nose. In your cheekbones. Your sinuses and nasal passages are lined with a fluid called mucus. Mucus drains out of your sinuses. Swelling can trap mucus in your sinuses. This lets germs (bacteria, virus, or fungus) grow, which leads to infection. Most of the time, this condition is caused by a virus. What are the causes? This condition is caused by: Allergies. Asthma. Germs. Things that block your nose or sinuses. Growths in the nose (nasal polyps). Chemicals or irritants in the air. Fungus (rare). What increases the risk? You are more likely to develop this condition if: You have a weak body defense system (immune system). You do a lot of swimming or diving. You use nasal sprays too much. You smoke. What are the signs or symptoms? The main symptoms of this condition are pain and a feeling of pressure around the sinuses. Other symptoms include: Stuffy nose (congestion). Runny nose (drainage). Swelling and warmth in the sinuses. Headache. Toothache. A cough that may get worse at night. Mucus that collects in the throat or the back of the nose (postnasal drip). Being unable to smell and taste. Being very tired (fatigue). A fever. Sore throat. Bad breath. How is this diagnosed? This condition is diagnosed based on: Your symptoms. Your medical history. A physical exam. Tests to find out if your condition is short-term (acute) or long-term (chronic). Your doctor may: Check your nose for growths (polyps). Check your sinuses using a tool that has a light (endoscope). Check for allergies or germs. Do imaging tests, such as an MRI or CT scan. How is this treated? Treatment for this  condition depends on the cause and whether it is short-term or long-term. If caused by a virus, your symptoms should go away on their own within 10 days. You may be given medicines to relieve symptoms. They include: Medicines that shrink swollen tissue in the nose. Medicines that treat allergies (antihistamines). A spray that treats swelling of the nostrils.  Rinses that help get rid of thick mucus in your nose (nasal saline washes). If caused by bacteria, your doctor may wait to see if you will get better without treatment. You may be given antibiotic medicine if you have: A very bad infection. A weak body defense system. If caused by growths in the nose, you may need to have surgery. Follow these instructions at home: Medicines Take, use, or apply over-the-counter and prescription medicines only as told by your doctor. These may include nasal sprays. If you were prescribed an antibiotic medicine, take it as told by your doctor. Do not stop taking the antibiotic even if you start to feel better. Hydrate and humidify  Drink enough water to keep your pee (urine) pale yellow. Use a cool mist humidifier to keep the humidity level in your home above 50%. Breathe in steam for 10-15 minutes, 3-4 times a day, or as told by your doctor. You can do this in the bathroom while a hot shower is running. Try not to spend time in cool or dry air. Rest Rest as much as you can. Sleep with your head raised (elevated). Make sure you get enough sleep each night. General instructions  Put a warm, moist washcloth on your face 3-4 times a day,  or as often as told by your doctor. This will help with discomfort. Wash your hands often with soap and water. If there is no soap and water, use hand sanitizer. Do not smoke. Avoid being around people who are smoking (secondhand smoke). Keep all follow-up visits as told by your doctor. This is important. Contact a doctor if: You have a fever. Your symptoms get  worse. Your symptoms do not get better within 10 days. Get help right away if: You have a very bad headache. You cannot stop throwing up (vomiting). You have very bad pain or swelling around your face or eyes. You have trouble seeing. You feel confused. Your neck is stiff. You have trouble breathing. Summary Sinusitis is swelling of your sinuses. Sinuses are hollow spaces in the bones around your face. This condition is caused by tissues in your nose that become inflamed or swollen. This traps germs. These can lead to infection. If you were prescribed an antibiotic medicine, take it as told by your doctor. Do not stop taking it even if you start to feel better. Keep all follow-up visits as told by your doctor. This is important. This information is not intended to replace advice given to you by your health care provider. Make sure you discuss any questions you have with your health care provider. Document Revised: 03/05/2018 Document Reviewed: 03/05/2018 Elsevier Patient Education  2022 [employer].

## 2021-09-07 DIAGNOSIS — Z7901 Long term (current) use of anticoagulants: Secondary | ICD-10-CM | POA: Diagnosis not present

## 2021-09-07 DIAGNOSIS — I10 Essential (primary) hypertension: Secondary | ICD-10-CM | POA: Diagnosis not present

## 2021-09-07 DIAGNOSIS — H25812 Combined forms of age-related cataract, left eye: Secondary | ICD-10-CM | POA: Diagnosis not present

## 2021-09-07 DIAGNOSIS — H4423 Degenerative myopia, bilateral: Secondary | ICD-10-CM | POA: Diagnosis not present

## 2021-09-07 DIAGNOSIS — F17210 Nicotine dependence, cigarettes, uncomplicated: Secondary | ICD-10-CM | POA: Diagnosis not present

## 2021-09-07 DIAGNOSIS — H259 Unspecified age-related cataract: Secondary | ICD-10-CM | POA: Diagnosis not present

## 2021-09-07 DIAGNOSIS — H52223 Regular astigmatism, bilateral: Secondary | ICD-10-CM | POA: Diagnosis not present

## 2021-09-07 DIAGNOSIS — H40033 Anatomical narrow angle, bilateral: Secondary | ICD-10-CM | POA: Diagnosis not present

## 2021-11-04 ENCOUNTER — Other Ambulatory Visit: Payer: Self-pay | Admitting: Nurse Practitioner

## 2021-11-04 ENCOUNTER — Ambulatory Visit (INDEPENDENT_AMBULATORY_CARE_PROVIDER_SITE_OTHER): Payer: PPO | Admitting: Nurse Practitioner

## 2021-11-04 ENCOUNTER — Encounter: Payer: Self-pay | Admitting: Nurse Practitioner

## 2021-11-04 VITALS — BP 118/62 | HR 73 | Temp 95.6°F | Ht 67.0 in | Wt 163.0 lb

## 2021-11-04 DIAGNOSIS — J309 Allergic rhinitis, unspecified: Secondary | ICD-10-CM

## 2021-11-04 DIAGNOSIS — D84821 Immunodeficiency due to drugs: Secondary | ICD-10-CM | POA: Diagnosis not present

## 2021-11-04 DIAGNOSIS — J0181 Other acute recurrent sinusitis: Secondary | ICD-10-CM

## 2021-11-04 DIAGNOSIS — Z79899 Other long term (current) drug therapy: Secondary | ICD-10-CM | POA: Diagnosis not present

## 2021-11-04 DIAGNOSIS — R0981 Nasal congestion: Secondary | ICD-10-CM

## 2021-11-04 DIAGNOSIS — M1A0490 Idiopathic chronic gout, unspecified hand, without tophus (tophi): Secondary | ICD-10-CM

## 2021-11-04 LAB — POCT INFLUENZA A/B
Influenza A, POC: NEGATIVE
Influenza B, POC: NEGATIVE

## 2021-11-04 LAB — POC COVID19 BINAXNOW: SARS Coronavirus 2 Ag: NEGATIVE

## 2021-11-04 MED ORDER — CETIRIZINE HCL 10 MG PO TABS
10.0000 mg | ORAL_TABLET | Freq: Every day | ORAL | 0 refills | Status: DC
Start: 2021-11-04 — End: 2022-03-15

## 2021-11-04 MED ORDER — ALLOPURINOL 300 MG PO TABS: 300.0000 mg | ORAL_TABLET | Freq: Every day | ORAL | 1 refills | Status: DC

## 2021-11-04 MED ORDER — AMOXICILLIN-POT CLAVULANATE 875-125 MG PO TABS: 1.0000 | ORAL_TABLET | Freq: Two times a day (BID) | ORAL | 0 refills | Status: DC

## 2021-11-04 NOTE — Progress Notes (Signed)
Acute Office Visit  Subjective:    Patient ID: Gerald Odom, male    DOB: 04-12-1951, 70 y.o.   MRN: [id number]  Chief Complaint  Patient presents with   URI    HPI: Patient is in today for Upper respiratory symptoms He complains of congestion, nasal congestion, non productive cough, and post nasal drip. Onset of symptoms was about a week ago and staying constant.He uses Flonase nasal spray daily for chronic allergic rhinitis. Not currently taking antihistamine.He is a long-time cigarette smoker. Past history of pneumonia. He is immunocompromised due to Methotrexate and Enbrel treatment for RA.    Past Medical History:  Diagnosis Date   Acute cholangitis 05/14/2016   Arthritis    Rheumatoid arthritis   Choledocholithiasis 05/16/2016   Gallstone pancreatitis 05/14/2016   Gout    Hearing loss of left ear due to cerumen impaction 02/09/2020   Hyperlipidemia    Hypertension    Pancreatitis 05/2016   [facility]osp. [facility]   Pneumonia 05/2016    Past Surgical History:  Procedure Laterality Date   CHOLECYSTECTOMY     CHOLECYSTECTOMY N/A 07/04/2016   Procedure: LAPAROSCOPIC CHOLECYSTECTOMY WITH INTRAOPERATIVE CHOLANGIOGRAM;  Surgeon: Jackolyn Confer, MD;  Location: [facility];  Service: General;  Laterality: N/A;   ERCP N/A 05/16/2016   Procedure: ENDOSCOPIC RETROGRADE CHOLANGIOPANCREATOGRAPHY (ERCP);  Surgeon: Doran Stabler, MD;  Location: [facility];  Service: Endoscopy;  Laterality: N/A;   KNEE SURGERY Left 1997    Family History  Problem Relation Age of Onset   Anxiety disorder Father     Social History   Socioeconomic History   Marital status: Married    Spouse name: Not on file   Number of children: Not on file   Years of education: Not on file   Highest education level: Not on file  Occupational History   Not on file  Tobacco Use   Smoking status: Every Day    Packs/day: 0.50    Years: 45.00    Pack years: 22.50    Types: Cigarettes   Smokeless tobacco: Never   Substance and Sexual Activity   Alcohol use: Yes    Alcohol/week: 1.0 standard drink    Types: 1 Standard drinks or equivalent per week    Comment: ocasionally   Drug use: No   Sexual activity: Yes    Partners: Female  Other Topics Concern   Not on file  Social History Narrative   Not on file   Social Determinants of Health   Financial Resource Strain: Not on file  Food Insecurity: No Food Insecurity   Worried About Charity fundraiser in the Last Year: Never true   Ran Out of Food in the Last Year: Never true  Transportation Needs: No Transportation Needs   Lack of Transportation (Medical): No   Lack of Transportation (Non-Medical): No  Physical Activity: Not on file  Stress: Not on file  Social Connections: Not on file  Intimate Partner Violence: Not on file    Outpatient Medications Prior to Visit  Medication Sig Dispense Refill   allopurinol (ZYLOPRIM) 300 MG tablet Take 1 tablet (300 mg total) by mouth daily before breakfast. 90 tablet 1   amoxicillin-clavulanate (AUGMENTIN) 875-125 MG tablet Take 1 tablet by mouth 2 (two) times daily. 20 tablet 0   Budeson-Glycopyrrol-Formoterol (BREZTRI AEROSPHERE) 160-9-4.8 MCG/ACT AERO Inhale 2 puffs into the lungs 2 (two) times daily. 10.7 g 5   celecoxib (CELEBREX) 100 MG capsule Take 200 mg by mouth.  etanercept (ENBREL) 50 MG/ML injection Inject 50 mg into the skin once a week.     fluticasone (FLONASE) 50 MCG/ACT nasal spray Place 2 sprays into both nostrils daily. 16 g 1   fluticasone (FLONASE) 50 MCG/ACT nasal spray Place 2 sprays into both nostrils daily. 16 g 6   folic acid (FOLVITE) 1 MG tablet Take by mouth.     lisinopril (ZESTRIL) 5 MG tablet TAKE ONE (1) TABLET BY MOUTH TWO (2) TIMES DAILY 180 tablet 1   methotrexate (RHEUMATREX) 2.5 MG tablet Take 15 mg by mouth once a week.     No facility-administered medications prior to visit.    No Known Allergies  Review of Systems  Constitutional:  Positive for  fatigue. Negative for chills and fever.  HENT:  Positive for congestion, postnasal drip, rhinorrhea, sinus pressure, sinus pain and sore throat. Negative for ear pain.   Respiratory:  Positive for cough. Negative for shortness of breath.   Cardiovascular:  Negative for chest pain.  Gastrointestinal:  Negative for diarrhea, nausea and vomiting.  Musculoskeletal:  Positive for arthralgias (RA), joint swelling (RA) and myalgias (RA).  Allergic/Immunologic: Positive for environmental allergies and immunocompromised state.  Neurological:  Negative for dizziness and headaches.      Objective:    Physical Exam Vitals reviewed.  HENT:     Right Ear: Tympanic membrane normal.     Left Ear: Tympanic membrane normal.     Nose: Congestion and rhinorrhea present.     Mouth/Throat:     Pharynx: Posterior oropharyngeal erythema present.  Cardiovascular:     Rate and Rhythm: Normal rate and regular rhythm.     Pulses: Normal pulses.     Heart sounds: Normal heart sounds.  Pulmonary:     Effort: Pulmonary effort is normal.     Breath sounds: Normal breath sounds.  Abdominal:     General: Bowel sounds are normal.     Palpations: Abdomen is soft.  Musculoskeletal:     Cervical back: Neck supple.  Skin:    General: Skin is warm and dry.     Capillary Refill: Capillary refill takes less than 2 seconds.  Neurological:     General: No focal deficit present.     Mental Status: He is alert and oriented to person, place, and time.  Psychiatric:        Mood and Affect: Mood normal.        Behavior: Behavior normal.    Ht 5\' 7"  (1.702 m)    Wt 163 lb (73.9 kg)    BMI 25.53 kg/m  BP 118/62    Pulse 73    Temp (!) 95.6 F (35.3 C)    Ht 5\' 7"  (1.702 m)    Wt 163 lb (73.9 kg)    SpO2 97%    BMI 25.53 kg/m   Wt Readings from Last 3 Encounters:  11/04/21 163 lb (73.9 kg)  09/01/21 164 lb (74.4 kg)  05/13/21 160 lb (72.6 kg)    Health Maintenance Due  Topic Date Due   COVID-19 Vaccine (4 -  Booster for Pfizer series) 02/11/2021       Lab Results  Component Value Date   TSH 1.880 01/30/2020   Lab Results  Component Value Date   WBC 9.5 12/01/2020   HGB 16.4 12/01/2020   HCT 47.6 12/01/2020   MCV 97 12/01/2020   PLT 248 12/01/2020   Lab Results  Component Value Date   NA 140 12/01/2020  K 4.2 12/01/2020   CO2 19 (L) 12/01/2020   GLUCOSE 87 12/01/2020   BUN 13 12/01/2020   CREATININE 1.13 12/01/2020   BILITOT 0.4 12/01/2020   ALKPHOS 90 12/01/2020   AST 14 12/01/2020   ALT 12 12/01/2020   PROT 6.8 12/01/2020   ALBUMIN 4.1 12/01/2020   CALCIUM 9.3 12/01/2020   ANIONGAP 9 07/05/2016   Lab Results  Component Value Date   CHOL 140 01/30/2020   Lab Results  Component Value Date   HDL 40 01/30/2020   Lab Results  Component Value Date   LDLCALC 78 01/30/2020   Lab Results  Component Value Date   TRIG 121 01/30/2020   Lab Results  Component Value Date   CHOLHDL 3.5 01/30/2020        Assessment & Plan:   1. Other acute recurrent sinusitis - amoxicillin-clavulanate (AUGMENTIN) 875-125 MG tablet; Take 1 tablet by mouth 2 (two) times daily.  Dispense: 20 tablet; Refill: 0  2. Chronic allergic rhinitis - cetirizine (ZYRTEC) 10 MG tablet; Take 1 tablet (10 mg total) by mouth daily.  Dispense: 10 tablet; Refill: 0 -continue Flonase nasal spray  3. Congestion of nasal sinus - POCT Influenza A/BNegative - POC COVID-19 BinaxNowNegative  4. Immunocompromised state due to drug therapy ([place]) -Notify office if symptoms fail to improve or worsen       Follow-up: PRN  An After Visit Summary was printed and given to the patient.  I, Rip Harbour, NP, have reviewed all documentation for this visit. The documentation on 11/04/21 for the exam, diagnosis, procedures, and orders are all accurate and complete.    Signed, Rip Harbour, NP [place] [phone]

## 2021-11-04 NOTE — Patient Instructions (Addendum)

## 2021-11-17 NOTE — Progress Notes (Signed)
Subjective:  Patient ID: Gerald Odom, male    DOB: [birth year]  Age: 71 y.o. MRN: [id number]  Chief Complaint  Patient presents with   Hypertension   Gastroesophageal Reflux   Gout    HPI Hypertension: Patient is taking lisinopril 5 mg daily.  Gout: Currently taking allopurinol 300 mg daily.  RA:  He takes methotrexate 15 mg weekly, Folate 1mg  once daily, Enbrel 50 mg weekly, celebrex 100 mg 1-2 daily as needed .  Recurrent sinus issues: patient has had 2 courses of antibiotics since 08/2021. Does not seem to get better. Patient has PND and for about 3 hours blows out mucous which is often clear, but sometimes has yellow to green discolor. Patient has tried zyrtec, allegra, flonase, and saline nasal spray and do not seem to help. Patient broke his nose years ago. Patient saw ENT, Dr. Gaylyn Cheers, and was scheduled for ent surgery, but developed 2 right leg DVTs. Patient took eliquis for 6 months.   COPD: On breztri 2 puffs twice daily. Not wheezing. Has not smoked in about 2 weeks. Used to smoke 1 ppd.   Current Outpatient Medications on File Prior to Visit  Medication Sig Dispense Refill   allopurinol (ZYLOPRIM) 300 MG tablet Take 1 tablet (300 mg total) by mouth daily before breakfast. 90 tablet 1   Budeson-Glycopyrrol-Formoterol (BREZTRI AEROSPHERE) 160-9-4.8 MCG/ACT AERO Inhale 2 puffs into the lungs 2 (two) times daily. 10.7 g 5   celecoxib (CELEBREX) 100 MG capsule Take 200 mg by mouth.     cetirizine (ZYRTEC) 10 MG tablet Take 1 tablet (10 mg total) by mouth daily. 10 tablet 0   etanercept (ENBREL) 50 MG/ML injection Inject 50 mg into the skin once a week.     fluticasone (FLONASE) 50 MCG/ACT nasal spray Place 2 sprays into both nostrils daily. 16 g 1   folic acid (FOLVITE) 1 MG tablet Take by mouth.     lisinopril (ZESTRIL) 5 MG tablet TAKE ONE (1) TABLET BY MOUTH TWO (2) TIMES DAILY 180 tablet 1   methotrexate (RHEUMATREX) 2.5 MG tablet Take 15 mg by mouth once a week.     No  current facility-administered medications on file prior to visit.   Past Medical History:  Diagnosis Date   Acute cholangitis 05/14/2016   Arthritis    Rheumatoid arthritis   Choledocholithiasis 05/16/2016   DVT (deep venous thrombosis) ([place]) 02/10/2021       Gallstone pancreatitis 05/14/2016   Gout    Hearing loss of left ear due to cerumen impaction 02/09/2020   Hyperlipidemia    Hypertension    Pancreatitis 05/2016   hosp. [facility]   Pneumonia 05/2016   Past Surgical History:  Procedure Laterality Date   CHOLECYSTECTOMY     CHOLECYSTECTOMY N/A 07/04/2016   Procedure: LAPAROSCOPIC CHOLECYSTECTOMY WITH INTRAOPERATIVE CHOLANGIOGRAM;  Surgeon: Jackolyn Confer, MD;  Location: [facility];  Service: General;  Laterality: N/A;   ERCP N/A 05/16/2016   Procedure: ENDOSCOPIC RETROGRADE CHOLANGIOPANCREATOGRAPHY (ERCP);  Surgeon: Doran Stabler, MD;  Location: [facility];  Service: Endoscopy;  Laterality: N/A;   KNEE SURGERY Left 1997    Family History  Problem Relation Age of Onset   Anxiety disorder Father    Social History   Socioeconomic History   Marital status: Married    Spouse name: Not on file   Number of children: Not on file   Years of education: Not on file   Highest education level: Not on file  Occupational History  Not on file  Tobacco Use   Smoking status: Former    Packs/day: 0.50    Years: 45.00    Pack years: 22.50    Types: Cigarettes    Quit date: 11/06/2021    Years since quitting: 0.0   Smokeless tobacco: Never  Substance and Sexual Activity   Alcohol use: Yes    Alcohol/week: 1.0 standard drink    Types: 1 Standard drinks or equivalent per week    Comment: ocasionally   Drug use: No   Sexual activity: Yes    Partners: Female  Other Topics Concern   Not on file  Social History Narrative   Not on file   Social Determinants of Health   Financial Resource Strain: Not on file  Food Insecurity: Not on file  Transportation Needs: Not on file  Physical  Activity: Not on file  Stress: Not on file  Social Connections: Not on file    Review of Systems  Constitutional:  Negative for chills and fever.  HENT:  Positive for congestion and rhinorrhea. Negative for sore throat.   Respiratory:  Positive for cough. Negative for shortness of breath.   Cardiovascular:  Negative for chest pain and palpitations.  Gastrointestinal:  Negative for abdominal pain, constipation, diarrhea, nausea and vomiting.  Genitourinary:  Negative for dysuria and urgency.  Musculoskeletal:  Positive for arthralgias (shoulder and foot pain). Negative for back pain and myalgias.  Neurological:  Negative for dizziness and headaches.  Psychiatric/Behavioral:  Negative for dysphoric mood. The patient is not nervous/anxious.     Objective:  BP 138/70    Pulse 78    Temp (!) 96.9 F (36.1 C)    Resp 16    Ht 5\' 8"  (1.727 m)    Wt 164 lb (74.4 kg)    SpO2 98%    BMI 24.94 kg/m   BP/Weight 11/18/2021 11/04/2021 37/90/2409  Systolic BP 735 329 924  Diastolic BP 70 62 66  Wt. (Lbs) 164 163 164  BMI 24.94 25.53 25.69    Physical Exam Constitutional:      Appearance: Normal appearance.  HENT:     Right Ear: Tympanic membrane, ear canal and external ear normal.     Left Ear: Tympanic membrane, ear canal and external ear normal.     Nose: Nose normal. No congestion or rhinorrhea.     Comments: Deviated septum. Left nostril erythema and turbinates swollen.     Mouth/Throat:     Mouth: Mucous membranes are moist.     Pharynx: Posterior oropharyngeal erythema present. No oropharyngeal exudate.  Cardiovascular:     Rate and Rhythm: Normal rate and regular rhythm.     Heart sounds: Normal heart sounds.  Pulmonary:     Effort: Pulmonary effort is normal. No respiratory distress.     Breath sounds: Normal breath sounds. No wheezing, rhonchi or rales.  Abdominal:     General: Bowel sounds are normal.     Palpations: Abdomen is soft.     Tenderness: There is no abdominal  tenderness.  Lymphadenopathy:     Cervical: No cervical adenopathy.  Neurological:     Mental Status: He is alert and oriented to person, place, and time.  Psychiatric:        Mood and Affect: Mood normal.        Behavior: Behavior normal.    Diabetic Foot Exam - Simple   No data filed      Lab Results  Component Value Date  WBC 9.5 12/01/2020   HGB 16.4 12/01/2020   HCT 47.6 12/01/2020   PLT 248 12/01/2020   GLUCOSE 87 12/01/2020   CHOL 140 01/30/2020   TRIG 121 01/30/2020   HDL 40 01/30/2020   LDLCALC 78 01/30/2020   ALT 12 12/01/2020   AST 14 12/01/2020   NA 140 12/01/2020   K 4.2 12/01/2020   CL 106 12/01/2020   CREATININE 1.13 12/01/2020   BUN 13 12/01/2020   CO2 19 (L) 12/01/2020   TSH 1.880 01/30/2020   INR 0.97 05/14/2016      Assessment & Plan:   Problem List Items Addressed This Visit       Cardiovascular and Mediastinum   Essential hypertension, benign - Primary    Well controlled.  No changes to medicines.  Continue to work on eating a healthy diet and exercise.  Labs drawn today.  Check lipid panel       Relevant Orders   Comprehensive metabolic panel   Lipid panel   CBC with Differential/Platelet     Respiratory   Chronic maxillary sinusitis    2 courses of antibiotic. I was going to order ct scan of sinuses, but has already had with Dr Gaylyn Cheers, so will refer back to him for further evaluation.  Rx: doxycycline 100 mg one twice daily x 30 days.  Continue flonase.        Relevant Medications   doxycycline (VIBRA-TABS) 100 MG tablet   Other Relevant Orders   Ambulatory referral to ENT   Deviated septum    Refer back to Dr. Gaylyn Cheers. Likely needs surgery.       Relevant Orders   Ambulatory referral to ENT     Digestive   RESOLVED: GERD (gastroesophageal reflux disease) (Chronic)     Nervous and Auditory   Cigarette nicotine dependence with nicotine-induced disorder    Encouraged continued cessation! Good Job.          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 Management per specialist.        Rheumatoid arthritis involving multiple sites with positive rheumatoid factor ([place])    Check labs.  The current medical regimen is effective;  continue present plan and medications. Management per specialist.        Other Visit Diagnoses     Need for influenza vaccination       Relevant Orders   Flu Vaccine QUAD High Dose(Fluad) (Completed)     .  Meds ordered this encounter  Medications   doxycycline (VIBRA-TABS) 100 MG tablet    Sig: Take 1 tablet (100 mg total) by mouth 2 (two) times daily.    Dispense:  60 tablet    Refill:  0    Orders Placed This Encounter  Procedures   Flu Vaccine QUAD High Dose(Fluad)   Comprehensive metabolic panel   Lipid panel   CBC with Differential/Platelet   Ambulatory referral to ENT     Follow-up: Return in about 4 months (around 03/13/2022) for awv with KIM. , chronic follow up with me in 6 months. .  An After Visit Summary was printed and given to the patient.  [name], MD [facility] [phone]

## 2021-11-18 ENCOUNTER — Other Ambulatory Visit: Payer: Self-pay

## 2021-11-18 ENCOUNTER — Encounter: Payer: Self-pay | Admitting: Family Medicine

## 2021-11-18 ENCOUNTER — Ambulatory Visit (INDEPENDENT_AMBULATORY_CARE_PROVIDER_SITE_OTHER): Payer: PPO

## 2021-11-18 ENCOUNTER — Ambulatory Visit (INDEPENDENT_AMBULATORY_CARE_PROVIDER_SITE_OTHER): Payer: PPO | Admitting: Family Medicine

## 2021-11-18 VITALS — BP 138/70 | HR 78 | Temp 96.9°F | Resp 16 | Ht 68.0 in | Wt 164.0 lb

## 2021-11-18 DIAGNOSIS — Z23 Encounter for immunization: Secondary | ICD-10-CM

## 2021-11-18 DIAGNOSIS — I10 Essential (primary) hypertension: Secondary | ICD-10-CM

## 2021-11-18 DIAGNOSIS — J320 Chronic maxillary sinusitis: Secondary | ICD-10-CM | POA: Diagnosis not present

## 2021-11-18 DIAGNOSIS — F17219 Nicotine dependence, cigarettes, with unspecified nicotine-induced disorders: Secondary | ICD-10-CM

## 2021-11-18 DIAGNOSIS — K219 Gastro-esophageal reflux disease without esophagitis: Secondary | ICD-10-CM | POA: Diagnosis not present

## 2021-11-18 DIAGNOSIS — J342 Deviated nasal septum: Secondary | ICD-10-CM | POA: Diagnosis not present

## 2021-11-18 DIAGNOSIS — M0579 Rheumatoid arthritis with rheumatoid factor of multiple sites without organ or systems involvement: Secondary | ICD-10-CM | POA: Diagnosis not present

## 2021-11-18 DIAGNOSIS — M1A0490 Idiopathic chronic gout, unspecified hand, without tophus (tophi): Secondary | ICD-10-CM

## 2021-11-18 MED ORDER — DOXYCYCLINE HYCLATE 100 MG PO TABS: 100.0000 mg | ORAL_TABLET | Freq: Two times a day (BID) | ORAL | 0 refills | Status: DC

## 2021-11-18 NOTE — Patient Instructions (Addendum)
Recommend continued cessation of smoking. Good JOB! Refer to ENT.  Doxycycline rx for sinusitis.

## 2021-11-18 NOTE — Assessment & Plan Note (Signed)
Well controlled.  No changes to medicines.  Continue to work on eating a healthy diet and exercise.  Labs drawn today.  Check lipid panel

## 2021-11-18 NOTE — Assessment & Plan Note (Signed)
The current medical regimen is effective;  continue present plan and medications. Management per specialist.   

## 2021-11-18 NOTE — Assessment & Plan Note (Signed)
Encouraged continued cessation! Good Job.

## 2021-11-18 NOTE — Assessment & Plan Note (Signed)
Check labs.  The current medical regimen is effective;  continue present plan and medications. Management per specialist.

## 2021-11-18 NOTE — Assessment & Plan Note (Signed)
Refer back to Dr. Gaylyn Cheers. Likely needs surgery.

## 2021-11-18 NOTE — Assessment & Plan Note (Addendum)
2 courses of antibiotic. I was going to order ct scan of sinuses, but has already had with Dr Gaylyn Cheers, so will refer back to him for further evaluation.  Rx: doxycycline 100 mg one twice daily x 30 days.  Continue flonase.

## 2021-11-19 LAB — CBC WITH DIFFERENTIAL/PLATELET
Basophils Absolute: 0.1 10*3/uL (ref 0.0–0.2)
Basos: 1 %
EOS (ABSOLUTE): 0.2 10*3/uL (ref 0.0–0.4)
Eos: 2 %
Hematocrit: 47.7 % (ref 37.5–51.0)
Hemoglobin: 16.3 g/dL (ref 13.0–17.7)
Immature Grans (Abs): 0 10*3/uL (ref 0.0–0.1)
Immature Granulocytes: 0 %
Lymphocytes Absolute: 4.5 10*3/uL — ABNORMAL HIGH (ref 0.7–3.1)
Lymphs: 44 %
MCH: 32.9 pg (ref 26.6–33.0)
MCHC: 34.2 g/dL (ref 31.5–35.7)
MCV: 96 fL (ref 79–97)
Monocytes Absolute: 1 10*3/uL — ABNORMAL HIGH (ref 0.1–0.9)
Monocytes: 10 %
Neutrophils Absolute: 4.4 10*3/uL (ref 1.4–7.0)
Neutrophils: 43 %
Platelets: 245 10*3/uL (ref 150–450)
RBC: 4.96 x10E6/uL (ref 4.14–5.80)
RDW: 13.3 % (ref 11.6–15.4)
WBC: 10.2 10*3/uL (ref 3.4–10.8)

## 2021-11-19 LAB — COMPREHENSIVE METABOLIC PANEL
ALT: 10 IU/L (ref 0–44)
AST: 18 IU/L (ref 0–40)
Albumin/Globulin Ratio: 1.5 (ref 1.2–2.2)
Albumin: 4.1 g/dL (ref 3.7–4.7)
Alkaline Phosphatase: 95 IU/L (ref 44–121)
BUN/Creatinine Ratio: 14 (ref 10–24)
BUN: 15 mg/dL (ref 8–27)
Bilirubin Total: 0.5 mg/dL (ref 0.0–1.2)
CO2: 23 mmol/L (ref 20–29)
Calcium: 9 mg/dL (ref 8.6–10.2)
Chloride: 103 mmol/L (ref 96–106)
Creatinine, Ser: 1.07 mg/dL (ref 0.76–1.27)
Globulin, Total: 2.8 g/dL (ref 1.5–4.5)
Glucose: 90 mg/dL (ref 70–99)
Potassium: 3.9 mmol/L (ref 3.5–5.2)
Sodium: 138 mmol/L (ref 134–144)
Total Protein: 6.9 g/dL (ref 6.0–8.5)
eGFR: 74 mL/min/{1.73_m2} (ref >59)

## 2021-11-19 LAB — LIPID PANEL
Chol/HDL Ratio: 3 ratio (ref 0.0–5.0)
Cholesterol, Total: 143 mg/dL (ref 100–199)
HDL: 48 mg/dL (ref >39)
LDL Chol Calc (NIH): 78 mg/dL (ref 0–99)
Triglycerides: 90 mg/dL (ref 0–149)
VLDL Cholesterol Cal: 17 mg/dL (ref 5–40)

## 2021-11-19 LAB — CARDIOVASCULAR RISK ASSESSMENT

## 2021-12-03 DIAGNOSIS — R0981 Nasal congestion: Secondary | ICD-10-CM | POA: Diagnosis not present

## 2021-12-03 DIAGNOSIS — J342 Deviated nasal septum: Secondary | ICD-10-CM | POA: Diagnosis not present

## 2021-12-03 DIAGNOSIS — D849 Immunodeficiency, unspecified: Secondary | ICD-10-CM | POA: Diagnosis not present

## 2021-12-03 DIAGNOSIS — J310 Chronic rhinitis: Secondary | ICD-10-CM | POA: Diagnosis not present

## 2021-12-03 DIAGNOSIS — Z86718 Personal history of other venous thrombosis and embolism: Secondary | ICD-10-CM | POA: Diagnosis not present

## 2021-12-03 DIAGNOSIS — M069 Rheumatoid arthritis, unspecified: Secondary | ICD-10-CM | POA: Diagnosis not present

## 2021-12-03 DIAGNOSIS — J343 Hypertrophy of nasal turbinates: Secondary | ICD-10-CM | POA: Diagnosis not present

## 2021-12-03 DIAGNOSIS — J3489 Other specified disorders of nose and nasal sinuses: Secondary | ICD-10-CM | POA: Diagnosis not present

## 2021-12-03 DIAGNOSIS — J449 Chronic obstructive pulmonary disease, unspecified: Secondary | ICD-10-CM | POA: Diagnosis not present

## 2021-12-03 DIAGNOSIS — J329 Chronic sinusitis, unspecified: Secondary | ICD-10-CM | POA: Diagnosis not present

## 2021-12-14 DIAGNOSIS — J329 Chronic sinusitis, unspecified: Secondary | ICD-10-CM | POA: Diagnosis not present

## 2021-12-14 DIAGNOSIS — J310 Chronic rhinitis: Secondary | ICD-10-CM | POA: Diagnosis not present

## 2021-12-16 ENCOUNTER — Other Ambulatory Visit: Payer: Self-pay | Admitting: Family Medicine

## 2021-12-16 DIAGNOSIS — J320 Chronic maxillary sinusitis: Secondary | ICD-10-CM

## 2021-12-16 NOTE — Telephone Encounter (Signed)
Refill sent to pharmacy.   

## 2021-12-21 DIAGNOSIS — J343 Hypertrophy of nasal turbinates: Secondary | ICD-10-CM | POA: Diagnosis not present

## 2021-12-21 DIAGNOSIS — R0981 Nasal congestion: Secondary | ICD-10-CM | POA: Diagnosis not present

## 2021-12-21 DIAGNOSIS — J342 Deviated nasal septum: Secondary | ICD-10-CM | POA: Diagnosis not present

## 2021-12-21 DIAGNOSIS — J300 Vasomotor rhinitis: Secondary | ICD-10-CM | POA: Diagnosis not present

## 2021-12-21 DIAGNOSIS — J320 Chronic maxillary sinusitis: Secondary | ICD-10-CM | POA: Diagnosis not present

## 2022-01-20 ENCOUNTER — Other Ambulatory Visit: Payer: Self-pay | Admitting: Physician Assistant

## 2022-01-24 DIAGNOSIS — M1A09X0 Idiopathic chronic gout, multiple sites, without tophus (tophi): Secondary | ICD-10-CM | POA: Diagnosis not present

## 2022-01-24 DIAGNOSIS — Z79899 Other long term (current) drug therapy: Secondary | ICD-10-CM | POA: Diagnosis not present

## 2022-01-24 DIAGNOSIS — M0579 Rheumatoid arthritis with rheumatoid factor of multiple sites without organ or systems involvement: Secondary | ICD-10-CM | POA: Diagnosis not present

## 2022-02-17 DIAGNOSIS — D120 Benign neoplasm of cecum: Secondary | ICD-10-CM | POA: Diagnosis not present

## 2022-02-17 DIAGNOSIS — Z09 Encounter for follow-up examination after completed treatment for conditions other than malignant neoplasm: Secondary | ICD-10-CM | POA: Diagnosis not present

## 2022-02-17 DIAGNOSIS — Z86010 Personal history of colonic polyps: Secondary | ICD-10-CM | POA: Diagnosis not present

## 2022-02-17 DIAGNOSIS — D124 Benign neoplasm of descending colon: Secondary | ICD-10-CM | POA: Diagnosis not present

## 2022-02-17 LAB — HM COLONOSCOPY

## 2022-03-15 ENCOUNTER — Encounter: Payer: PPO | Admitting: Family Medicine

## 2022-03-15 ENCOUNTER — Ambulatory Visit (INDEPENDENT_AMBULATORY_CARE_PROVIDER_SITE_OTHER): Payer: PPO | Admitting: Family Medicine

## 2022-03-15 VITALS — BP 138/78 | HR 80 | Resp 16 | Ht 67.0 in | Wt 163.8 lb

## 2022-03-15 DIAGNOSIS — Z122 Encounter for screening for malignant neoplasm of respiratory organs: Secondary | ICD-10-CM

## 2022-03-15 DIAGNOSIS — Z0000 Encounter for general adult medical examination without abnormal findings: Secondary | ICD-10-CM

## 2022-03-15 DIAGNOSIS — F17200 Nicotine dependence, unspecified, uncomplicated: Secondary | ICD-10-CM | POA: Diagnosis not present

## 2022-03-15 NOTE — Progress Notes (Signed)
Subjective:   Gerald Odom is a 71 y.o. male who presents for Medicare Annual/Subsequent preventive examination.  This wellness visit is conducted by a nurse.  The patient's medications were reviewed and reconciled since the patient's last visit.  History details were provided by the patient.  The history appears to be reliable.    Medical History: Patient history and Family history was reviewed  Medications, Allergies, and preventative health maintenance was reviewed and updated.  Cardiac Risk Factors include: advanced age (>62mn, >>66women);smoking/ tobacco exposure     Objective:    Today's Vitals   03/15/22 0849  BP: 138/78  Pulse: 80  Resp: 16  SpO2: 92%  Weight: 163 lb 12.8 oz (74.3 kg)  Height: '5\' 7"'$  (1.702 m)  PainSc: 0-No pain   Body mass index is 25.65 kg/m.     03/10/2021    8:11 AM 01/30/2020    9:02 AM 07/05/2016   11:34 AM 06/29/2016    9:37 AM 05/16/2016   11:37 AM 05/14/2016    3:01 PM  Advanced Directives  Does Patient Have a Medical Advance Directive? Yes No No No No No  Type of AParamedicof AEustaceLiving will       Would patient like information on creating a medical advance directive?  Yes (Inpatient - patient defers creating a medical advance directive at this time - Information given)  Yes - Educational materials given No - patient declined information No - patient declined information    Current Medications (verified) Outpatient Encounter Medications as of 03/15/2022  Medication Sig   allopurinol (ZYLOPRIM) 300 MG tablet Take 1 tablet (300 mg total) by mouth daily before breakfast.   Budeson-Glycopyrrol-Formoterol (BREZTRI AEROSPHERE) 160-9-4.8 MCG/ACT AERO Inhale 2 puffs into the lungs 2 (two) times daily.   celecoxib (CELEBREX) 100 MG capsule Take 200 mg by mouth.   etanercept (ENBREL) 50 MG/ML injection Inject 50 mg into the skin once a week.   fluticasone (FLONASE) 50 MCG/ACT nasal spray Place 2 sprays into both  nostrils daily.   folic acid (FOLVITE) 1 MG tablet Take by mouth.   lisinopril (ZESTRIL) 5 MG tablet TAKE 1 TABLET TWICE A DAY   methotrexate (RHEUMATREX) 2.5 MG tablet Take 15 mg by mouth once a week.   [DISCONTINUED] cetirizine (ZYRTEC) 10 MG tablet Take 1 tablet (10 mg total) by mouth daily. (Patient not taking: Reported on 03/15/2022)   [DISCONTINUED] doxycycline (VIBRA-TABS) 100 MG tablet TAKE 1 TABLET BY MOUTH TWICE A DAY (Patient not taking: Reported on 03/15/2022)   No facility-administered encounter medications on file as of 03/15/2022.    Allergies (verified) Patient has no known allergies.   History: Past Medical History:  Diagnosis Date   Acute cholangitis 05/14/2016   Arthritis    Rheumatoid arthritis   Choledocholithiasis 05/16/2016   DVT (deep venous thrombosis) ([facility] 02/10/2021       Gallstone pancreatitis 05/14/2016   Gout    Hearing loss of left ear due to cerumen impaction 02/09/2020   Hyperlipidemia    Hypertension    [facility]  Pneumonia 05/2016   Past Surgical History:  Procedure Laterality Date   CHOLECYSTECTOMY     CHOLECYSTECTOMY N/A 07/04/2016   Procedure: LAPAROSCOPIC CHOLECYSTECTOMY WITH INTRAOPERATIVE CHOLANGIOGRAM;  Surgeon: TJackolyn Confer MD;  Location: [facility]  Service: General;  Laterality: N/A;   ERCP N/A 05/16/2016   Procedure: ENDOSCOPIC RETROGRADE CHOLANGIOPANCREATOGRAPHY (ERCP);  Surgeon: HDoran Stabler MD;  [facility];  Service: Endoscopy;  Laterality: N/A;   KNEE SURGERY Left 1997   Family History  Problem Relation Age of Onset   Anxiety disorder Father    Social History   Socioeconomic History   Marital status: Married    Spouse name: Gerald Odom   Number of children: Not on file   Years of education: Not on file   Highest education level: Not on file  Occupational History   Not on file  Tobacco Use   Smoking status: Every Day    Packs/day: 1.50    Years: 53.00    Pack years: 79.50    Types:  Cigarettes    Start date: 10/17/1968   Smokeless tobacco: Never   Tobacco comments:    CT Screening ordered 03/15/22  Vaping Use   Vaping Use: Never used  Substance and Sexual Activity   Alcohol use: Yes    Alcohol/week: 1.0 standard drink    Types: 1 Standard drinks or equivalent per week    Comment: ocasionally   Drug use: Never   Sexual activity: Yes    Partners: Female  Other Topics Concern   Not on file  Social History Narrative   Not on file   Social Determinants of Health   Financial Resource Strain: Not on file  Food Insecurity: No Food Insecurity   Worried About Charity fundraiser in the Last Year: Never true   Ran Out of Food in the Last Year: Never true  Transportation Needs: No Transportation Needs   Lack of Transportation (Medical): No   Lack of Transportation (Non-Medical): No  Physical Activity: Not on file  Stress: Not on file  Social Connections: Not on file    Tobacco Counseling Ready to quit: No Counseling given: Yes Tobacco comments: CT Screening ordered 03/15/22   Clinical Intake: Pre-visit preparation completed: Yes Pain : No/denies pain Pain Score: 0-No pain   BMI - recorded: 25.65 Nutritional Status: BMI 25 -29 Overweight Nutritional Risks: None Diabetes: No How often do you need to have someone help you when you read instructions, pamphlets, or other written materials from your doctor or pharmacy?: 1 - Never Interpreter Needed?: No    Activities of Daily Living    03/15/2022    9:14 AM  In your present state of health, do you have any difficulty performing the following activities:  Hearing? 0  Vision? 0  Difficulty concentrating or making decisions? 0  Walking or climbing stairs? 0  Dressing or bathing? 0  Doing errands, shopping? 0  Preparing Food and eating ? N  Using the Toilet? N  In the past six months, have you accidently leaked urine? N  Do you have problems with loss of bowel control? N  Managing your Medications? N   Managing your Finances? N  Housekeeping or managing your Housekeeping? N    Patient Care Team: [name], MD as PCP - General (Family Medicine) Burnice Logan, [facility] (Inactive) as Pharmacist (Pharmacist) Quintin Alto, MD as Consulting Physician (Rheumatology)     Assessment:   This is a routine wellness examination for [place].  Hearing/Vision screen No results found.  Dietary issues and exercise activities discussed: Current Exercise Habits: The patient does not participate in regular exercise at present (works outside around the farm daily)  Depression Screen    03/15/2022    9:12 AM 11/18/2021    8:04 AM 03/10/2021    8:12 AM 02/10/2021    1:56 PM 01/30/2020    8:29 AM  PHQ 2/9 Scores  PHQ - 2 Score 0 0 0 0 0    Fall Risk    03/15/2022    9:14 AM 11/18/2021    8:03 AM 11/04/2021   11:13 AM 09/01/2021    8:35 AM 03/10/2021    8:12 AM  Fall Risk   Falls in the past year? 0 0 0 0 0  Number falls in past yr: 0 0 0 0 0  Injury with Fall? 0 0 0 0 0  Risk for fall due to : No Fall Risks  No Fall Risks No Fall Risks No Fall Risks  Follow up Falls evaluation completed;Education provided Falls evaluation completed Falls evaluation completed Falls evaluation completed Falls evaluation completed    [place]:  Any stairs in or around the home? No  If so, are there any without handrails? No  Home free of loose throw rugs in walkways, pet beds, electrical cords, etc? Yes  Adequate lighting in your home to reduce risk of falls? Yes   ASSISTIVE DEVICES UTILIZED TO PREVENT FALLS:  Life alert? No  Use of a cane, walker or w/c? No  Grab bars in the bathroom? No  Shower chair or bench in shower? Yes  Elevated toilet seat or a handicapped toilet? No   Gait steady and fast without use of assistive device  Cognitive Function:    03/15/2022    9:15 AM  MMSE - Mini Mental State Exam  Orientation to time 5  Orientation to Place 5  Registration  3  Attention/ Calculation 5  Recall 3  Language- name 2 objects 2  Language- repeat 1  Language- follow 3 step command 3  Language- read & follow direction 1  Write a sentence 1  Copy design 1  Total score 30        Immunizations Immunization History  Administered Date(s) Administered   Fluad Quad(high Dose 65+) 07/31/2020, 11/18/2021   Influenza-Unspecified 06/11/2019   PFIZER(Purple Top)SARS-COV-2 Vaccination 05/16/2020, 06/13/2020, 12/17/2020   Pfizer Covid-19 Vaccine Bivalent Booster 15yr & up 11/18/2021   Pneumococcal Conjugate-13 09/22/2015   Pneumococcal Polysaccharide-23 09/17/2013, 03/10/2021   Td 01/02/2019   Tdap 10/25/2011    TDAP status: Up to date  Flu Vaccine status: Up to date  Pneumococcal vaccine status: Up to date  Covid-19 vaccine status: Completed vaccines  Qualifies for Shingles Vaccine? Yes   Zostavax completed No   Shingrix Completed?: No.    Education has been provided regarding the importance of this vaccine. Patient has been advised to call insurance company to determine out of pocket expense if they have not yet received this vaccine. Advised may also receive vaccine at local pharmacy or Health Dept. Verbalized acceptance and understanding.  Screening Tests Health Maintenance  Topic Date Due   Zoster Vaccines- Shingrix (1 of 2) Never done   INFLUENZA VACCINE  05/17/2022   TETANUS/TDAP  01/01/2029   COLONOSCOPY (Pts 45-451yrInsurance coverage will need to be confirmed)  02/18/2032   Pneumonia Vaccine 71Years old  Completed   COVID-19 Vaccine  Completed   HPV VACCINES  Aged Out   Hepatitis C Screening  Discontinued    Health Maintenance  Health Maintenance Due  Topic Date Due   Zoster Vaccines- Shingrix (1 of 2) Never done    Colorectal cancer screening: Type of screening: Colonoscopy. Completed 02/17/22. Repeat every 10 years  Lung Cancer Screening: (Low Dose CT Chest recommended if Age 73-80ears, 30 pack-year currently  smoking  OR have quit w/in 15years.) does qualify.   Lung Cancer Screening Referral: Referral placed for [facility]  Additional Screening:  Vision Screening: Recommended annual ophthalmology exams for early detection of glaucoma and other disorders of the eye. Is the patient up to date with their annual eye exam?  Yes   Dental Screening: Recommended annual dental exams for proper oral hygiene     Plan:    1- CT Lung Cancer Screening ordered 2- Shingles Vaccine recommended 3- Tobacco Use discussed, reviewed ways to quit and health benefits of quitting  I have personally reviewed and noted the following in the patient's chart:   Medical and social history Use of alcohol, tobacco or illicit drugs  Current medications and supplements including opioid prescriptions. Patient is not currently taking opioid prescriptions. Functional ability and status Nutritional status Physical activity Advanced directives List of other physicians Hospitalizations, surgeries, and ER visits in previous 12 months Vitals Screenings to include cognitive, depression, and falls Referrals and appointments  In addition, I have reviewed and discussed with patient certain preventive protocols, quality metrics, and best practice recommendations. A written personalized care plan for preventive services as well as general preventive health recommendations were provided to patient.     Erie Noe, LPN   2/83/1517

## 2022-03-15 NOTE — Patient Instructions (Signed)
Gerald Odom , Thank you for taking time to come for your Medicare Wellness Visit. I appreciate your ongoing commitment to your health goals. Please review the following plan we discussed and let me know if I can assist you in the future.   Screening recommendations/referrals: Recommended yearly ophthalmology/optometry visit for glaucoma screening and checkup Recommended yearly dental visit for hygiene and checkup  Vaccinations: Influenza vaccine: Due Fall 2023 Pneumococcal vaccine: Complete Tdap vaccine: Due March 2030 Shingles vaccine: Due - can get at most local pharmacies    Advanced directives: bring a copy for your medical record  Next appointment: 05/19/22 @ 8:00 am with Dr Tobie Poet  Preventive Care 65 Years and Older, Male Preventive care refers to lifestyle choices and visits with your health care provider that can promote health and wellness. What does preventive care include? A yearly physical exam. This is also called an annual well check. Dental exams once or twice a year. Routine eye exams. Ask your health care provider how often you should have your eyes checked. Personal lifestyle choices, including: Daily care of your teeth and gums. Regular physical activity. Eating a healthy diet. Avoiding tobacco and drug use. Limiting alcohol use. Practicing safe sex. Taking low doses of aspirin every day. Taking vitamin and mineral supplements as recommended by your health care provider. What happens during an annual well check? The services and screenings done by your health care provider during your annual well check will depend on your age, overall health, lifestyle risk factors, and family history of disease. Counseling  Your health care provider may ask you questions about your: Alcohol use. Tobacco use. Drug use. Emotional well-being. Home and relationship well-being. Sexual activity. Eating habits. History of falls. Memory and ability to understand (cognition). Work and  work Statistician. Screening  You may have the following tests or measurements: Height, weight, and BMI. Blood pressure. Lipid and cholesterol levels. These may be checked every 5 years, or more frequently if you are over 29 years old. Skin check. Lung cancer screening. You may have this screening every year starting at age 74 if you have a 30-pack-year history of smoking and currently smoke or have quit within the past 15 years. Fecal occult blood test (FOBT) of the stool. You may have this test every year starting at age 73. Flexible sigmoidoscopy or colonoscopy. You may have a sigmoidoscopy every 5 years or a colonoscopy every 10 years starting at age 73. Prostate cancer screening. Recommendations will vary depending on your family history and other risks. Hepatitis C blood test. Hepatitis B blood test. Sexually transmitted disease (STD) testing. Diabetes screening. This is done by checking your blood sugar (glucose) after you have not eaten for a while (fasting). You may have this done every 1-3 years. Abdominal aortic aneurysm (AAA) screening. You may need this if you are a current or former smoker. Osteoporosis. You may be screened starting at age 73 if you are at high risk. Talk with your health care provider about your test results, treatment options, and if necessary, the need for more tests. Vaccines  Your health care provider may recommend certain vaccines, such as: Influenza vaccine. This is recommended every year. Tetanus, diphtheria, and acellular pertussis (Tdap, Td) vaccine. You may need a Td booster every 10 years. Zoster vaccine. You may need this after age 57. Pneumococcal 13-valent conjugate (PCV13) vaccine. One dose is recommended after age 53. Pneumococcal polysaccharide (PPSV23) vaccine. One dose is recommended after age 31. Talk to your health care provider about  which screenings and vaccines you need and how often you need them. This information is not intended to  replace advice given to you by your health care provider. Make sure you discuss any questions you have with your health care provider. Document Released: 10/30/2015 Document Revised: 06/22/2016 Document Reviewed: 08/04/2015 Elsevier Interactive Patient Education  2017 [place] Prevention in the Home Falls can cause injuries. They can happen to people of all ages. There are many things you can do to make your home safe and to help prevent falls. What can I do on the outside of my home? Regularly fix the edges of walkways and driveways and fix any cracks. Remove anything that might make you trip as you walk through a door, such as a raised step or threshold. Trim any bushes or trees on the path to your home. Use bright outdoor lighting. Clear any walking paths of anything that might make someone trip, such as rocks or tools. Regularly check to see if handrails are loose or broken. Make sure that both sides of any steps have handrails. Any raised decks and porches should have guardrails on the edges. Have any leaves, snow, or ice cleared regularly. Use sand or salt on walking paths during winter. Clean up any spills in your garage right away. This includes oil or grease spills. What can I do in the bathroom? Use night lights. Install grab bars by the toilet and in the tub and shower. Do not use towel bars as grab bars. Use non-skid mats or decals in the tub or shower. If you need to sit down in the shower, use a plastic, non-slip stool. Keep the floor dry. Clean up any water that spills on the floor as soon as it happens. Remove soap buildup in the tub or shower regularly. Attach bath mats securely with double-sided non-slip rug tape. Do not have throw rugs and other things on the floor that can make you trip. What can I do in the bedroom? Use night lights. Make sure that you have a light by your bed that is easy to reach. Do not use any sheets or blankets that are too big for  your bed. They should not hang down onto the floor. Have a firm chair that has side arms. You can use this for support while you get dressed. Do not have throw rugs and other things on the floor that can make you trip. What can I do in the kitchen? Clean up any spills right away. Avoid walking on wet floors. Keep items that you use a lot in easy-to-reach places. If you need to reach something above you, use a strong step stool that has a grab bar. Keep electrical cords out of the way. Do not use floor polish or wax that makes floors slippery. If you must use wax, use non-skid floor wax. Do not have throw rugs and other things on the floor that can make you trip. What can I do with my stairs? Do not leave any items on the stairs. Make sure that there are handrails on both sides of the stairs and use them. Fix handrails that are broken or loose. Make sure that handrails are as long as the stairways. Check any carpeting to make sure that it is firmly attached to the stairs. Fix any carpet that is loose or worn. Avoid having throw rugs at the top or bottom of the stairs. If you do have throw rugs, attach them to the floor with  carpet tape. Make sure that you have a light switch at the top of the stairs and the bottom of the stairs. If you do not have them, ask someone to add them for you. What else can I do to help prevent falls? Wear shoes that: Do not have high heels. Have rubber bottoms. Are comfortable and fit you well. Are closed at the toe. Do not wear sandals. If you use a stepladder: Make sure that it is fully opened. Do not climb a closed stepladder. Make sure that both sides of the stepladder are locked into place. Ask someone to hold it for you, if possible. Clearly mark and make sure that you can see: Any grab bars or handrails. First and last steps. Where the edge of each step is. Use tools that help you move around (mobility aids) if they are needed. These  include: Canes. Walkers. Scooters. Crutches. Turn on the lights when you go into a dark area. Replace any light bulbs as soon as they burn out. Set up your furniture so you have a clear path. Avoid moving your furniture around. If any of your floors are uneven, fix them. If there are any pets around you, be aware of where they are. Review your medicines with your doctor. Some medicines can make you feel dizzy. This can increase your chance of falling. Ask your doctor what other things that you can do to help prevent falls. This information is not intended to replace advice given to you by your health care provider. Make sure you discuss any questions you have with your health care provider. Document Released: 07/30/2009 Document Revised: 03/10/2016 Document Reviewed: 11/07/2014 Elsevier Interactive Patient Education  2017 [employer].

## 2022-03-22 DIAGNOSIS — Z122 Encounter for screening for malignant neoplasm of respiratory organs: Secondary | ICD-10-CM | POA: Diagnosis not present

## 2022-03-22 DIAGNOSIS — Z87891 Personal history of nicotine dependence: Secondary | ICD-10-CM | POA: Diagnosis not present

## 2022-03-22 DIAGNOSIS — F17210 Nicotine dependence, cigarettes, uncomplicated: Secondary | ICD-10-CM | POA: Diagnosis not present

## 2022-03-24 ENCOUNTER — Telehealth: Payer: Self-pay

## 2022-03-24 NOTE — Progress Notes (Signed)
Chronic Care Management Pharmacy Assistant   Name: Gerald Odom  MRN: [id number] DOB: [birth year]   Reason for Encounter: Disease State call for HTN   Recent office visits:  03/15/22 [name] MD. Seen for Medicare Annual Wellness. No med changes.  11/18/21 [name] MD. Seen for routine visit. Started on Doxycycline Hyclate '100mg'$ . Referral to ENT.   11/04/21 Jerrell Belfast NP. Seen for sinusitis. Started on Cetrizine HCI '10mg'$  daily.   Recent consult visits:  01/24/22 (Rheumatology) Shirlyn Goltz MD. Seen for Rheumatoid Arthritis. Ordered Methotrexate 2.'5mg'$ .   10/25/21 (Rheumatology) Ephriam Jenkins, Khandu MD. Orders Only. Ordered Methotrexate 2.'5mg'$  and Folic Acid '1mg'$ .   Hospital visits:  None in previous 6 months  Medications: Outpatient Encounter Medications as of 03/24/2022  Medication Sig   allopurinol (ZYLOPRIM) 300 MG tablet Take 1 tablet (300 mg total) by mouth daily before breakfast.   Budeson-Glycopyrrol-Formoterol (BREZTRI AEROSPHERE) 160-9-4.8 MCG/ACT AERO Inhale 2 puffs into the lungs 2 (two) times daily.   celecoxib (CELEBREX) 100 MG capsule Take 200 mg by mouth.   etanercept (ENBREL) 50 MG/ML injection Inject 50 mg into the skin once a week.   fluticasone (FLONASE) 50 MCG/ACT nasal spray Place 2 sprays into both nostrils daily.   folic acid (FOLVITE) 1 MG tablet Take by mouth.   lisinopril (ZESTRIL) 5 MG tablet TAKE 1 TABLET TWICE A DAY   methotrexate (RHEUMATREX) 2.5 MG tablet Take 15 mg by mouth once a week.   No facility-administered encounter medications on file as of 03/24/2022.     Recent Office Vitals: BP Readings from Last 3 Encounters:  03/15/22 138/78  11/18/21 138/70  11/04/21 118/62   Pulse Readings from Last 3 Encounters:  03/15/22 80  11/18/21 78  11/04/21 73    Wt Readings from Last 3 Encounters:  03/15/22 163 lb 12.8 oz (74.3 kg)  11/18/21 164 lb (74.4 kg)  11/04/21 163 lb (73.9 kg)     Kidney Function Lab Results  Component  Value Date/Time   CREATININE 1.07 11/18/2021 08:44 AM   CREATININE 1.13 12/01/2020 08:28 AM   GFRNONAA 65 12/01/2020 08:28 AM   GFRAA 75 12/01/2020 08:28 AM       Latest Ref Rng & Units 11/18/2021    8:44 AM 12/01/2020    8:28 AM 07/05/2016    3:27 PM  BMP  Glucose 70 - 99 mg/dL 90  87  123   BUN 8 - 27 mg/dL '15  13  7   '$ Creatinine 0.76 - 1.27 mg/dL 1.07  1.13  0.87   BUN/Creat Ratio 10 - '24 14  12    '$ Sodium 134 - 144 mmol/L 138  140  140   Potassium 3.5 - 5.2 mmol/L 3.9  4.2  3.8   Chloride 96 - 106 mmol/L 103  106  107   CO2 20 - 29 mmol/L '23  19  24   '$ Calcium 8.6 - 10.2 mg/dL 9.0  9.3  9.3      Current antihypertensive regimen:  Lisinopril '5mg'$  twice a day  Patient verbally confirms he is taking the above medications as directed. Yes  How often are you checking your Blood Pressure? infrequently  Current home BP readings: Pt has not checked readings at home but his latest office readings : 03/15/22 138/78, 11/18/21 138/70  Wrist or arm cuff:Arm  Caffeine intake:1-2 cups coffee  Salt intake:Limited   Any readings above 180/120? No  What recent interventions/DTPs have been made by any provider to  improve Blood Pressure control since last CPP Visit: No changes have been noted   Any recent hospitalizations or ED visits since last visit with CPP? No  What diet changes have been made to improve Blood Pressure Control?  No diet changes   What exercise is being done to improve your Blood Pressure Control?  [place] he is always staying active and doing things around the yard   Adherence Review: Is the patient currently on ACE/ARB medication? Yes Does the patient have >5 day gap between last estimated fill dates? CPP to review  Care Gaps: Last annual wellness visit?03/15/22  Star Rating Drugs:  Medication:  Last Fill: Day Supply  Lisinopril   01/20/22/10/11/21 Cassel, Bruce Pharmacist Assistant  [phone]

## 2022-03-25 ENCOUNTER — Other Ambulatory Visit: Payer: Self-pay | Admitting: Family Medicine

## 2022-03-25 DIAGNOSIS — R911 Solitary pulmonary nodule: Secondary | ICD-10-CM

## 2022-03-25 MED ORDER — ROSUVASTATIN CALCIUM 10 MG PO TABS: 10.0000 mg | ORAL_TABLET | Freq: Every day | ORAL | 0 refills | Status: DC

## 2022-03-28 ENCOUNTER — Other Ambulatory Visit: Payer: Self-pay

## 2022-03-28 DIAGNOSIS — J418 Mixed simple and mucopurulent chronic bronchitis: Secondary | ICD-10-CM

## 2022-03-28 MED ORDER — BREZTRI AEROSPHERE 160-9-4.8 MCG/ACT IN AERO: 2.0000 | INHALATION_SPRAY | Freq: Two times a day (BID) | RESPIRATORY_TRACT | 5 refills | Status: DC

## 2022-03-29 ENCOUNTER — Other Ambulatory Visit: Payer: Self-pay | Admitting: Family Medicine

## 2022-03-29 DIAGNOSIS — R911 Solitary pulmonary nodule: Secondary | ICD-10-CM

## 2022-04-28 ENCOUNTER — Other Ambulatory Visit: Payer: Self-pay | Admitting: Nurse Practitioner

## 2022-04-28 DIAGNOSIS — M1A0490 Idiopathic chronic gout, unspecified hand, without tophus (tophi): Secondary | ICD-10-CM

## 2022-05-04 ENCOUNTER — Ambulatory Visit (INDEPENDENT_AMBULATORY_CARE_PROVIDER_SITE_OTHER): Payer: PPO

## 2022-05-04 DIAGNOSIS — M1A0490 Idiopathic chronic gout, unspecified hand, without tophus (tophi): Secondary | ICD-10-CM

## 2022-05-04 DIAGNOSIS — I10 Essential (primary) hypertension: Secondary | ICD-10-CM

## 2022-05-04 DIAGNOSIS — Z720 Tobacco use: Secondary | ICD-10-CM

## 2022-05-04 NOTE — Progress Notes (Signed)
Chronic Care Management Pharmacy Note  05/04/2022 Name:  Gerald Odom MRN:  [id number] DOB:  1951-06-16  Summary: Pleasant 71 year old male presents for f/u CCM visit. He used to work for a tobacco company for many years  Recommendations: N/A   Subjective: Gerald Odom is an 71 y.o. year old male who is a primary patient of Cox, Kirsten, MD.  The CCM team was consulted for assistance with disease management and care coordination needs.    Engaged with patient by telephone for follow up visit in response to provider referral for pharmacy case management and/or care coordination services.   Consent to Services:  The patient was given information about Chronic Care Management services, agreed to services, and gave verbal consent prior to initiation of services.  Please see initial visit note for detailed documentation.   Patient Care Team: [name], MD as PCP - General (Family Medicine) Quintin Alto, MD as Consulting Physician (Rheumatology) Lane Hacker, [facility] (Pharmacist)  Recent office visits:  03/15/22 [name] MD. Seen for Medicare Annual Wellness. No med changes.   11/18/21 [name] MD. Seen for routine visit. Started on Doxycycline Hyclate 1672m. Referral to ENT.    11/04/21 HJerrell BelfastNP. Seen for sinusitis. Started on Cetrizine HCI 160mdaily.    Recent consult visits:  01/24/22 (Rheumatology) PaShirlyn GoltzD. Seen for Rheumatoid Arthritis. Ordered Methotrexate 2.72m72m   10/25/21 (Rheumatology) PatEphriam Jenkinshandu MD. Orders Only. Ordered Methotrexate 2.56.1UOd Folic Acid 1mg30m  Hospital visits:  None in previous 6 months Objective:  Lab Results  Component Value Date   CREATININE 1.07 11/18/2021   BUN 15 11/18/2021   GFRNONAA 65 12/01/2020   GFRAA 75 12/01/2020   NA 138 11/18/2021   K 3.9 11/18/2021   CALCIUM 9.0 11/18/2021   CO2 23 11/18/2021   GLUCOSE 90 11/18/2021    No results found for: "HGBA1C", "FRUCTOSAMINE", "GFR",  "MICROALBUR"  Last diabetic Eye exam: No results found for: "HMDIABEYEEXA"  Last diabetic Foot exam: No results found for: "HMDIABFOOTEX"   Lab Results  Component Value Date   CHOL 143 11/18/2021   HDL 48 11/18/2021   LDLCALC 78 11/18/2021   TRIG 90 11/18/2021   CHOLHDL 3.0 11/18/2021       Latest Ref Rng & Units 11/18/2021    8:44 AM 12/01/2020    8:28 AM 07/05/2016    3:27 PM  Hepatic Function  Total Protein 6.0 - 8.5 g/dL 6.9  6.8  6.8   Albumin 3.7 - 4.7 g/dL 4.1  4.1  3.7   AST 0 - 40 IU/L 18  14  39   ALT 0 - 44 IU/L 10  12  49   Alk Phosphatase 44 - 121 IU/L 95  90  69   Total Bilirubin 0.0 - 1.2 mg/dL 0.5  0.4  0.8     Lab Results  Component Value Date/Time   TSH 1.880 01/30/2020 09:23 AM       Latest Ref Rng & Units 11/18/2021    8:44 AM 12/01/2020    8:28 AM 07/05/2016    3:27 PM  CBC  WBC 3.4 - 10.8 x10E3/uL 10.2  9.5  18.4   Hemoglobin 13.0 - 17.7 g/dL 16.3  16.4  14.2   Hematocrit 37.5 - 51.0 % 47.7  47.6  42.0   Platelets 150 - 450 x10E3/uL 245  248  243     No results found for: "VD25OH"  Clinical ASCVD: No  The 10-year ASCVD risk score (Arnett DK, et al., 2019) is: 28.5%   Values used to calculate the score:     Age: 71 years     Sex: Male     Is Non-Hispanic African American: No     Diabetic: No     Tobacco smoker: Yes     Systolic Blood Pressure: 915 mmHg     Is BP treated: Yes     HDL Cholesterol: 48 mg/dL     Total Cholesterol: 143 mg/dL       03/15/2022    9:12 AM 11/18/2021    8:04 AM 03/10/2021    8:12 AM  Depression screen PHQ 2/9  Decreased Interest 0 0 0  Down, Depressed, Hopeless 0 0 0  PHQ - 2 Score 0 0 0     Other: (CHADS2VASc if Afib, MMRC or CAT for COPD, ACT, DEXA)  Social History   Tobacco Use  Smoking Status Every Day   Packs/day: 0.50   Years: 53.00   Total pack years: 26.50   Types: Cigarettes   Start date: 10/17/1968  Smokeless Tobacco Never  Tobacco Comments   CT Screening ordered 03/15/22   BP Readings from  Last 3 Encounters:  03/15/22 138/78  11/18/21 138/70  11/04/21 118/62   Pulse Readings from Last 3 Encounters:  03/15/22 80  11/18/21 78  11/04/21 73   Wt Readings from Last 3 Encounters:  03/15/22 163 lb 12.8 oz (74.3 kg)  11/18/21 164 lb (74.4 kg)  11/04/21 163 lb (73.9 kg)   BMI Readings from Last 3 Encounters:  03/15/22 25.65 kg/m  11/18/21 24.94 kg/m  11/04/21 25.53 kg/m    Assessment/Interventions: Review of patient past medical history, allergies, medications, health status, including review of consultants reports, laboratory and other test data, was performed as part of comprehensive evaluation and provision of chronic care management services.   SDOH:  (Social Determinants of Health) assessments and interventions performed: Yes SDOH Interventions    Flowsheet Row Most Recent Value  SDOH Interventions   Financial Strain Interventions Intervention Not Indicated      SDOH Screenings   Alcohol Screen: Low Risk  (03/15/2022)   Alcohol Screen    Last Alcohol Screening Score (AUDIT): 1  Depression (PHQ2-9): Low Risk  (03/15/2022)   Depression (PHQ2-9)    PHQ-2 Score: 0  Financial Resource Strain: Low Risk  (05/04/2022)   Overall Financial Resource Strain (CARDIA)    Difficulty of Paying Living Expenses: Not hard at all  Food Insecurity: No Food Insecurity (03/15/2022)   Hunger Vital Sign    Worried About Running Out of Food in the Last Year: Never true    Ran Out of Food in the Last Year: Never true  Housing: Low Risk  (03/15/2022)   Housing    Last Housing Risk Score: 0  Physical Activity: Not on file  Social Connections: Not on file  Stress: Not on file  Tobacco Use: High Risk (05/04/2022)   Patient History    Smoking Tobacco Use: Every Day    Smokeless Tobacco Use: Never    Passive Exposure: Not on file  Transportation Needs: No Transportation Needs (03/15/2022)   PRAPARE - Transportation    Lack of Transportation (Medical): No    Lack of Transportation  (Non-Medical): No    CCM Care Plan  No Known Allergies  Medications Reviewed Today     Reviewed by Lane Hacker, [facility] (Pharmacist) on 05/04/22 at [phone]  Med List  Status: <None>   Medication Order Taking? Sig Documenting Provider Last Dose Status Informant  allopurinol (ZYLOPRIM) 300 MG tablet [id number]  TAKE 1 TABLET BY MOUTH DAILY BEFORE BREAKFAST. Rip Harbour, NP  Active   Budeson-Glycopyrrol-Formoterol (BREZTRI AEROSPHERE) 160-9-4.8 MCG/ACT Hollie Salk [id number]  Inhale 2 puffs into the lungs 2 (two) times daily. Cox, Kirsten, MD  Active   celecoxib (CELEBREX) 100 MG capsule [id number]  Take 200 mg by mouth. [provider]  Active   etanercept (ENBREL) 50 MG/ML injection [id number]  Inject 50 mg into the skin once a week. [provider]  Active   fluticasone (FLONASE) 50 MCG/ACT nasal spray [id number]  Place 2 sprays into both nostrils daily. Rip Harbour, NP  Active   folic acid (FOLVITE) 1 MG tablet [id number]  Take by mouth. [provider]  Active   lisinopril (ZESTRIL) 5 MG tablet [id number]  TAKE 1 TABLET TWICE A DAY Marge Duncans, PA-C  Active   methotrexate (RHEUMATREX) 2.5 MG tablet [id number]  Take 15 mg by mouth once a week. [provider]  Active   rosuvastatin (CRESTOR) 10 MG tablet [id number]  Take 1 tablet (10 mg total) by mouth daily. 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 05/10/2017   Rheumatoid arthritis involving multiple sites with positive rheumatoid factor ([place]) 05/10/2017    Immunization History  Administered Date(s) Administered   Fluad Quad(high Dose 65+) 07/31/2020, 11/18/2021   Influenza-Unspecified 06/11/2019   PFIZER(Purple  Top)SARS-COV-2 Vaccination 05/16/2020, 06/13/2020, 12/17/2020   Pfizer Covid-19 Vaccine Bivalent Booster 72yr & up 11/18/2021   Pneumococcal Conjugate-13 09/22/2015   Pneumococcal Polysaccharide-23 09/17/2013, 03/10/2021   Td 01/02/2019   Tdap 10/25/2011    Conditions to be addressed/monitored:  Hypertension, GERD, and Tobacco use  [place] Updates made by KLane Hacker RPH since 05/04/2022 12:00 AM     Problem: Htn, COPD, smoking cessation   Priority: High  Onset Date: 04/30/2021     Long-Range Goal: Disease State Management   Start Date: 04/30/2021  Expected End Date: 04/30/2022  Recent Progress: On track  Priority: High  Note:   Current Barriers:  Unable to achieve control of smoking cessation   Pharmacist Clinical Goal(s):  Patient will achieve control of tobacco use as evidenced by working on smoking cessation through collaboration with PharmD and provider.   Interventions: 1:1 collaboration with CRochel Brome MD regarding development and update of comprehensive plan of care as evidenced by provider attestation and co-signature Inter-disciplinary care team collaboration (see longitudinal plan of care) Comprehensive medication review performed; medication list updated in electronic medical record  Hypertension (BP goal <140/90) -Controlled -Current treatment: Lisinopril 5 mg twice daily Appropriate, Effective, Safe, Accessible -Medications previously tried: none reported -Current home readings: not checking regularly -Current dietary habits: doesn't follow specific diet. Doesn't add salt to food.  -Current exercise habits: Stays active working in yard.  -Denies hypotensive/hypertensive symptoms -Educated on BP goals and benefits of medications for prevention of heart attack, stroke and kidney damage; Daily salt intake goal < 2300 mg; Exercise goal of 150 minutes per week; -Counseled to monitor BP at home weekly, document, and provide  log at future appointments -  Counseled on diet and exercise extensively Recommended to continue current medication  COPD (Goal: control symptoms and prevent exacerbations) -Controlled -Current treatment  Breztri 2 puffs twice daily  -Medications previously tried: none repor -MMRC/CAT score:  July 2022: 4 (low)     No data to display         Tobacco Use: High Risk (05/04/2022)   Patient History    Smoking Tobacco Use: Every Day    Smokeless Tobacco Use: Never    Passive Exposure: Not on file  -Pulmonary function testing: not available in chart  -Exacerbations requiring treatment in last 6 months: none reported -Patient reports consistent use of maintenance inhaler -Frequency of rescue inhaler use: n/a -Counseled on Benefits of consistent maintenance inhaler use -Counseled on option of patient assistance for Breztri if cost becomes too expensive. Patient reports breathing well controlled.  July 2023: Patient told me he was sent to get a CT and there was an eraser sized growth. He was told to come back in August and we'll re-assess. He is very stressed and nervous about this. He hasn't told his partner/family about the mass because he's so worried. He is unsure why he has to wait until August for a f/u. He was wondering if someone could call and explain why he has to wait or if he could be seen/assessed sooner. Will ask PCP to reach out -Smoking 1/2PPD since scan  Gout (Goal: Prevent gout flares) No results found for: "LABURIC" -Controlled -Last Gout Flare: N/A -Current treatment  Allopurnol 366m Appropriate, Effective, Safe, Accessible -Medications previously tried: N/A  -We discussed:  Counseled patient on low purine diet plan. Counseled patient to reduce consumption of high-fructose corn syrup, sweetened soft drinks, fruit juices, meat, and seafood. -Recommended to continue current medication    Patient Goals/Self-Care Activities Patient will:  - take medications as  prescribed  Follow Up Plan: Telephone follow up appointment with care management team member scheduled for: PRN  NArizona Constable Pharm.D. - [phone]       Medication Assistance:  Enbrel obtained through   medication assistance program.  Enrollment ends 10/16/2022. Coordinated through Rheumatology.   Compliance/Adherence/Medication fill history: Care Gaps: Last annual wellness visit?03/15/22   Star Rating Drugs:  Medication:                Last Fill:         Day Supply  Lisinopril                      01/20/22/10/11/21           90ds  Patient's preferred pharmacy is:  [id number] [id number]: [phone]: [phone] Uses pill box? No - takes as prescribed Pt endorses good compliance  We discussed: Benefits of medication synchronization, packaging and delivery as well as enhanced pharmacist oversight with [employer]. Patient decided to: Continue current medication management strategy  Care Plan and Follow Up Patient Decision:  Patient agrees to Care Plan and Follow-up.  Plan: Telephone follow up appointment with care management team member scheduled for:  PRN  NaArizona ConstablePharm.D. - - [phone]

## 2022-05-04 NOTE — Patient Instructions (Signed)
Visit Information   Goals Addressed   None    Patient Care Plan: CCM Pharmacy Care Plan     Problem Identified: Htn, COPD, smoking cessation   Priority: High  Onset Date: 04/30/2021     Long-Range Goal: Disease State Management   Start Date: 04/30/2021  Expected End Date: 04/30/2022  Recent Progress: On track  Priority: High  Note:   Current Barriers:  Unable to achieve control of smoking cessation   Pharmacist Clinical Goal(s):  Patient will achieve control of tobacco use as evidenced by working on smoking cessation through collaboration with PharmD and provider.   Interventions: 1:1 collaboration with [name], MD regarding development and update of comprehensive plan of care as evidenced by provider attestation and co-signature Inter-disciplinary care team collaboration (see longitudinal plan of care) Comprehensive medication review performed; medication list updated in electronic medical record  Hypertension (BP goal <140/90) -Controlled -Current treatment: Lisinopril 5 mg twice daily Appropriate, Effective, Safe, Accessible -Medications previously tried: none reported -Current home readings: not checking regularly -Current dietary habits: doesn't follow specific diet. Doesn't add salt to food.  -Current exercise habits: Stays active working in yard.  -Denies hypotensive/hypertensive symptoms -Educated on BP goals and benefits of medications for prevention of heart attack, stroke and kidney damage; Daily salt intake goal < 2300 mg; Exercise goal of 150 minutes per week; -Counseled to monitor BP at home weekly, document, and provide log at future appointments -Counseled on diet and exercise extensively Recommended to continue current medication  COPD (Goal: control symptoms and prevent exacerbations) -Controlled -Current treatment  Breztri 2 puffs twice daily  -Medications previously tried: none repor -MMRC/CAT score:  July 2022: 4 (low)     No data to  display         Tobacco Use: High Risk (05/04/2022)   Patient History    Smoking Tobacco Use: Every Day    Smokeless Tobacco Use: Never    Passive Exposure: Not on file  -Pulmonary function testing: not available in chart  -Exacerbations requiring treatment in last 6 months: none reported -Patient reports consistent use of maintenance inhaler -Frequency of rescue inhaler use: n/a -Counseled on Benefits of consistent maintenance inhaler use -Counseled on option of patient assistance for Breztri if cost becomes too expensive. Patient reports breathing well controlled.  July 2023: Patient told me he was sent to get a CT and there was an eraser sized growth. He was told to come back in August and we'll re-assess. He is very stressed and nervous about this. He hasn't told his partner/family about the mass because he's so worried. He is unsure why he has to wait until August for a f/u. He was wondering if someone could call and explain why he has to wait or if he could be seen/assessed sooner. Will ask PCP to reach out -Smoking 1/2PPD since scan  Gout (Goal: Prevent gout flares) No results found for: "LABURIC" -Controlled -Last Gout Flare: N/A -Current treatment  Allopurnol '300mg'$  Appropriate, Effective, Safe, Accessible -Medications previously tried: N/A  -We discussed:  Counseled patient on low purine diet plan. Counseled patient to reduce consumption of high-fructose corn syrup, sweetened soft drinks, fruit juices, meat, and seafood. -Recommended to continue current medication    Patient Goals/Self-Care Activities Patient will:  - take medications as prescribed  Follow Up Plan: Telephone follow up appointment with care management team member scheduled for: PRN  Gerald Odom, Pharm.D. - [phone]      Gerald Odom was given information about Chronic Care  Management services today including:  CCM service includes personalized support from designated clinical staff supervised by  his physician, including individualized plan of care and coordination with other care providers 24/7 contact phone numbers for assistance for urgent and routine care needs. Standard insurance, coinsurance, copays and deductibles apply for chronic care management only during months in which we provide at least 20 minutes of these services. Most insurances cover these services at 100%, however patients may be responsible for any copay, coinsurance and/or deductible if applicable. This service may help you avoid the need for more expensive face-to-face services. Only one practitioner may furnish and bill the service in a calendar month. The patient may stop CCM services at any time (effective at the end of the month) by phone call to the office staff.  Patient agreed to services and verbal consent obtained.   The patient verbalized understanding of instructions, educational materials, and care plan provided today and DECLINED offer to receive copy of patient instructions, educational materials, and care plan.  The pharmacy team will reach out to the patient again over the next 60 days.   Gerald Odom, [place]

## 2022-05-16 DIAGNOSIS — I10 Essential (primary) hypertension: Secondary | ICD-10-CM | POA: Diagnosis not present

## 2022-05-18 NOTE — Progress Notes (Signed)
Subjective:  Patient ID: Gerald Odom, male    DOB: 03-09-1951  Age: 71 y.o. MRN: [id number]  Chief Complaint  Patient presents with   Hypertension   Gout    HPI Hyperlipidemia/Aortic atherosclerosis: He is taking Rosuvastatin 10 mg daily. Eating healthy and exercises.  Hypertension: Taking Lisinopril 5 mg daily.  RA:  He takes Methotrexate 2.5 mg 15 mg weekly, Enbrel 50 mg injection weekly, celebrex 200 mg  daily.  Gout: Allopurinol 300 mg daily. No flare ups in a long time.   Tobacco Use: decreased cigarettes.  Working towards quitting.   COPD: on Breztri 2 puffs twice daily. No shortness of breath. Some cough. Started otc azelastine which is helping.   Pulmonary nodule found on lung cancer ct screening. Patient is very anxious about it. 5.6 mm in diameter.   Current Outpatient Medications on File Prior to Visit  Medication Sig Dispense Refill   allopurinol (ZYLOPRIM) 300 MG tablet TAKE 1 TABLET BY MOUTH DAILY BEFORE BREAKFAST. 90 tablet 1   Budeson-Glycopyrrol-Formoterol (BREZTRI AEROSPHERE) 160-9-4.8 MCG/ACT AERO Inhale 2 puffs into the lungs 2 (two) times daily. 10.7 g 5   celecoxib (CELEBREX) 100 MG capsule Take 200 mg by mouth.     etanercept (ENBREL) 50 MG/ML injection Inject 50 mg into the skin once a week.     folic acid (FOLVITE) 1 MG tablet Take by mouth.     lisinopril (ZESTRIL) 5 MG tablet TAKE 1 TABLET TWICE A DAY 180 tablet 1   methotrexate (RHEUMATREX) 2.5 MG tablet Take 15 mg by mouth once a week.     rosuvastatin (CRESTOR) 10 MG tablet Take 1 tablet (10 mg total) by mouth daily. 90 tablet 0   No current facility-administered medications on file prior to visit.   Past Medical History:  Diagnosis Date   Acute cholangitis 05/14/2016   Arthritis    Rheumatoid arthritis   Choledocholithiasis 05/16/2016   DVT (deep venous thrombosis) ([place]) 02/10/2021       Gallstone pancreatitis 05/14/2016   Gout    Hearing loss of left ear due to cerumen impaction 02/09/2020    Hyperlipidemia    Hypertension    Pancreatitis 05/2016   hosp. [facility]   Pneumonia 05/2016   Past Surgical History:  Procedure Laterality Date   CATARACT EXTRACTION Bilateral 2022   CHOLECYSTECTOMY     CHOLECYSTECTOMY N/A 07/04/2016   Procedure: LAPAROSCOPIC CHOLECYSTECTOMY WITH INTRAOPERATIVE CHOLANGIOGRAM;  Surgeon: Jackolyn Confer, MD;  Location: [facility];  Service: General;  Laterality: N/A;   ERCP N/A 05/16/2016   Procedure: ENDOSCOPIC RETROGRADE CHOLANGIOPANCREATOGRAPHY (ERCP);  Surgeon: Doran Stabler, MD;  Location: [facility];  Service: Endoscopy;  Laterality: N/A;   KNEE SURGERY Left 1997    Family History  Problem Relation Age of Onset   Anxiety disorder Father    Social History   Socioeconomic History   Marital status: Married    Spouse name: Gregary Signs   Number of children: Not on file   Years of education: Not on file   Highest education level: Not on file  Occupational History   Not on file  Tobacco Use   Smoking status: Some Days    Packs/day: 0.30    Years: 53.00    Total pack years: 15.90    Types: Cigarettes    Start date: 10/17/1968   Smokeless tobacco: Never   Tobacco comments:    CT Screening ordered 03/15/22  Vaping Use   Vaping Use: Never used  Substance and Sexual  Activity   Alcohol use: Yes    Alcohol/week: 1.0 standard drink of alcohol    Types: 1 Standard drinks or equivalent per week    Comment: ocasionally   Drug use: Never   Sexual activity: Yes    Partners: Female  Other Topics Concern   Not on file  Social History Narrative   Not on file   Social Determinants of Health   Financial Resource Strain: Low Risk  (05/04/2022)   Overall Financial Resource Strain (CARDIA)    Difficulty of Paying Living Expenses: Not hard at all  Food Insecurity: No Food Insecurity (03/15/2022)   Hunger Vital Sign    Worried About Running Out of Food in the Last Year: Never true    Ran Out of Food in the Last Year: Never true  Transportation Needs: No  Transportation Needs (03/15/2022)   PRAPARE - Hydrologist (Medical): No    Lack of Transportation (Non-Medical): No  Physical Activity: Not on file  Stress: Not on file  Social Connections: Not on file    Review of Systems  Constitutional:  Negative for chills, fatigue, fever and unexpected weight change.  HENT:  Negative for congestion, ear pain, sinus pain and sore throat.   Respiratory:  Positive for cough. Negative for shortness of breath.   Cardiovascular:  Negative for chest pain and palpitations.  Gastrointestinal:  Negative for abdominal pain, blood in stool, constipation, diarrhea, nausea and vomiting.  Endocrine: Negative for polydipsia.  Genitourinary:  Negative for dysuria.  Musculoskeletal:  Negative for back pain.  Skin:  Negative for rash.  Neurological:  Negative for headaches.     Objective:  BP 110/72   Pulse 66   Temp 98 F (36.7 C)   Resp 15   Ht '5\' 7"'$  (1.702 m)   Wt 161 lb (73 kg)   SpO2 92%   BMI 25.22 kg/m      05/19/2022    8:01 AM 03/15/2022    8:49 AM 11/18/2021    7:58 AM  BP/Weight  Systolic BP 892 119 417  Diastolic BP 72 78 70  Wt. (Lbs) 161 163.8 164  BMI 25.22 kg/m2 25.65 kg/m2 24.94 kg/m2    Physical Exam Vitals reviewed.  Constitutional:      Appearance: Normal appearance.  Neck:     Vascular: No carotid bruit.  Cardiovascular:     Rate and Rhythm: Normal rate and regular rhythm.     Pulses: Normal pulses.     Heart sounds: Normal heart sounds.  Pulmonary:     Effort: Pulmonary effort is normal.     Breath sounds: Normal breath sounds. No wheezing, rhonchi or rales.  Abdominal:     General: Bowel sounds are normal.     Palpations: Abdomen is soft.     Tenderness: There is no abdominal tenderness.  Neurological:     Mental Status: He is alert and oriented to person, place, and time.  Psychiatric:        Mood and Affect: Mood normal.        Behavior: Behavior normal.     Diabetic Foot Exam -  Simple   No data filed      Lab Results  Component Value Date   WBC 9.1 05/19/2022   HGB 16.0 05/19/2022   HCT 46.4 05/19/2022   PLT 203 05/19/2022   GLUCOSE 101 (H) 05/19/2022   CHOL 104 05/19/2022   TRIG 94 05/19/2022   HDL 48 05/19/2022  LDLCALC 38 05/19/2022   ALT 17 05/19/2022   AST 20 05/19/2022   NA 141 05/19/2022   K 4.8 05/19/2022   CL 105 05/19/2022   CREATININE 1.03 05/19/2022   BUN 11 05/19/2022   CO2 22 05/19/2022   TSH 1.880 01/30/2020   INR 0.97 05/14/2016      Assessment & Plan:   Problem List Items Addressed This Visit       Cardiovascular and Mediastinum   Essential hypertension, benign - Primary    Well controlled.  No changes to medicines. Continue lisinopril 5 mg daily.  Continue to work on eating a healthy diet and exercise.  Labs drawn today.        Relevant Orders   Comprehensive metabolic panel (Completed)   Lipid panel (Completed)   CBC with Differential/Platelet (Completed)   Aortic atherosclerosis (HCC)    Continue rosuvastatin 10 mg before bed.  Recommend continue to work on eating healthy diet and exercise. Check labs.         Respiratory   Lung nodule    Recheck ct of chest 3 months.      Relevant Orders   CT Chest Wo Contrast   Simple chronic bronchitis ([place])    Continue breztri.  Check ct scan of chest.      Relevant Orders   CT Chest Wo Contrast     Nervous and Auditory   Cigarette nicotine dependence with nicotine-induced disorder    Encouraged cessation. Patient is working on it.         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 Continue allopurinol 300 mg daily.       Relevant Orders   Uric acid (Completed)   Rheumatoid arthritis involving multiple sites with positive rheumatoid factor (HCC)    The current medical regimen is effective;  continue present plan and medications.  Continue Methotrexate 2.5 mg 15 mg weekly, Enbrel 50 mg injection weekly, celebrex 200 mg  Daily. Management per specialist.          Other   Chronic cough    Check ct of chest.      .  No orders of the defined types were placed in this encounter.   Orders Placed This Encounter  Procedures   CT Chest Wo Contrast   Comprehensive metabolic panel   Lipid panel   CBC with Differential/Platelet   Uric acid     Follow-up: Return in about 3 months (around 08/19/2022) for chronic fasting.  An After Visit Summary was printed and given to the patient.  [name], MD [facility] [phone]

## 2022-05-19 ENCOUNTER — Ambulatory Visit (INDEPENDENT_AMBULATORY_CARE_PROVIDER_SITE_OTHER): Payer: PPO | Admitting: Family Medicine

## 2022-05-19 ENCOUNTER — Encounter: Payer: Self-pay | Admitting: Family Medicine

## 2022-05-19 VITALS — BP 110/72 | HR 66 | Temp 98.0°F | Resp 15 | Ht 67.0 in | Wt 161.0 lb

## 2022-05-19 DIAGNOSIS — I10 Essential (primary) hypertension: Secondary | ICD-10-CM

## 2022-05-19 DIAGNOSIS — I700 Atherosclerosis of aorta: Secondary | ICD-10-CM | POA: Diagnosis not present

## 2022-05-19 DIAGNOSIS — M1A0490 Idiopathic chronic gout, unspecified hand, without tophus (tophi): Secondary | ICD-10-CM | POA: Diagnosis not present

## 2022-05-19 DIAGNOSIS — R911 Solitary pulmonary nodule: Secondary | ICD-10-CM | POA: Diagnosis not present

## 2022-05-19 DIAGNOSIS — J410 Simple chronic bronchitis: Secondary | ICD-10-CM | POA: Diagnosis not present

## 2022-05-19 DIAGNOSIS — R053 Chronic cough: Secondary | ICD-10-CM

## 2022-05-19 DIAGNOSIS — F17219 Nicotine dependence, cigarettes, with unspecified nicotine-induced disorders: Secondary | ICD-10-CM | POA: Diagnosis not present

## 2022-05-19 DIAGNOSIS — M0579 Rheumatoid arthritis with rheumatoid factor of multiple sites without organ or systems involvement: Secondary | ICD-10-CM | POA: Diagnosis not present

## 2022-05-20 LAB — CBC WITH DIFFERENTIAL/PLATELET
Basophils Absolute: 0.1 10*3/uL (ref 0.0–0.2)
Basos: 1 %
EOS (ABSOLUTE): 0.2 10*3/uL (ref 0.0–0.4)
Eos: 3 %
Hematocrit: 46.4 % (ref 37.5–51.0)
Hemoglobin: 16 g/dL (ref 13.0–17.7)
Immature Grans (Abs): 0 10*3/uL (ref 0.0–0.1)
Immature Granulocytes: 0 %
Lymphocytes Absolute: 3.6 10*3/uL — ABNORMAL HIGH (ref 0.7–3.1)
Lymphs: 40 %
MCH: 33.7 pg — ABNORMAL HIGH (ref 26.6–33.0)
MCHC: 34.5 g/dL (ref 31.5–35.7)
MCV: 98 fL — ABNORMAL HIGH (ref 79–97)
Monocytes Absolute: 0.9 10*3/uL (ref 0.1–0.9)
Monocytes: 10 %
Neutrophils Absolute: 4.3 10*3/uL (ref 1.4–7.0)
Neutrophils: 46 %
Platelets: 203 10*3/uL (ref 150–450)
RBC: 4.75 x10E6/uL (ref 4.14–5.80)
RDW: 13.7 % (ref 11.6–15.4)
WBC: 9.1 10*3/uL (ref 3.4–10.8)

## 2022-05-20 LAB — LIPID PANEL
Chol/HDL Ratio: 2.2 ratio (ref 0.0–5.0)
Cholesterol, Total: 104 mg/dL (ref 100–199)
HDL: 48 mg/dL (ref >39)
LDL Chol Calc (NIH): 38 mg/dL (ref 0–99)
Triglycerides: 94 mg/dL (ref 0–149)
VLDL Cholesterol Cal: 18 mg/dL (ref 5–40)

## 2022-05-20 LAB — COMPREHENSIVE METABOLIC PANEL
ALT: 17 IU/L (ref 0–44)
AST: 20 IU/L (ref 0–40)
Albumin/Globulin Ratio: 1.6 (ref 1.2–2.2)
Albumin: 4.2 g/dL (ref 3.8–4.8)
Alkaline Phosphatase: 92 IU/L (ref 44–121)
BUN/Creatinine Ratio: 11 (ref 10–24)
BUN: 11 mg/dL (ref 8–27)
Bilirubin Total: 0.7 mg/dL (ref 0.0–1.2)
CO2: 22 mmol/L (ref 20–29)
Calcium: 9.2 mg/dL (ref 8.6–10.2)
Chloride: 105 mmol/L (ref 96–106)
Creatinine, Ser: 1.03 mg/dL (ref 0.76–1.27)
Globulin, Total: 2.7 g/dL (ref 1.5–4.5)
Glucose: 101 mg/dL — ABNORMAL HIGH (ref 70–99)
Potassium: 4.8 mmol/L (ref 3.5–5.2)
Sodium: 141 mmol/L (ref 134–144)
Total Protein: 6.9 g/dL (ref 6.0–8.5)
eGFR: 77 mL/min/{1.73_m2} (ref >59)

## 2022-05-20 LAB — URIC ACID: Uric Acid: 3.5 mg/dL — ABNORMAL LOW (ref 3.8–8.4)

## 2022-05-22 NOTE — Assessment & Plan Note (Signed)
Well controlled.  No changes to medicines. Continue lisinopril 5 mg daily.  Continue to work on eating a healthy diet and exercise.  Labs drawn today.   

## 2022-05-22 NOTE — Assessment & Plan Note (Signed)
Continue rosuvastatin 10 mg before bed.  Recommend continue to work on eating healthy diet and exercise. Check labs.

## 2022-05-22 NOTE — Assessment & Plan Note (Signed)
The current medical regimen is effective;  continue present plan and medications. Continue Methotrexate 2.5 mg 15 mg weekly, Enbrel 50 mg injection weekly, celebrex 200 mg  Daily. Management per specialist.

## 2022-05-22 NOTE — Assessment & Plan Note (Signed)
Check ct of chest.

## 2022-05-22 NOTE — Assessment & Plan Note (Signed)
Continue breztri.  Check ct scan of chest.

## 2022-05-22 NOTE — Assessment & Plan Note (Signed)
Encouraged cessation. Patient is working on it.

## 2022-05-22 NOTE — Assessment & Plan Note (Signed)
The current medical regimen is effective;  continue present plan and medications. Continue allopurinol 300 mg daily.

## 2022-05-22 NOTE — Assessment & Plan Note (Signed)
Recheck ct of chest 3 months.

## 2022-05-23 ENCOUNTER — Telehealth: Payer: Self-pay | Admitting: Family Medicine

## 2022-05-23 NOTE — Telephone Encounter (Signed)
   Gerald Odom has been scheduled for the following appointment:  WHAT: CHEST CT WHERE: [facility] DATE: 06/24/22 TIME: 8:00 AM CHECK-IN  Patient has been made aware.

## 2022-05-30 DIAGNOSIS — M0579 Rheumatoid arthritis with rheumatoid factor of multiple sites without organ or systems involvement: Secondary | ICD-10-CM | POA: Diagnosis not present

## 2022-05-30 DIAGNOSIS — M1A09X0 Idiopathic chronic gout, multiple sites, without tophus (tophi): Secondary | ICD-10-CM | POA: Diagnosis not present

## 2022-05-30 DIAGNOSIS — Z79899 Other long term (current) drug therapy: Secondary | ICD-10-CM | POA: Diagnosis not present

## 2022-06-24 DIAGNOSIS — J410 Simple chronic bronchitis: Secondary | ICD-10-CM | POA: Diagnosis not present

## 2022-06-24 DIAGNOSIS — R911 Solitary pulmonary nodule: Secondary | ICD-10-CM | POA: Diagnosis not present

## 2022-06-24 DIAGNOSIS — J479 Bronchiectasis, uncomplicated: Secondary | ICD-10-CM | POA: Diagnosis not present

## 2022-06-24 DIAGNOSIS — C3490 Malignant neoplasm of unspecified part of unspecified bronchus or lung: Secondary | ICD-10-CM | POA: Diagnosis not present

## 2022-06-24 DIAGNOSIS — J439 Emphysema, unspecified: Secondary | ICD-10-CM | POA: Diagnosis not present

## 2022-06-29 ENCOUNTER — Other Ambulatory Visit: Payer: Self-pay

## 2022-06-29 DIAGNOSIS — R911 Solitary pulmonary nodule: Secondary | ICD-10-CM

## 2022-06-29 DIAGNOSIS — J410 Simple chronic bronchitis: Secondary | ICD-10-CM

## 2022-07-03 ENCOUNTER — Other Ambulatory Visit: Payer: Self-pay | Admitting: Family Medicine

## 2022-07-22 ENCOUNTER — Other Ambulatory Visit: Payer: Self-pay | Admitting: Physician Assistant

## 2022-08-22 ENCOUNTER — Ambulatory Visit: Payer: PPO | Admitting: Family Medicine

## 2022-09-01 NOTE — Progress Notes (Unsigned)
Subjective:  Patient ID: Gerald Odom, male    DOB: [birth year]  Age: 71 y.o. MRN: [id number]  Chief Complaint  Patient presents with   Hypertension    HPI  Hyperlipidemia/Aortic atherosclerosis: He is taking Rosuvastatin 10 mg daily. Eating healthy and exercises.  Hypertension: Taking Lisinopril 5 mg daily.  RA:  He takes Methotrexate 2.5 mg 15 mg weekly, Enbrel 50 mg injection weekly, celebrex 200 mg daily as needed.  Gout: Allopurinol 300 mg daily. No flare ups in a long time.   Tobacco Use: quit in September.   COPD: on Breztri 2 puffs twice daily. No shortness of breath. Some cough. Started otc azelastine which is helping.  Failed on zyrtec, claritin, flonase.   Current Outpatient Medications on File Prior to Visit  Medication Sig Dispense Refill   allopurinol (ZYLOPRIM) 300 MG tablet TAKE 1 TABLET BY MOUTH DAILY BEFORE BREAKFAST. 90 tablet 1   Budeson-Glycopyrrol-Formoterol (BREZTRI AEROSPHERE) 160-9-4.8 MCG/ACT AERO Inhale 2 puffs into the lungs 2 (two) times daily. 10.7 g 5   celecoxib (CELEBREX) 100 MG capsule Take 200 mg by mouth.     etanercept (ENBREL) 50 MG/ML injection Inject 50 mg into the skin once a week.     folic acid (FOLVITE) 1 MG tablet Take by mouth.     lisinopril (ZESTRIL) 5 MG tablet TAKE 1 TABLET BY MOUTH TWICE A DAY 180 tablet 0   methotrexate (RHEUMATREX) 2.5 MG tablet Take 15 mg by mouth once a week.     rosuvastatin (CRESTOR) 10 MG tablet TAKE 1 TABLET BY MOUTH EVERY DAY 90 tablet 0   No current facility-administered medications on file prior to visit.   Past Medical History:  Diagnosis Date   Acute cholangitis 05/14/2016   Arthritis    Rheumatoid arthritis   Choledocholithiasis 05/16/2016   DVT (deep venous thrombosis) ([place]) 02/10/2021       Gallstone pancreatitis 05/14/2016   Gout    Hearing loss of left ear due to cerumen impaction 02/09/2020   Hyperlipidemia    Hypertension    Pancreatitis 05/2016   hosp. [facility]   Pneumonia 05/2016    Past Surgical History:  Procedure Laterality Date   CATARACT EXTRACTION Bilateral 2022   CHOLECYSTECTOMY     CHOLECYSTECTOMY N/A 07/04/2016   Procedure: LAPAROSCOPIC CHOLECYSTECTOMY WITH INTRAOPERATIVE CHOLANGIOGRAM;  Surgeon: Jackolyn Confer, MD;  Location: [facility];  Service: General;  Laterality: N/A;   ERCP N/A 05/16/2016   Procedure: ENDOSCOPIC RETROGRADE CHOLANGIOPANCREATOGRAPHY (ERCP);  Surgeon: Doran Stabler, MD;  Location: [facility];  Service: Endoscopy;  Laterality: N/A;   KNEE SURGERY Left 1997    Family History  Problem Relation Age of Onset   Anxiety disorder Father    Social History   Socioeconomic History   Marital status: Married    Spouse name: Gregary Signs   Number of children: Not on file   Years of education: Not on file   Highest education level: Not on file  Occupational History   Not on file  Tobacco Use   Smoking status: Some Days    Packs/day: 0.30    Years: 53.00    Total pack years: 15.90    Types: Cigarettes    Start date: 10/17/1968   Smokeless tobacco: Never   Tobacco comments:    CT Screening ordered 03/15/22  Vaping Use   Vaping Use: Never used  Substance and Sexual Activity   Alcohol use: Yes    Alcohol/week: 1.0 standard drink of alcohol  Types: 1 Standard drinks or equivalent per week    Comment: ocasionally   Drug use: Never   Sexual activity: Yes    Partners: Female  Other Topics Concern   Not on file  Social History Narrative   Not on file   Social Determinants of Health   Financial Resource Strain: Low Risk  (05/04/2022)   Overall Financial Resource Strain (CARDIA)    Difficulty of Paying Living Expenses: Not hard at all  Food Insecurity: No Food Insecurity (03/15/2022)   Hunger Vital Sign    Worried About Running Out of Food in the Last Year: Never true    Ran Out of Food in the Last Year: Never true  Transportation Needs: No Transportation Needs (03/15/2022)   PRAPARE - Hydrologist  (Medical): No    Lack of Transportation (Non-Medical): No  Physical Activity: Not on file  Stress: Not on file  Social Connections: Not on file    Review of Systems  Constitutional:  Negative for chills, fatigue, fever and unexpected weight change.  HENT:  Negative for congestion, ear pain, sinus pain and sore throat.   Respiratory:  Negative for cough and shortness of breath.   Cardiovascular:  Negative for chest pain and palpitations.  Gastrointestinal:  Negative for abdominal pain, blood in stool, constipation, diarrhea, nausea and vomiting.  Endocrine: Negative for polydipsia.  Genitourinary:  Negative for dysuria.       Erectile dysfunction x 3 months.   Musculoskeletal:  Negative for back pain.  Skin:  Negative for rash.  Neurological:  Negative for headaches.  Psychiatric/Behavioral:  Negative for dysphoric mood. The patient is not nervous/anxious.      Objective:  BP 120/70 (BP Location: Right Arm, Patient Position: Sitting, Cuff Size: Large)   Pulse 79   Temp (!) 97.3 F (36.3 C) (Temporal)   Ht '5\' 7"'$  (1.702 m)   Wt 168 lb (76.2 kg)   SpO2 92%   BMI 26.31 kg/m      09/02/2022    7:47 AM 05/19/2022    8:01 AM 03/15/2022    8:49 AM  BP/Weight  Systolic BP 767 341 937  Diastolic BP 70 72 78  Wt. (Lbs) 168 161 163.8  BMI 26.31 kg/m2 25.22 kg/m2 25.65 kg/m2    Physical Exam Vitals reviewed.  Constitutional:      Appearance: Normal appearance.  Neck:     Vascular: No carotid bruit.  Cardiovascular:     Rate and Rhythm: Normal rate and regular rhythm.     Heart sounds: Normal heart sounds.  Pulmonary:     Effort: Pulmonary effort is normal.     Breath sounds: Normal breath sounds. No wheezing, rhonchi or rales.  Abdominal:     General: Bowel sounds are normal.     Palpations: Abdomen is soft.     Tenderness: There is no abdominal tenderness.  Neurological:     Mental Status: He is alert.  Psychiatric:        Mood and Affect: Mood normal.         Behavior: Behavior normal.     Diabetic Foot Exam - Simple   No data filed      Lab Results  Component Value Date   WBC 9.1 05/19/2022   HGB 16.0 05/19/2022   HCT 46.4 05/19/2022   PLT 203 05/19/2022   GLUCOSE 101 (H) 05/19/2022   CHOL 104 05/19/2022   TRIG 94 05/19/2022   HDL 48 05/19/2022  LDLCALC 38 05/19/2022   ALT 17 05/19/2022   AST 20 05/19/2022   NA 141 05/19/2022   K 4.8 05/19/2022   CL 105 05/19/2022   CREATININE 1.03 05/19/2022   BUN 11 05/19/2022   CO2 22 05/19/2022   TSH 1.880 01/30/2020   INR 0.97 05/14/2016      Assessment & Plan:   Problem List Items Addressed This Visit       Cardiovascular and Mediastinum   Essential hypertension, benign - Primary    Well controlled.  No changes to medicines.  Continue to work on eating a healthy diet and exercise.  Labs drawn today.        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       Relevant Orders   Uric acid   Rheumatoid arthritis involving multiple sites with positive rheumatoid factor ([place])    Management per specialist.        Other   Erectile dysfunction   Relevant Orders   Testosterone,Free and Total   TSH  .  No orders of the defined types were placed in this encounter.   Orders Placed This Encounter  Procedures   Comprehensive metabolic panel   Lipid panel   CBC with Differential/Platelet   Uric acid   Testosterone,Free and Total   TSH     Follow-up: Return in about 3 months (around 12/03/2022) for chronic fasting.  An After Visit Summary was printed and given to the patient.  [name], MD [facility] [phone]

## 2022-09-01 NOTE — Assessment & Plan Note (Signed)
The current medical regimen is effective;  continue present plan and medications.  

## 2022-09-01 NOTE — Assessment & Plan Note (Signed)
Management per specialist. 

## 2022-09-01 NOTE — Assessment & Plan Note (Signed)
Well controlled.  ?No changes to medicines.  ?Continue to work on eating a healthy diet and exercise.  ?Labs drawn today.  ?

## 2022-09-02 ENCOUNTER — Ambulatory Visit (INDEPENDENT_AMBULATORY_CARE_PROVIDER_SITE_OTHER): Payer: PPO | Admitting: Family Medicine

## 2022-09-02 ENCOUNTER — Encounter: Payer: Self-pay | Admitting: Family Medicine

## 2022-09-02 VITALS — BP 120/70 | HR 79 | Temp 97.3°F | Ht 67.0 in | Wt 168.0 lb

## 2022-09-02 DIAGNOSIS — M0579 Rheumatoid arthritis with rheumatoid factor of multiple sites without organ or systems involvement: Secondary | ICD-10-CM | POA: Diagnosis not present

## 2022-09-02 DIAGNOSIS — M1A0490 Idiopathic chronic gout, unspecified hand, without tophus (tophi): Secondary | ICD-10-CM

## 2022-09-02 DIAGNOSIS — I10 Essential (primary) hypertension: Secondary | ICD-10-CM

## 2022-09-02 DIAGNOSIS — Z23 Encounter for immunization: Secondary | ICD-10-CM | POA: Diagnosis not present

## 2022-09-02 DIAGNOSIS — N529 Male erectile dysfunction, unspecified: Secondary | ICD-10-CM

## 2022-09-03 LAB — CBC WITH DIFFERENTIAL/PLATELET
Basophils Absolute: 0.1 10*3/uL (ref 0.0–0.2)
Basos: 1 %
EOS (ABSOLUTE): 0.2 10*3/uL (ref 0.0–0.4)
Eos: 2 %
Hematocrit: 49.3 % (ref 37.5–51.0)
Hemoglobin: 16.7 g/dL (ref 13.0–17.7)
Immature Grans (Abs): 0 10*3/uL (ref 0.0–0.1)
Immature Granulocytes: 0 %
Lymphocytes Absolute: 3.2 10*3/uL — ABNORMAL HIGH (ref 0.7–3.1)
Lymphs: 38 %
MCH: 33.7 pg — ABNORMAL HIGH (ref 26.6–33.0)
MCHC: 33.9 g/dL (ref 31.5–35.7)
MCV: 100 fL — ABNORMAL HIGH (ref 79–97)
Monocytes Absolute: 0.7 10*3/uL (ref 0.1–0.9)
Monocytes: 9 %
Neutrophils Absolute: 4.1 10*3/uL (ref 1.4–7.0)
Neutrophils: 50 %
Platelets: 217 10*3/uL (ref 150–450)
RBC: 4.95 x10E6/uL (ref 4.14–5.80)
RDW: 13.5 % (ref 11.6–15.4)
WBC: 8.2 10*3/uL (ref 3.4–10.8)

## 2022-09-03 LAB — COMPREHENSIVE METABOLIC PANEL
ALT: 16 IU/L (ref 0–44)
AST: 19 IU/L (ref 0–40)
Albumin/Globulin Ratio: 1.6 (ref 1.2–2.2)
Albumin: 4.4 g/dL (ref 3.8–4.8)
Alkaline Phosphatase: 94 IU/L (ref 44–121)
BUN/Creatinine Ratio: 11 (ref 10–24)
BUN: 10 mg/dL (ref 8–27)
Bilirubin Total: 0.7 mg/dL (ref 0.0–1.2)
CO2: 21 mmol/L (ref 20–29)
Calcium: 9.2 mg/dL (ref 8.6–10.2)
Chloride: 103 mmol/L (ref 96–106)
Creatinine, Ser: 0.93 mg/dL (ref 0.76–1.27)
Globulin, Total: 2.8 g/dL (ref 1.5–4.5)
Glucose: 104 mg/dL — ABNORMAL HIGH (ref 70–99)
Potassium: 4.7 mmol/L (ref 3.5–5.2)
Sodium: 141 mmol/L (ref 134–144)
Total Protein: 7.2 g/dL (ref 6.0–8.5)
eGFR: 87 mL/min/{1.73_m2} (ref >59)

## 2022-09-03 LAB — LIPID PANEL
Chol/HDL Ratio: 2.4 ratio (ref 0.0–5.0)
Cholesterol, Total: 108 mg/dL (ref 100–199)
HDL: 45 mg/dL (ref >39)
LDL Chol Calc (NIH): 45 mg/dL (ref 0–99)
Triglycerides: 95 mg/dL (ref 0–149)
VLDL Cholesterol Cal: 18 mg/dL (ref 5–40)

## 2022-09-03 LAB — CARDIOVASCULAR RISK ASSESSMENT

## 2022-09-03 LAB — URIC ACID: Uric Acid: 3.6 mg/dL — ABNORMAL LOW (ref 3.8–8.4)

## 2022-09-04 NOTE — Progress Notes (Signed)
Tsh and Testosterone levels pending.  Blood count normal.  Liver function normal.  Kidney function normal.  Cholesterol: good Uric acid low.

## 2022-09-06 LAB — TSH: TSH: 1.86 u[IU]/mL (ref 0.450–4.500)

## 2022-09-06 LAB — TESTOSTERONE,FREE AND TOTAL
Testosterone, Free: 6.7 pg/mL (ref 6.6–18.1)
Testosterone: 640 ng/dL (ref 264–916)

## 2022-09-06 NOTE — Progress Notes (Signed)
Thyroid function normal.  Testosterone levels good.  If patient would like to try viagra, I will send to pharmacy.

## 2022-09-19 ENCOUNTER — Other Ambulatory Visit: Payer: Self-pay | Admitting: Nurse Practitioner

## 2022-09-19 DIAGNOSIS — M1A0490 Idiopathic chronic gout, unspecified hand, without tophus (tophi): Secondary | ICD-10-CM

## 2022-10-02 ENCOUNTER — Other Ambulatory Visit: Payer: Self-pay | Admitting: Family Medicine

## 2022-10-03 DIAGNOSIS — M1A09X0 Idiopathic chronic gout, multiple sites, without tophus (tophi): Secondary | ICD-10-CM | POA: Diagnosis not present

## 2022-10-03 DIAGNOSIS — Z79899 Other long term (current) drug therapy: Secondary | ICD-10-CM | POA: Diagnosis not present

## 2022-10-03 DIAGNOSIS — M0579 Rheumatoid arthritis with rheumatoid factor of multiple sites without organ or systems involvement: Secondary | ICD-10-CM | POA: Diagnosis not present

## 2022-10-11 DIAGNOSIS — J441 Chronic obstructive pulmonary disease with (acute) exacerbation: Secondary | ICD-10-CM | POA: Diagnosis not present

## 2022-10-11 DIAGNOSIS — Z20822 Contact with and (suspected) exposure to covid-19: Secondary | ICD-10-CM | POA: Diagnosis not present

## 2022-10-11 DIAGNOSIS — U071 COVID-19: Secondary | ICD-10-CM | POA: Diagnosis not present

## 2022-10-19 ENCOUNTER — Other Ambulatory Visit: Payer: Self-pay | Admitting: Physician Assistant

## 2022-12-03 ENCOUNTER — Other Ambulatory Visit: Payer: Self-pay

## 2022-12-03 DIAGNOSIS — I10 Essential (primary) hypertension: Secondary | ICD-10-CM

## 2022-12-03 DIAGNOSIS — I700 Atherosclerosis of aorta: Secondary | ICD-10-CM

## 2022-12-05 ENCOUNTER — Other Ambulatory Visit: Payer: PPO

## 2022-12-05 DIAGNOSIS — I700 Atherosclerosis of aorta: Secondary | ICD-10-CM

## 2022-12-05 DIAGNOSIS — I10 Essential (primary) hypertension: Secondary | ICD-10-CM | POA: Diagnosis not present

## 2022-12-06 LAB — CBC WITH DIFFERENTIAL/PLATELET
Basophils Absolute: 0.1 10*3/uL (ref 0.0–0.2)
Basos: 0 %
EOS (ABSOLUTE): 0.3 10*3/uL (ref 0.0–0.4)
Eos: 2 %
Hematocrit: 48.9 % (ref 37.5–51.0)
Hemoglobin: 16.9 g/dL (ref 13.0–17.7)
Immature Grans (Abs): 0 10*3/uL (ref 0.0–0.1)
Immature Granulocytes: 0 %
Lymphocytes Absolute: 4.9 10*3/uL — ABNORMAL HIGH (ref 0.7–3.1)
Lymphs: 39 %
MCH: 33.2 pg — ABNORMAL HIGH (ref 26.6–33.0)
MCHC: 34.6 g/dL (ref 31.5–35.7)
MCV: 96 fL (ref 79–97)
Monocytes Absolute: 1.2 10*3/uL — ABNORMAL HIGH (ref 0.1–0.9)
Monocytes: 10 %
Neutrophils Absolute: 5.9 10*3/uL (ref 1.4–7.0)
Neutrophils: 49 %
Platelets: 223 10*3/uL (ref 150–450)
RBC: 5.09 x10E6/uL (ref 4.14–5.80)
RDW: 13.1 % (ref 11.6–15.4)
WBC: 12.3 10*3/uL — ABNORMAL HIGH (ref 3.4–10.8)

## 2022-12-06 LAB — LIPID PANEL
Chol/HDL Ratio: 2.4 ratio (ref 0.0–5.0)
Cholesterol, Total: 124 mg/dL (ref 100–199)
HDL: 51 mg/dL (ref >39)
LDL Chol Calc (NIH): 54 mg/dL (ref 0–99)
Triglycerides: 101 mg/dL (ref 0–149)
VLDL Cholesterol Cal: 19 mg/dL (ref 5–40)

## 2022-12-06 LAB — COMPREHENSIVE METABOLIC PANEL
ALT: 17 IU/L (ref 0–44)
AST: 19 IU/L (ref 0–40)
Albumin/Globulin Ratio: 1.5 (ref 1.2–2.2)
Albumin: 4.1 g/dL (ref 3.8–4.8)
Alkaline Phosphatase: 87 IU/L (ref 44–121)
BUN/Creatinine Ratio: 14 (ref 10–24)
BUN: 15 mg/dL (ref 8–27)
Bilirubin Total: 0.7 mg/dL (ref 0.0–1.2)
CO2: 21 mmol/L (ref 20–29)
Calcium: 9.1 mg/dL (ref 8.6–10.2)
Chloride: 104 mmol/L (ref 96–106)
Creatinine, Ser: 1.04 mg/dL (ref 0.76–1.27)
Globulin, Total: 2.7 g/dL (ref 1.5–4.5)
Glucose: 99 mg/dL (ref 70–99)
Potassium: 4.7 mmol/L (ref 3.5–5.2)
Sodium: 141 mmol/L (ref 134–144)
Total Protein: 6.8 g/dL (ref 6.0–8.5)
eGFR: 76 mL/min/{1.73_m2} (ref >59)

## 2022-12-06 LAB — CARDIOVASCULAR RISK ASSESSMENT

## 2022-12-06 NOTE — Progress Notes (Signed)
Blood count abnormal. Wbc elevated to 12.3. Liver function normal.  Kidney function normal.  Cholesterol: good. Keep appt this week

## 2022-12-08 ENCOUNTER — Ambulatory Visit (INDEPENDENT_AMBULATORY_CARE_PROVIDER_SITE_OTHER): Payer: PPO | Admitting: Family Medicine

## 2022-12-08 ENCOUNTER — Telehealth: Payer: Self-pay

## 2022-12-08 ENCOUNTER — Encounter: Payer: Self-pay | Admitting: Family Medicine

## 2022-12-08 VITALS — BP 132/68 | HR 76 | Temp 97.7°F | Ht 67.0 in | Wt 176.0 lb

## 2022-12-08 DIAGNOSIS — M1A0490 Idiopathic chronic gout, unspecified hand, without tophus (tophi): Secondary | ICD-10-CM

## 2022-12-08 DIAGNOSIS — K219 Gastro-esophageal reflux disease without esophagitis: Secondary | ICD-10-CM

## 2022-12-08 DIAGNOSIS — F17219 Nicotine dependence, cigarettes, with unspecified nicotine-induced disorders: Secondary | ICD-10-CM

## 2022-12-08 DIAGNOSIS — I700 Atherosclerosis of aorta: Secondary | ICD-10-CM | POA: Diagnosis not present

## 2022-12-08 DIAGNOSIS — I10 Essential (primary) hypertension: Secondary | ICD-10-CM | POA: Diagnosis not present

## 2022-12-08 DIAGNOSIS — M0579 Rheumatoid arthritis with rheumatoid factor of multiple sites without organ or systems involvement: Secondary | ICD-10-CM

## 2022-12-08 DIAGNOSIS — N529 Male erectile dysfunction, unspecified: Secondary | ICD-10-CM | POA: Diagnosis not present

## 2022-12-08 MED ORDER — SILDENAFIL CITRATE 20 MG PO TABS: ORAL_TABLET | ORAL | 0 refills | Status: DC

## 2022-12-08 MED ORDER — PREDNISONE 5 MG PO TABS: 5.0000 mg | ORAL_TABLET | Freq: Every day | ORAL | 0 refills | Status: AC | PRN

## 2022-12-08 NOTE — Telephone Encounter (Signed)
Patient high risk per [employer] notification. Will have team call and ask about Gerald Odom cost and schedule f/u for April

## 2022-12-08 NOTE — Progress Notes (Unsigned)
Subjective:  Patient ID: Gerald Odom, male    DOB: [birth year]  Age: 72 y.o. MRN: [id number]  Chief Complaint  Patient presents with   Hyperlipidemia   Hypertension   COPD    HPI Hyperlipidemia/Aortic atherosclerosis: He is taking Rosuvastatin 10 mg daily. Eating healthy and exercises.   Hypertension: Taking Lisinopril 5 mg daily.   RA:  He takes Methotrexate 2.5 mg 15 mg weekly, Enbrel 50 mg injection weekly, celebrex 200 mg daily as needed. Well controlled.  Had a little flare up on Saturday. Took prednisone 5 mg daily x 2 day.    Gout: Allopurinol 300 mg daily. No flare ups in a long time.    Tobacco Use: Currently smoking. Restarted 1/2 - 1 ppd.    COPD: on Breztri 2 puffs twice daily. No shortness of breath. Some cough. Started otc azelastine which is helping. Failed on zyrtec, claritin, flonase.      12/08/2022   10:42 AM 03/15/2022    9:12 AM 11/18/2021    8:04 AM 03/10/2021    8:12 AM 02/10/2021    1:56 PM  Depression screen PHQ 2/9  Decreased Interest 0 0 0 0 0  Down, Depressed, Hopeless 0 0 0 0 0  PHQ - 2 Score 0 0 0 0 0         09/01/2021    8:35 AM 11/04/2021   11:13 AM 11/18/2021    8:03 AM 03/15/2022    9:14 AM 12/08/2022   10:42 AM  Fall Risk  Falls in the past year? 0 0 0 0 0  Was there an injury with Fall? 0 0 0 0 0  Fall Risk Category Calculator 0 0 0 0 0  Fall Risk Category (Retired) Low Low Low Low   (RETIRED) Patient Fall Risk Level Low fall risk Low fall risk  Low fall risk   Patient at Risk for Falls Due to No Fall Risks No Fall Risks  No Fall Risks No Fall Risks  Fall risk Follow up Falls evaluation completed Falls evaluation completed Falls evaluation completed Falls evaluation completed;Education provided Falls evaluation completed      Review of Systems  Constitutional:  Negative for chills, diaphoresis, fatigue and fever.  HENT:  Negative for congestion, ear pain and sore throat.   Respiratory:  Negative for cough and shortness of breath.    Cardiovascular:  Negative for chest pain and leg swelling.  Gastrointestinal:  Negative for abdominal pain, constipation, diarrhea, nausea and vomiting.  Genitourinary:  Negative for dysuria and urgency.  Musculoskeletal:  Negative for arthralgias and myalgias.  Neurological:  Negative for dizziness and headaches.  Psychiatric/Behavioral:  Negative for dysphoric mood.     Current Outpatient Medications on File Prior to Visit  Medication Sig Dispense Refill   allopurinol (ZYLOPRIM) 300 MG tablet TAKE 1 TABLET BY MOUTH EVERY DAY BEFORE BREAKFAST 90 tablet 1   Budeson-Glycopyrrol-Formoterol (BREZTRI AEROSPHERE) 160-9-4.8 MCG/ACT AERO Inhale 2 puffs into the lungs 2 (two) times daily. 10.7 g 5   celecoxib (CELEBREX) 100 MG capsule Take 200 mg by mouth.     etanercept (ENBREL) 50 MG/ML injection Inject 50 mg into the skin once a week.     folic acid (FOLVITE) 1 MG tablet Take by mouth.     lisinopril (ZESTRIL) 5 MG tablet TAKE 1 TABLET BY MOUTH TWICE A DAY 180 tablet 0   methotrexate (RHEUMATREX) 2.5 MG tablet Take 15 mg by mouth once a week.     rosuvastatin (CRESTOR) 10  MG tablet TAKE 1 TABLET BY MOUTH EVERY DAY 90 tablet 0   No current facility-administered medications on file prior to visit.   Past Medical History:  Diagnosis Date   Acute cholangitis 05/14/2016   Arthritis    Rheumatoid arthritis   Choledocholithiasis 05/16/2016   DVT (deep venous thrombosis) ([place]) 02/10/2021       Gallstone pancreatitis 05/14/2016   Gout    Hearing loss of left ear due to cerumen impaction 02/09/2020   Hyperlipidemia    Hypertension    Pancreatitis 05/2016   hosp. [facility]   Pneumonia 05/2016   Past Surgical History:  Procedure Laterality Date   CATARACT EXTRACTION Bilateral 2022   CHOLECYSTECTOMY     CHOLECYSTECTOMY N/A 07/04/2016   Procedure: LAPAROSCOPIC CHOLECYSTECTOMY WITH INTRAOPERATIVE CHOLANGIOGRAM;  Surgeon: Jackolyn Confer, MD;  Location: [place];  Service: General;  Laterality: N/A;    ERCP N/A 05/16/2016   Procedure: ENDOSCOPIC RETROGRADE CHOLANGIOPANCREATOGRAPHY (ERCP);  Surgeon: Doran Stabler, MD;  Location: [facility];  Service: Endoscopy;  Laterality: N/A;   KNEE SURGERY Left 1997    Family History  Problem Relation Age of Onset   Anxiety disorder Father    Social History   Socioeconomic History   Marital status: Married    Spouse name: Gregary Signs   Number of children: Not on file   Years of education: Not on file   Highest education level: Not on file  Occupational History   Not on file  Tobacco Use   Smoking status: Some Days    Packs/day: 0.30    Years: 53.00    Total pack years: 15.90    Types: Cigarettes    Start date: 10/17/1968   Smokeless tobacco: Never   Tobacco comments:    CT Screening ordered 03/15/22  Vaping Use   Vaping Use: Never used  Substance and Sexual Activity   Alcohol use: Yes    Alcohol/week: 1.0 standard drink of alcohol    Types: 1 Standard drinks or equivalent per week    Comment: ocasionally   Drug use: Never   Sexual activity: Yes    Partners: Female  Other Topics Concern   Not on file  Social History Narrative   Not on file   Social Determinants of Health   Financial Resource Strain: Low Risk  (05/04/2022)   Overall Financial Resource Strain (CARDIA)    Difficulty of Paying Living Expenses: Not hard at all  Food Insecurity: No Food Insecurity (03/15/2022)   Hunger Vital Sign    Worried About Running Out of Food in the Last Year: Never true    Ran Out of Food in the Last Year: Never true  Transportation Needs: No Transportation Needs (03/15/2022)   PRAPARE - Hydrologist (Medical): No    Lack of Transportation (Non-Medical): No  Physical Activity: Sufficiently Active (12/08/2022)   Exercise Vital Sign    Days of Exercise per Week: 5 days    Minutes of Exercise per Session: 30 min  Stress: No Stress Concern Present (12/08/2022)   [place]    Feeling of Stress : Not at all  Social Connections: Moderately Integrated (12/08/2022)   Social Connection and Isolation Panel [NHANES]    Frequency of Communication with Friends and Family: More than three times a week    Frequency of Social Gatherings with Friends and Family: More than three times a week    Attends Religious Services: More than  4 times per year    Active Member of Clubs or Organizations: No    Attends Archivist Meetings: Never    Marital Status: Married    Objective:  BP 132/68   Pulse 76   Temp 97.7 F (36.5 C)   Ht '5\' 7"'$  (1.702 m)   Wt 176 lb (79.8 kg)   SpO2 96%   BMI 27.57 kg/m      12/08/2022   10:41 AM 09/02/2022    7:47 AM 05/19/2022    8:01 AM  BP/Weight  Systolic BP [id number]  Diastolic BP 68 70 72  Wt. (Lbs) 176 168 161  BMI 27.57 kg/m2 26.31 kg/m2 25.22 kg/m2    Physical Exam Vitals reviewed.  Constitutional:      Appearance: Normal appearance.  Neck:     Vascular: No carotid bruit.  Cardiovascular:     Rate and Rhythm: Normal rate and regular rhythm.     Heart sounds: Normal heart sounds.  Pulmonary:     Effort: Pulmonary effort is normal.     Breath sounds: Normal breath sounds. No wheezing, rhonchi or rales.  Abdominal:     General: Bowel sounds are normal.     Palpations: Abdomen is soft.     Tenderness: There is no abdominal tenderness.  Musculoskeletal:        General: No swelling or tenderness. Normal range of motion.  Neurological:     Mental Status: He is alert and oriented to person, place, and time.  Psychiatric:        Mood and Affect: Mood normal.        Behavior: Behavior normal.     Diabetic Foot Exam - Simple   No data filed      Lab Results  Component Value Date   WBC 12.3 (H) 12/05/2022   HGB 16.9 12/05/2022   HCT 48.9 12/05/2022   PLT 223 12/05/2022   GLUCOSE 99 12/05/2022   CHOL 124 12/05/2022   TRIG 101 12/05/2022   HDL 51 12/05/2022   LDLCALC 54 12/05/2022    ALT 17 12/05/2022   AST 19 12/05/2022   NA 141 12/05/2022   K 4.7 12/05/2022   CL 104 12/05/2022   CREATININE 1.04 12/05/2022   BUN 15 12/05/2022   CO2 21 12/05/2022   TSH 1.860 09/02/2022   INR 0.97 05/14/2016      Assessment & Plan:    Essential hypertension, benign Assessment & Plan: Well controlled.  No changes to medicines. Taking Lisinopril 5 mg daily.  Continue to work on eating a healthy diet and exercise.  Labs drawn today.     Aortic atherosclerosis (HCC) Assessment & Plan: Continue rosuvastatin 10 mg before bed.  Recommend continue to work on eating healthy diet and exercise.    Erectile dysfunction, unspecified erectile dysfunction type Assessment & Plan: Prescription: sildenafil 20 mg 3-5 daily one hour prior to intercourse per 24 hours.     Cigarette nicotine dependence with nicotine-induced disorder  Rheumatoid arthritis involving multiple sites with positive rheumatoid factor (HCC) Assessment & Plan: Management per specialist. Prednisone 5 mg daily as needed for Arthritis flare.   Orders: -     predniSONE; Take 1 tablet (5 mg total) by mouth daily as needed.  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  Orders: -     predniSONE; Take 1 tablet (5  mg total) by mouth daily as needed.  Dispense: 30 tablet; Refill: 0  Other orders -     Sildenafil Citrate; 3-5 tablets once daily one hour prior to intercouse.  Dispense: 50 tablet; Refill: 0     Meds ordered this encounter  Medications   predniSONE (DELTASONE) 5 MG tablet    Sig: Take 1 tablet (5 mg total) by mouth daily as needed.    Dispense:  30 tablet    Refill:  0   sildenafil (REVATIO) 20 MG tablet    Sig: 3-5 tablets once daily one hour prior to intercouse.    Dispense:  50 tablet    Refill:  0    No orders of the defined types were placed in  this encounter.    Follow-up: Return in about 6 months (around 06/08/2023) for chronic fasting.  I,Marla I Leal-Borjas,acting as a scribe for [name], MD.,have documented all relevant documentation on the behalf of [name], MD,as directed by  [name], MD while in the presence of [name], MD.    An After Visit Summary was printed and given to the patient.  I attest that I have reviewed this visit and agree with the plan scribed by my staff.   [name], MD [facility] [phone]

## 2022-12-08 NOTE — Progress Notes (Cosign Needed)
12-08-2022: Left patient a VM to schedule follow up visit with [place] Constable and to confirm if he is receiving breztri through patient assistance. Patient called back and stated his insurance covers Breztri copay is $45 for a 30 day supply. Patient is ok with copay but would like to apply for patient assistance. Application completed/uploaded patient aware to fill out portions needed and to drop off to PCP with SSI statement. Follow up appointment scheduled for April.  [place] Pharmacist Assistant [phone]

## 2022-12-10 NOTE — Assessment & Plan Note (Signed)
Well controlled.  No changes to medicines. Taking Lisinopril 5 mg daily.  Continue to work on eating a healthy diet and exercise.  Labs drawn today.

## 2022-12-10 NOTE — Assessment & Plan Note (Addendum)
Sent prednisone

## 2022-12-10 NOTE — Assessment & Plan Note (Signed)
Management per specialist. Prednisone 5 mg daily as needed for Arthritis flare.

## 2022-12-10 NOTE — Assessment & Plan Note (Signed)
Continue rosuvastatin 10 mg before bed.  Recommend continue to work on eating healthy diet and exercise.

## 2022-12-11 NOTE — Assessment & Plan Note (Signed)
Prescription: sildenafil 20 mg 3-5 daily one hour prior to intercourse per 24 hours.

## 2022-12-19 ENCOUNTER — Ambulatory Visit (INDEPENDENT_AMBULATORY_CARE_PROVIDER_SITE_OTHER): Payer: PPO | Admitting: Physician Assistant

## 2022-12-19 ENCOUNTER — Encounter: Payer: Self-pay | Admitting: Physician Assistant

## 2022-12-19 VITALS — BP 122/78 | HR 98 | Temp 97.7°F | Ht 67.0 in | Wt 170.0 lb

## 2022-12-19 DIAGNOSIS — J441 Chronic obstructive pulmonary disease with (acute) exacerbation: Secondary | ICD-10-CM

## 2022-12-19 DIAGNOSIS — R051 Acute cough: Secondary | ICD-10-CM | POA: Diagnosis not present

## 2022-12-19 LAB — POCT INFLUENZA A/B
Influenza A, POC: NEGATIVE
Influenza B, POC: NEGATIVE

## 2022-12-19 LAB — POC COVID19 BINAXNOW: SARS Coronavirus 2 Ag: NEGATIVE

## 2022-12-19 MED ORDER — ALBUTEROL SULFATE HFA 108 (90 BASE) MCG/ACT IN AERS: 2.0000 | INHALATION_SPRAY | Freq: Four times a day (QID) | RESPIRATORY_TRACT | 2 refills | Status: DC | PRN

## 2022-12-19 MED ORDER — PREDNISONE 20 MG PO TABS: ORAL_TABLET | ORAL | 0 refills | Status: AC

## 2022-12-19 MED ORDER — AZITHROMYCIN 250 MG PO TABS: ORAL_TABLET | ORAL | 0 refills | Status: AC

## 2022-12-19 MED ORDER — TRIAMCINOLONE ACETONIDE 40 MG/ML IJ SUSP
60.0000 mg | Freq: Once | INTRAMUSCULAR | Status: AC
  Administered 2022-12-19: 60 mg via INTRAMUSCULAR

## 2022-12-19 NOTE — Progress Notes (Signed)
Acute Office Visit  Subjective:    Patient ID: Gerald Odom, male    DOB: [birth year], 72 y.o.   MRN: [id number]  Chief Complaint  Patient presents with   Cough    HPI Patient is in today for complaints of cough, congestion and wheezing since last week.  Symptoms worsened after working in yard mowing and weedeating.  Said he did get some dyspnea with wheezing while outside but denies chest pain or pressure.  He does take breztri bid but does not have rescue inhaler. He has had mostly clear productive cough and lots of sinus congestion  Past Medical History:  Diagnosis Date   Acute cholangitis 05/14/2016   Arthritis    Rheumatoid arthritis   Choledocholithiasis 05/16/2016   DVT (deep venous thrombosis) ([place]) 02/10/2021       Gallstone pancreatitis 05/14/2016   Gout    Hearing loss of left ear due to cerumen impaction 02/09/2020   Hyperlipidemia    Hypertension    Pancreatitis 05/2016   hosp. [facility]   Pneumonia 05/2016    Past Surgical History:  Procedure Laterality Date   CATARACT EXTRACTION Bilateral 2022   CHOLECYSTECTOMY     CHOLECYSTECTOMY N/A 07/04/2016   Procedure: LAPAROSCOPIC CHOLECYSTECTOMY WITH INTRAOPERATIVE CHOLANGIOGRAM;  Surgeon: Jackolyn Confer, MD;  Location: [place];  Service: General;  Laterality: N/A;   ERCP N/A 05/16/2016   Procedure: ENDOSCOPIC RETROGRADE CHOLANGIOPANCREATOGRAPHY (ERCP);  Surgeon: Doran Stabler, MD;  Location: [facility];  Service: Endoscopy;  Laterality: N/A;   KNEE SURGERY Left 1997    Family History  Problem Relation Age of Onset   Anxiety disorder Father     Social History   Socioeconomic History   Marital status: Married    Spouse name: Gregary Signs   Number of children: Not on file   Years of education: Not on file   Highest education level: Not on file  Occupational History   Not on file  Tobacco Use   Smoking status: Some Days    Packs/day: 0.30    Years: 53.00    Total pack years: 15.90    Types: Cigarettes     Start date: 10/17/1968   Smokeless tobacco: Never   Tobacco comments:    CT Screening ordered 03/15/22  Vaping Use   Vaping Use: Never used  Substance and Sexual Activity   Alcohol use: Yes    Alcohol/week: 1.0 standard drink of alcohol    Types: 1 Standard drinks or equivalent per week    Comment: ocasionally   Drug use: Never   Sexual activity: Yes    Partners: Female  Other Topics Concern   Not on file  Social History Narrative   Not on file   Social Determinants of Health   Financial Resource Strain: Low Risk  (05/04/2022)   Overall Financial Resource Strain (CARDIA)    Difficulty of Paying Living Expenses: Not hard at all  Food Insecurity: No Food Insecurity (03/15/2022)   Hunger Vital Sign    Worried About Running Out of Food in the Last Year: Never true    Ran Out of Food in the Last Year: Never true  Transportation Needs: No Transportation Needs (03/15/2022)   PRAPARE - Hydrologist (Medical): No    Lack of Transportation (Non-Medical): No  Physical Activity: Sufficiently Active (12/08/2022)   Exercise Vital Sign    Days of Exercise per Week: 5 days    Minutes of Exercise per Session: 30 min  Stress: No Stress Concern Present (12/08/2022)   [place]    Feeling of Stress : Not at all  Social Connections: Moderately Integrated (12/08/2022)   Social Connection and Isolation Panel [NHANES]    Frequency of Communication with Friends and Family: More than three times a week    Frequency of Social Gatherings with Friends and Family: More than three times a week    Attends Religious Services: More than 4 times per year    Active Member of [employer] or Organizations: No    Attends Archivist Meetings: Never    Marital Status: Married  Human resources officer Violence: Not At Risk (03/15/2022)   Humiliation, Afraid, Rape, and Kick questionnaire    Fear of Current or Ex-Partner: No     Emotionally Abused: No    Physically Abused: No    Sexually Abused: No     Current Outpatient Medications:    albuterol (VENTOLIN HFA) 108 (90 Base) MCG/ACT inhaler, Inhale 2 puffs into the lungs every 6 (six) hours as needed for wheezing or shortness of breath., Disp: 8 g, Rfl: 2   allopurinol (ZYLOPRIM) 300 MG tablet, TAKE 1 TABLET BY MOUTH EVERY DAY BEFORE BREAKFAST, Disp: 90 tablet, Rfl: 1   azithromycin (ZITHROMAX) 250 MG tablet, Take 2 tablets on day 1, then 1 tablet daily on days 2 through 5, Disp: 6 tablet, Rfl: 0   Budeson-Glycopyrrol-Formoterol (BREZTRI AEROSPHERE) 160-9-4.8 MCG/ACT AERO, Inhale 2 puffs into the lungs 2 (two) times daily., Disp: 10.7 g, Rfl: 5   celecoxib (CELEBREX) 100 MG capsule, Take 200 mg by mouth., Disp: , Rfl:    etanercept (ENBREL) 50 MG/ML injection, Inject 50 mg into the skin once a week., Disp: , Rfl:    folic acid (FOLVITE) 1 MG tablet, Take by mouth., Disp: , Rfl:    lisinopril (ZESTRIL) 5 MG tablet, TAKE 1 TABLET BY MOUTH TWICE A DAY, Disp: 180 tablet, Rfl: 0   methotrexate (RHEUMATREX) 2.5 MG tablet, Take 15 mg by mouth once a week., Disp: , Rfl:    predniSONE (DELTASONE) 20 MG tablet, Take 3 tablets (60 mg total) by mouth daily with breakfast for 3 days, THEN 2 tablets (40 mg total) daily with breakfast for 3 days, THEN 1 tablet (20 mg total) daily with breakfast for 3 days., Disp: 18 tablet, Rfl: 0   predniSONE (DELTASONE) 5 MG tablet, Take 1 tablet (5 mg total) by mouth daily as needed., Disp: 30 tablet, Rfl: 0   rosuvastatin (CRESTOR) 10 MG tablet, TAKE 1 TABLET BY MOUTH EVERY DAY, Disp: 90 tablet, Rfl: 0   sildenafil (REVATIO) 20 MG tablet, 3-5 tablets once daily one hour prior to intercouse., Disp: 50 tablet, Rfl: 0  Current Facility-Administered Medications:    triamcinolone acetonide (KENALOG-40) injection 60 mg, 60 mg, Intramuscular, Once, Marge Duncans, PA-C   No Known Allergies  CONSTITUTIONAL: Negative for chills, fatigue, fever,  E/N/T:  see HPI CARDIOVASCULAR: Negative for chest pain, dizziness,  RESPIRATORY: see HPI GASTROINTESTINAL: Negative for abdominal pain, acid reflux symptoms, constipation, diarrhea, nausea and vomiting.       Objective:    PHYSICAL EXAM:   VS: BP 122/78 (BP Location: Left Arm, Patient Position: Sitting)   Pulse 98   Temp 97.7 F (36.5 C) (Temporal)   Ht '5\' 7"'$  (1.702 m)   Wt 170 lb (77.1 kg)   SpO2 91%   BMI 26.63 kg/m   GEN: Well nourished, well developed, in no acute distress  HEENT: normal external ears and nose - normal external auditory canals and TMS -  - Lips, Teeth and Gums - normal  Oropharynx - mild erythema Cardiac: RRR; no murmurs, rubs Respiratory:  scattered wheezes both lung fields Skin: warm and dry, no rash   Office Visit on 12/19/2022  Component Date Value Ref Range Status   SARS Coronavirus 2 Ag 12/19/2022 Negative  Negative Final   Influenza A, POC 12/19/2022 Negative  Negative Final   Influenza B, POC 12/19/2022 Negative  Negative Final     Wt Readings from Last 3 Encounters:  12/19/22 170 lb (77.1 kg)  12/08/22 176 lb (79.8 kg)  09/02/22 168 lb (76.2 kg)    Health Maintenance Due  Topic Date Due   Zoster Vaccines- Shingrix (1 of 2) Never done    There are no preventive care reminders to display for this patient.        Assessment & Plan:   Problem List Items Addressed This Visit   None Visit Diagnoses     Acute cough    -  Primary   Relevant Orders   POC COVID-19 BinaxNow (Completed)   POCT Influenza A/B (Completed)   COPD with acute exacerbation (HCC)       Relevant Medications   triamcinolone acetonide (KENALOG-40) injection 60 mg (Start on 12/19/2022  4:15 PM)   predniSONE (DELTASONE) 20 MG tablet   azithromycin (ZITHROMAX) 250 MG tablet   albuterol (VENTOLIN HFA) 108 (90 Base) MCG/ACT inhaler        Meds ordered this encounter  Medications   triamcinolone acetonide (KENALOG-40) injection 60 mg   predniSONE (DELTASONE) 20 MG  tablet    Sig: Take 3 tablets (60 mg total) by mouth daily with breakfast for 3 days, THEN 2 tablets (40 mg total) daily with breakfast for 3 days, THEN 1 tablet (20 mg total) daily with breakfast for 3 days.    Dispense:  18 tablet    Refill:  0    Order Specific Question:   Supervising Provider    Answer:   Shelton Silvas   azithromycin (ZITHROMAX) 250 MG tablet    Sig: Take 2 tablets on day 1, then 1 tablet daily on days 2 through 5    Dispense:  6 tablet    Refill:  0    Order Specific Question:   Supervising Provider    Answer:   Shelton Silvas   albuterol (VENTOLIN HFA) 108 (90 Base) MCG/ACT inhaler    Sig: Inhale 2 puffs into the lungs every 6 (six) hours as needed for wheezing or shortness of breath.    Dispense:  8 g    Refill:  2    Order Specific Question:   Supervising Provider    Answer:   COX, KIRSTEN [id number]     [place], PA-C

## 2022-12-28 ENCOUNTER — Other Ambulatory Visit: Payer: Self-pay | Admitting: Family Medicine

## 2022-12-29 ENCOUNTER — Telehealth: Payer: Self-pay

## 2022-12-29 NOTE — Progress Notes (Unsigned)
Care Management & Coordination Services Pharmacy Team  Reason for Encounter: Hypertension  Contacted patient to discuss hypertension disease state. {US HC Outreach:28874}    Current antihypertensive regimen:  Lisinopril 5 mg daily  Patient verbally confirms he is taking the above medications as directed. {yes/no:20286}  How often are you checking your Blood Pressure? {CHL HP BP Monitoring Frequency:9731075241}  he checks his blood pressure {timing:25218} {before/after:25217} taking his medication.  Current home BP readings: *** Wrist or arm cuff: Caffeine intake: Salt intake: OTC medications including pseudoephedrine or NSAIDs?  Any readings above 180/100? {yes/no:20286} If yes any symptoms of hypertensive emergency? {hypertensive emergency symptoms:25354}  What recent interventions/DTPs have been made by any provider to improve Blood Pressure control since last CPP Visit: ***  Any recent hospitalizations or ED visits since last visit with CPP? {yes/no:20286}  What diet changes have been made to improve Blood Pressure Control?  ***  What exercise is being done to improve your Blood Pressure Control?  ***  Adherence Review: Is the patient currently on ACE/ARB medication? {yes/no:20286} Does the patient have >5 day gap between last estimated fill dates? {yes/no:20286}  Star Rating Drugs:  Lisinopril 5 mg- Last filled 10-24-2022 90 DS. Previous 07-22-2022 90 DS Rosuvastatin  mg- Last filled 12-28-2022 90 DS. Previous 10-02-2022 90 DS  Chart Updates: Recent office visits:  12-19-2022 Marge Duncans, PA-C. Visit for acute cough. Negative covid test. START albuterol 2 puffs every 6 hours PRN, Zithromax 250 mg Take 2 tablets on day 1, then 1 tablet daily on days 2 through 5 and prednisone 20 mg refer to directions. Kenelog injection given.  12-08-2022 [name], MD. Start prednisone 5 mg daily PRN and revatio 3-5 tablets  once daily one hour prior to intercouse.    09-02-2022 CoxElnita Maxwell, MD. MCV= 100, MCH= 33.7, Lymphocytes Absolute= 3.2. Glucose= 104, Uric acid= 3.6. Flu vaccine given.  Recent consult visits:  10-11-2022 Donney Rankins, PA ([facility]). Visit for headache. Negative flu and covid test. START prednisone 20 mg Take 2 tablets (40 mg total) by mouth daily in the morning for 5 days, breztri Inhale 2 puffs in the morning and 2 puffs before bedtime, albuterol Inhale 2 puffs every 4 (four) hours as needed, doxycycline 100 mg Take 1 capsule (100 mg total) by mouth in the morning and 1 capsule (100 mg total) before bedtime. Do all this for 7 days.   10-03-2022 Shirlyn Goltz (Rheumatology). Unable to view encounter.  Hospital visits:  None in previous 6 months  Medications: Outpatient Encounter Medications as of 12/29/2022  Medication Sig   albuterol (VENTOLIN HFA) 108 (90 Base) MCG/ACT inhaler Inhale 2 puffs into the lungs every 6 (six) hours as needed for wheezing or shortness of breath.   allopurinol (ZYLOPRIM) 300 MG tablet TAKE 1 TABLET BY MOUTH EVERY DAY BEFORE BREAKFAST   Budeson-Glycopyrrol-Formoterol (BREZTRI AEROSPHERE) 160-9-4.8 MCG/ACT AERO Inhale 2 puffs into the lungs 2 (two) times daily.   celecoxib (CELEBREX) 100 MG capsule Take 200 mg by mouth.   etanercept (ENBREL) 50 MG/ML injection Inject 50 mg into the skin once a week.   folic acid (FOLVITE) 1 MG tablet Take by mouth.   lisinopril (ZESTRIL) 5 MG tablet TAKE 1 TABLET BY MOUTH TWICE A DAY   methotrexate (RHEUMATREX) 2.5 MG tablet Take 15 mg by mouth once a week.   predniSONE (DELTASONE) 5 MG tablet Take 1 tablet (5 mg total) by mouth daily as needed.   rosuvastatin (CRESTOR) 10 MG tablet TAKE 1 TABLET  BY MOUTH EVERY DAY   sildenafil (REVATIO) 20 MG tablet 3-5 tablets once daily one hour prior to intercouse.   No facility-administered encounter medications on file as of 12/29/2022.    Recent Office Vitals: BP Readings from Last 3 Encounters:  12/19/22  122/78  12/08/22 132/68  09/02/22 120/70   Pulse Readings from Last 3 Encounters:  12/19/22 98  12/08/22 76  09/02/22 79    Wt Readings from Last 3 Encounters:  12/19/22 170 lb (77.1 kg)  12/08/22 176 lb (79.8 kg)  09/02/22 168 lb (76.2 kg)     Kidney Function Lab Results  Component Value Date/Time   CREATININE 1.04 12/05/2022 07:40 AM   CREATININE 0.93 09/02/2022 08:38 AM   GFRNONAA 65 12/01/2020 08:28 AM   GFRAA 75 12/01/2020 08:28 AM       Latest Ref Rng & Units 12/05/2022    7:40 AM 09/02/2022    8:38 AM 05/19/2022    8:54 AM  BMP  Glucose 70 - 99 mg/dL 99  104  101   BUN 8 - 27 mg/dL 15  10  11    Creatinine 0.76 - 1.27 mg/dL 1.04  0.93  1.03   BUN/Creat Ratio 10 - 24 14  11  11    Sodium 134 - 144 mmol/L 141  141  141   Potassium 3.5 - 5.2 mmol/L 4.7  4.7  4.8   Chloride 96 - 106 mmol/L 104  103  105   CO2 20 - 29 mmol/L 21  21  22    Calcium 8.6 - 10.2 mg/dL 9.1  9.2  9.2   12-29-2022: 1st attempt left VM 01-02-2023: 2nd attempt left VM  [place] Clinical Pharmacist Assistant [phone]

## 2023-01-18 ENCOUNTER — Other Ambulatory Visit: Payer: Self-pay | Admitting: Family Medicine

## 2023-01-20 ENCOUNTER — Other Ambulatory Visit: Payer: Self-pay | Admitting: Family Medicine

## 2023-01-27 ENCOUNTER — Telehealth: Payer: Self-pay

## 2023-01-27 NOTE — Progress Notes (Signed)
Care Management & Coordination Services Pharmacy Team  Reason for Encounter: Appointment Reminder  Contacted patient to confirm telephone appointment with Artelia Laroche, PharmD on 01/31/23 at 11:00 am.  Unsuccessful outreach. Left voicemail for patient to return call.   Chart review:  Recent office visits:  None  Recent consult visits:  None  Hospital visits:  None   Star Rating Drugs:  Medication:  Last Fill: Day Supply Lisinopril   01/20/23-10/24/22  90ds Rosuvastatin   12/28/22-10/02/22 90ds  Care Gaps: Annual wellness visit in last year? Yes  If Diabetic:None noted  Last eye exam / retinopathy screening: Last diabetic foot exam:   Roxana Hires, [facility] Clinical Pharmacist Assistant  [phone]

## 2023-01-31 ENCOUNTER — Ambulatory Visit: Payer: PPO

## 2023-01-31 NOTE — Patient Outreach (Signed)
Care Management & Coordination Services Pharmacy Note  01/31/2023 Name:  Gerald Odom MRN:  [id number] DOB:  11/21/1950  Summary: Pleasant 72 year old male presents for f/u CCM visit. He used to work for a tobacco company for many years  Recommendations/Changes made from today's visit: -None   Subjective: Gerald Odom is an 72 y.o. year old male who is a primary patient of Cox, Kirsten, MD.  The care coordination team was consulted for assistance with disease management and care coordination needs.    Engaged with patient by telephone for follow up visit.  Recent office visits:  None   Recent consult visits:  None   Hospital visits:  None   Objective:  Lab Results  Component Value Date   CREATININE 1.04 12/05/2022   BUN 15 12/05/2022   EGFR 76 12/05/2022   GFRNONAA 65 12/01/2020   GFRAA 75 12/01/2020   NA 141 12/05/2022   K 4.7 12/05/2022   CALCIUM 9.1 12/05/2022   CO2 21 12/05/2022   GLUCOSE 99 12/05/2022    No results found for: "HGBA1C", "FRUCTOSAMINE", "GFR", "MICROALBUR"  Last diabetic Eye exam: No results found for: "HMDIABEYEEXA"  Last diabetic Foot exam: No results found for: "HMDIABFOOTEX"   Lab Results  Component Value Date   CHOL 124 12/05/2022   HDL 51 12/05/2022   LDLCALC 54 12/05/2022   TRIG 101 12/05/2022   CHOLHDL 2.4 12/05/2022       Latest Ref Rng & Units 12/05/2022    7:40 AM 09/02/2022    8:38 AM 05/19/2022    8:54 AM  Hepatic Function  Total Protein 6.0 - 8.5 g/dL 6.8  7.2  6.9   Albumin 3.8 - 4.8 g/dL 4.1  4.4  4.2   AST 0 - 40 IU/L 19  19  20    ALT 0 - 44 IU/L 17  16  17    Alk Phosphatase 44 - 121 IU/L 87  94  92   Total Bilirubin 0.0 - 1.2 mg/dL 0.7  0.7  0.7     Lab Results  Component Value Date/Time   TSH 1.860 09/02/2022 08:39 AM   TSH 1.880 01/30/2020 09:23 AM       Latest Ref Rng & Units 12/05/2022    7:40 AM 09/02/2022    8:38 AM 05/19/2022    8:54 AM  CBC  WBC 3.4 - 10.8 x10E3/uL 12.3  8.2  9.1   Hemoglobin  13.0 - 17.7 g/dL 40.8  14.4  81.8   Hematocrit 37.5 - 51.0 % 48.9  49.3  46.4   Platelets 150 - 450 x10E3/uL 223  217  203     No results found for: "VD25OH", "VITAMINB12"  Clinical ASCVD: Yes  The ASCVD Risk score (Arnett DK, et al., 2019) failed to calculate for the following reasons:   The valid total cholesterol range is 130 to 320 mg/dL    Other: ([id number] if Afib, MMRC or CAT for COPD, ACT, DEXA)     12/08/2022   10:42 AM 03/15/2022    9:12 AM 11/18/2021    8:04 AM  Depression screen PHQ 2/9  Decreased Interest 0 0 0  Down, Depressed, Hopeless 0 0 0  PHQ - 2 Score 0 0 0     Social History   Tobacco Use  Smoking Status Some Days   Packs/day: 0.30   Years: 53.00   Additional pack years: 0.00   Total pack years: 15.90   Types: Cigarettes   Start date: 10/17/1968  Smokeless Tobacco Never  Tobacco Comments   CT Screening ordered 03/15/22   BP Readings from Last 3 Encounters:  12/19/22 122/78  12/08/22 132/68  09/02/22 120/70   Pulse Readings from Last 3 Encounters:  12/19/22 98  12/08/22 76  09/02/22 79   Wt Readings from Last 3 Encounters:  12/19/22 170 lb (77.1 kg)  12/08/22 176 lb (79.8 kg)  09/02/22 168 lb (76.2 kg)   BMI Readings from Last 3 Encounters:  12/19/22 26.63 kg/m  12/08/22 27.57 kg/m  09/02/22 26.31 kg/m    No Known Allergies  Medications Reviewed Today     Reviewed by Marianne Sofia, PA-C (Physician Assistant) on 12/19/22 at 1555  Med List Status: <None>   Medication Order Taking? Sig Documenting Provider Last Dose Status Informant  allopurinol (ZYLOPRIM) 300 MG tablet [id number] Yes TAKE 1 TABLET BY MOUTH EVERY DAY BEFORE BREAKFAST Janie Morning, NP Taking Active   Budeson-Glycopyrrol-Formoterol (BREZTRI AEROSPHERE) 160-9-4.8 MCG/ACT AERO [id number] Yes Inhale 2 puffs into the lungs 2 (two) times daily. Cox, Kirsten, MD Taking Active   celecoxib (CELEBREX) 100 MG capsule [id number] Yes Take 200 mg by mouth. [provider]  Taking Active   etanercept (ENBREL) 50 MG/ML injection [id number] Yes Inject 50 mg into the skin once a week. [provider] Taking Active   folic acid (FOLVITE) 1 MG tablet [id number] Yes Take by mouth. [provider] Taking Active   lisinopril (ZESTRIL) 5 MG tablet [id number] Yes TAKE 1 TABLET BY MOUTH TWICE A DAY Cox, Kirsten, MD Taking Active   methotrexate (RHEUMATREX) 2.5 MG tablet [id number] Yes Take 15 mg by mouth once a week. [provider] Taking Active   predniSONE (DELTASONE) 5 MG tablet [id number] Yes Take 1 tablet (5 mg total) by mouth daily as needed. Cox, Kirsten, MD Taking Active   rosuvastatin (CRESTOR) 10 MG tablet [id number] Yes TAKE 1 TABLET BY MOUTH EVERY DAY Cox, Kirsten, MD Taking Active   sildenafil (REVATIO) 20 MG tablet [id number] Yes 3-5 tablets once daily one hour prior to intercouse. CoxFritzi Mandes, MD Taking Active             SDOH:  (Social Determinants of Health) assessments and interventions performed: Yes SDOH Interventions    Flowsheet Row Care Coordination from 01/31/2023 in [facility] Office Visit from 12/08/2022 in [facility] Chronic Care Management from 05/04/2022 in [facility] Office Visit from 03/15/2022 in [facility]  SDOH Interventions      Food Insecurity Interventions -- -- -- Intervention Not Indicated  Housing Interventions -- -- -- Intervention Not Indicated  Transportation Interventions Intervention Not Indicated -- -- Intervention Not Indicated  Utilities Interventions -- Intervention Not Indicated -- --  Financial Strain Interventions Intervention Not Indicated -- Intervention Not Indicated --  Physical Activity Interventions -- Intervention Not Indicated -- --  Stress Interventions -- Intervention Not Indicated -- --  Social Connections Interventions -- Intervention Not Indicated -- --       Medication Assistance: None required.  Patient  affirms current coverage meets needs.   Name and location of Current pharmacy:  [facility] [address], [place] - [address] [place] [address] Phone: [phone] Fax: [phone]    Star Rating Drugs:  Medication:                Last Fill:         Day Supply Lisinopril  01/20/23-10/24/22               90ds Rosuvastatin               12/28/22-10/02/22         90ds   Care Gaps: Annual wellness visit in last year? Yes   Assessment/Plan  Hypertension (BP goal <140/90) BP Readings from Last 3 Encounters:  12/19/22 122/78  12/08/22 132/68  09/02/22 120/70    Pulse Readings from Last 3 Encounters:  12/19/22 98  12/08/22 76  09/02/22 79   -Controlled -Current treatment: Lisinopril 5 mg twice daily Appropriate, Effective, Safe, Accessible -Medications previously tried: none reported -Current home readings: not checking regularly -Current dietary habits: doesn't follow specific diet. Doesn't add salt to food.  -Current exercise habits: Stays active working in yard.  -Denies hypotensive/hypertensive symptoms -Educated on BP goals and benefits of medications for prevention of heart attack, stroke and kidney damage; Daily salt intake goal < 2300 mg; Exercise goal of 150 minutes per week; -Counseled to monitor BP at home weekly, document, and provide log at future appointments -Counseled on diet and exercise extensively Recommended to continue current medication   COPD (Goal: control symptoms and prevent exacerbations) -Controlled -Current treatment  Breztri 2 puffs twice daily Appropriate, Effective, Safe, Accessible Albuterol Appropriate, Effective, Safe, Accessible -Medications previously tried: none repor -MMRC/CAT score:  July 2022: 4 (low)     No data to display         PF Readings from Last 3 Encounters:  No data found for PF   Tobacco Use: High Risk (12/19/2022)   Patient History    Smoking Tobacco Use:  Some Days    Smokeless Tobacco Use: Never    Passive Exposure: Not on file   -Exacerbations requiring treatment in last 6 months: none reported -Patient reports consistent use of maintenance inhaler -Frequency of rescue inhaler use: n/a -Counseled on Benefits of consistent maintenance inhaler use -Counseled on option of patient assistance for Breztri if cost becomes too expensive. Patient reports breathing well controlled.  July 2023: Patient told me he was sent to get a CT and there was an eraser sized growth. He was told to come back in August and we'll re-assess. He is very stressed and nervous about this. He hasn't told his partner/family about the mass because he's so worried. He is unsure why he has to wait until August for a f/u. He was wondering if someone could call and explain why he has to wait or if he could be seen/assessed sooner. Will ask PCP to reach out -Smoking 1/2PPD since scan April 2024: Smoking 1/2PPD, no interest in quitting. Using Albuterol 1/week normally (Using more often this week with pollen). States Markus Daft is $5   Gout (Goal: Prevent gout flares) Lab Results  Component Value Date   LABURIC 3.6 (L) 09/02/2022  -Controlled -Last Gout Flare: N/A -Current treatment  Allopurnol  Appropriate, Effective, Safe, Accessible -Medications previously tried: N/A  -We discussed:  Counseled patient on low purine diet plan. Counseled patient to reduce consumption of high-fructose corn syrup, sweetened soft drinks, fruit juices, meat, and seafood. -Recommended to continue current medication  CP F/U PRN  Artelia Laroche, Pharm.D. - [phone]

## 2023-02-08 DIAGNOSIS — Z79899 Other long term (current) drug therapy: Secondary | ICD-10-CM | POA: Diagnosis not present

## 2023-02-08 DIAGNOSIS — M0579 Rheumatoid arthritis with rheumatoid factor of multiple sites without organ or systems involvement: Secondary | ICD-10-CM | POA: Diagnosis not present

## 2023-02-08 DIAGNOSIS — M1A09X0 Idiopathic chronic gout, multiple sites, without tophus (tophi): Secondary | ICD-10-CM | POA: Diagnosis not present

## 2023-02-18 ENCOUNTER — Other Ambulatory Visit: Payer: Self-pay | Admitting: Family Medicine

## 2023-02-21 ENCOUNTER — Telehealth: Payer: Self-pay

## 2023-02-21 NOTE — Telephone Encounter (Signed)
PA not submitted to insurance due to medical criteria not being met for coverage of sildenafil.

## 2023-03-02 ENCOUNTER — Other Ambulatory Visit: Payer: Self-pay | Admitting: Physician Assistant

## 2023-03-02 DIAGNOSIS — J441 Chronic obstructive pulmonary disease with (acute) exacerbation: Secondary | ICD-10-CM

## 2023-03-27 ENCOUNTER — Telehealth: Payer: Self-pay | Admitting: *Deleted

## 2023-03-27 NOTE — Patient Outreach (Signed)
  Care Coordination   03/27/2023 Name: Gerald Odom MRN: [id number] DOB: Jan 05, 1951   Care Coordination Outreach Attempts:  An unsuccessful telephone outreach was attempted today to offer the patient information about available care coordination services.  Follow Up Plan:  Additional outreach attempts will be made to offer the patient care coordination information and services.   Encounter Outcome:  No Answer   Care Coordination Interventions:  No, not indicated    Reece Levy, MSW, LCSW Clinical Social Worker [employer] [employer] [facility] [phone]

## 2023-03-28 ENCOUNTER — Other Ambulatory Visit: Payer: Self-pay | Admitting: Family Medicine

## 2023-04-04 ENCOUNTER — Telehealth: Payer: Self-pay | Admitting: Family Medicine

## 2023-04-04 NOTE — Telephone Encounter (Signed)
LEFT MESSAGE FOR PT TO SCHEDULE MEDICARE ANNUAL WELLNESS

## 2023-04-05 ENCOUNTER — Telehealth: Payer: Self-pay | Admitting: *Deleted

## 2023-04-05 NOTE — Patient Outreach (Signed)
  Care Coordination   04/05/2023 Name: Gerald Odom MRN: [id number] DOB: [birth year]   Care Coordination Outreach Attempts:  A second unsuccessful outreach was attempted today to offer the patient with information about available care coordination services.  Follow Up Plan:  Additional outreach attempts will be made to offer the patient care coordination information and services.   Encounter Outcome:  No Answer   Care Coordination Interventions:  No, not indicated    Reece Levy, MSW, LCSW Clinical Social Worker [employer] [facility] [phone]

## 2023-04-10 ENCOUNTER — Telehealth: Payer: Self-pay | Admitting: *Deleted

## 2023-04-10 NOTE — Patient Outreach (Signed)
  Care Coordination   04/10/2023 Name: Gerald Odom MRN: [id number] DOB: Feb 08, 1951   Care Coordination Outreach Attempts:  A third unsuccessful outreach was attempted today to offer the patient with information about available care coordination services.  Follow Up Plan:  No further outreach attempts will be made at this time. We have been unable to contact the patient to offer or enroll patient in care coordination services  Encounter Outcome:  No Answer   Care Coordination Interventions:  No, not indicated    Reece Levy, MSW, LCSW Clinical Social Worker [employer] [facility] [phone]

## 2023-04-17 ENCOUNTER — Telehealth: Payer: Self-pay

## 2023-04-17 NOTE — Telephone Encounter (Signed)
Contacted Gerald Odom to schedule their annual wellness visit. I left a message requesting to patient to call the office back to see about getting an appointment scheduled.

## 2023-04-18 ENCOUNTER — Other Ambulatory Visit: Payer: Self-pay | Admitting: Family Medicine

## 2023-05-15 DIAGNOSIS — H35373 Puckering of macula, bilateral: Secondary | ICD-10-CM | POA: Diagnosis not present

## 2023-05-15 DIAGNOSIS — H353130 Nonexudative age-related macular degeneration, bilateral, stage unspecified: Secondary | ICD-10-CM | POA: Diagnosis not present

## 2023-05-15 DIAGNOSIS — H43813 Vitreous degeneration, bilateral: Secondary | ICD-10-CM | POA: Diagnosis not present

## 2023-05-15 DIAGNOSIS — H35433 Paving stone degeneration of retina, bilateral: Secondary | ICD-10-CM | POA: Diagnosis not present

## 2023-05-15 DIAGNOSIS — H524 Presbyopia: Secondary | ICD-10-CM | POA: Diagnosis not present

## 2023-05-15 DIAGNOSIS — H31091 Other chorioretinal scars, right eye: Secondary | ICD-10-CM | POA: Diagnosis not present

## 2023-05-15 DIAGNOSIS — Z961 Presence of intraocular lens: Secondary | ICD-10-CM | POA: Diagnosis not present

## 2023-05-15 DIAGNOSIS — H26492 Other secondary cataract, left eye: Secondary | ICD-10-CM | POA: Diagnosis not present

## 2023-06-12 ENCOUNTER — Ambulatory Visit (INDEPENDENT_AMBULATORY_CARE_PROVIDER_SITE_OTHER): Payer: PPO | Admitting: Family Medicine

## 2023-06-12 ENCOUNTER — Encounter: Payer: Self-pay | Admitting: Family Medicine

## 2023-06-12 VITALS — BP 130/80 | HR 64 | Temp 97.2°F | Ht 67.0 in | Wt 166.0 lb

## 2023-06-12 DIAGNOSIS — I10 Essential (primary) hypertension: Secondary | ICD-10-CM

## 2023-06-12 DIAGNOSIS — J410 Simple chronic bronchitis: Secondary | ICD-10-CM | POA: Diagnosis not present

## 2023-06-12 DIAGNOSIS — M0579 Rheumatoid arthritis with rheumatoid factor of multiple sites without organ or systems involvement: Secondary | ICD-10-CM | POA: Diagnosis not present

## 2023-06-12 DIAGNOSIS — M1A0490 Idiopathic chronic gout, unspecified hand, without tophus (tophi): Secondary | ICD-10-CM | POA: Diagnosis not present

## 2023-06-12 DIAGNOSIS — F17219 Nicotine dependence, cigarettes, with unspecified nicotine-induced disorders: Secondary | ICD-10-CM | POA: Diagnosis not present

## 2023-06-12 DIAGNOSIS — I700 Atherosclerosis of aorta: Secondary | ICD-10-CM

## 2023-06-12 NOTE — Assessment & Plan Note (Signed)
Well controlled.  No changes to medicines. Taking Lisinopril 5 mg daily.  Continue to work on eating a healthy diet and exercise.  Labs drawn today.

## 2023-06-12 NOTE — Assessment & Plan Note (Signed)
Encouraged cessation. Patient is working on it.

## 2023-06-12 NOTE — Progress Notes (Signed)
Subjective:  Patient ID: Gerald Odom, male    DOB: [birth year]  Age: 72 y.o. MRN: [id number]  Chief Complaint  Patient presents with   Medical Management of Chronic Issues    HPI Hyperlipidemia/Aortic atherosclerosis: He is taking Rosuvastatin 10 mg daily. Eating healthy and exercises. Hypertension: Taking Lisinopril 5 mg daily. RA:  He takes Methotrexate 2.5 mg 15 mg weekly, Enbrel 50 mg injection weekly, celebrex 200 mg daily as needed. Well controlled.  Gout: Allopurinol 300 mg daily. No flare ups in a long time.  Tobacco Use: Currently smoking. Restarted 1/2 - 1 ppd.  COPD: on Breztri 2 puffs twice daily. No shortness of breath or coughing.      12/08/2022   10:42 AM 03/15/2022    9:12 AM 11/18/2021    8:04 AM 03/10/2021    8:12 AM 02/10/2021    1:56 PM  Depression screen PHQ 2/9  Decreased Interest 0 0 0 0 0  Down, Depressed, Hopeless 0 0 0 0 0  PHQ - 2 Score 0 0 0 0 0        12/08/2022   10:42 AM  Fall Risk   Falls in the past year? 0  Number falls in past yr: 0  Injury with Fall? 0  Risk for fall due to : No Fall Risks  Follow up Falls evaluation completed    Patient Care Team: Blane Ohara, MD as PCP - General (Family Medicine) Patterson Hammersmith, MD as Consulting Physician (Rheumatology) Zettie Pho, [facility] (Inactive) (Pharmacist)   Review of Systems  Constitutional:  Negative for chills, diaphoresis, fatigue and fever.  HENT:  Negative for congestion, ear pain and sore throat.   Respiratory:  Negative for cough and shortness of breath.   Cardiovascular:  Negative for chest pain and leg swelling.  Gastrointestinal:  Negative for abdominal pain, constipation, diarrhea, nausea and vomiting.  Genitourinary:  Negative for dysuria and urgency.  Musculoskeletal:  Negative for arthralgias and myalgias.  Neurological:  Negative for dizziness and headaches.  Psychiatric/Behavioral:  Negative for dysphoric mood.     Current Outpatient Medications on File Prior to  Visit  Medication Sig Dispense Refill   albuterol (VENTOLIN HFA) 108 (90 Base) MCG/ACT inhaler TAKE 2 PUFFS BY MOUTH EVERY 6 HOURS AS NEEDED FOR WHEEZE OR SHORTNESS OF BREATH 18 each 2   allopurinol (ZYLOPRIM) 300 MG tablet TAKE 1 TABLET BY MOUTH EVERY DAY BEFORE BREAKFAST 90 tablet 1   Budeson-Glycopyrrol-Formoterol (BREZTRI AEROSPHERE) 160-9-4.8 MCG/ACT AERO Inhale 2 puffs into the lungs 2 (two) times daily. 10.7 g 5   celecoxib (CELEBREX) 100 MG capsule Take 200 mg by mouth.     etanercept (ENBREL) 50 MG/ML injection Inject 50 mg into the skin once a week.     folic acid (FOLVITE) 1 MG tablet Take by mouth.     lisinopril (ZESTRIL) 5 MG tablet TAKE 1 TABLET BY MOUTH TWICE A DAY 180 tablet 0   methotrexate (RHEUMATREX) 2.5 MG tablet Take 15 mg by mouth once a week.     predniSONE (DELTASONE) 5 MG tablet Take 1 tablet (5 mg total) by mouth daily as needed. 30 tablet 0   rosuvastatin (CRESTOR) 10 MG tablet TAKE 1 TABLET BY MOUTH EVERY DAY 90 tablet 1   sildenafil (REVATIO) 20 MG tablet 3-5 TABLETS ONCE DAILY ONE HOUR PRIOR TO INTERCOUSE. 50 tablet 0   No current facility-administered medications on file prior to visit.   Past Medical History:  Diagnosis Date   Acute  cholangitis 05/14/2016   Arthritis    Rheumatoid arthritis   Choledocholithiasis 05/16/2016   DVT (deep venous thrombosis) (HCC) 02/10/2021       Gallstone pancreatitis 05/14/2016   Gout    Hearing loss of left ear due to cerumen impaction 02/09/2020   Hyperlipidemia    Hypertension    Pancreatitis 05/2016   [facility]osp. [facility]   Pneumonia 05/2016   Past Surgical History:  Procedure Laterality Date   CATARACT EXTRACTION Bilateral 2022   CHOLECYSTECTOMY     CHOLECYSTECTOMY N/A 07/04/2016   Procedure: LAPAROSCOPIC CHOLECYSTECTOMY WITH INTRAOPERATIVE CHOLANGIOGRAM;  Surgeon: Avel Peace, MD;  Location: [facility] OR;  Service: General;  Laterality: N/A;   ERCP N/A 05/16/2016   Procedure: ENDOSCOPIC RETROGRADE CHOLANGIOPANCREATOGRAPHY  (ERCP);  Surgeon: Sherrilyn Rist, MD;  Location: [facility];  Service: Endoscopy;  Laterality: N/A;   KNEE SURGERY Left 1997    Family History  Problem Relation Age of Onset   Anxiety disorder Father    Social History   Socioeconomic History   Marital status: Married    Spouse name: Amil Amen   Number of children: Not on file   Years of education: Not on file   Highest education level: Not on file  Occupational History   Not on file  Tobacco Use   Smoking status: Some Days    Current packs/day: 0.30    Average packs/day: 0.3 packs/day for 54.7 years (16.4 ttl pk-yrs)    Types: Cigarettes    Start date: 10/17/1968   Smokeless tobacco: Never   Tobacco comments:    CT Screening ordered 03/15/22  Vaping Use   Vaping status: Never Used  Substance and Sexual Activity   Alcohol use: Yes    Alcohol/week: 1.0 standard drink of alcohol    Types: 1 Standard drinks or equivalent per week    Comment: ocasionally   Drug use: Never   Sexual activity: Yes    Partners: Female  Other Topics Concern   Not on file  Social History Narrative   Not on file   Social Determinants of Health   Financial Resource Strain: Low Risk  (01/31/2023)   Overall Financial Resource Strain (CARDIA)    Difficulty of Paying Living Expenses: Not hard at all  Food Insecurity: No Food Insecurity (03/15/2022)   Hunger Vital Sign    Worried About Running Out of Food in the Last Year: Never true    Ran Out of Food in the Last Year: Never true  Transportation Needs: No Transportation Needs (01/31/2023)   PRAPARE - Administrator, Civil Service (Medical): No    Lack of Transportation (Non-Medical): No  Physical Activity: Sufficiently Active (12/08/2022)   Exercise Vital Sign    Days of Exercise per Week: 5 days    Minutes of Exercise per Session: 30 min  Stress: No Stress Concern Present (12/08/2022)   [employer] of Occupational Health - Occupational Stress Questionnaire    Feeling of Stress :  Not at all  Social Connections: Moderately Integrated (12/08/2022)   Social Connection and Isolation Panel [NHANES]    Frequency of Communication with Friends and Family: More than three times a week    Frequency of Social Gatherings with Friends and Family: More than three times a week    Attends Religious Services: More than 4 times per year    Active Member of [employer] or Organizations: No    Attends Banker Meetings: Never    Marital Status: Married  Objective:  BP 130/80   Pulse 64   Temp (!) 97.2 F (36.2 C)   Ht 5\' 7"  (1.702 m)   Wt 166 lb (75.3 kg)   SpO2 96%   BMI 26.00 kg/m      06/12/2023   10:33 AM 12/19/2022    3:48 PM 12/08/2022   10:41 AM  BP/Weight  Systolic BP 130 122 132  Diastolic BP 80 78 68  Wt. (Lbs) 166 170 176  BMI 26 kg/m2 26.63 kg/m2 27.57 kg/m2    Physical Exam Vitals reviewed.  Constitutional:      Appearance: Normal appearance. He is normal weight.  Neck:     Vascular: No carotid bruit.  Cardiovascular:     Rate and Rhythm: Normal rate and regular rhythm.     Heart sounds: Normal heart sounds. No murmur heard. Pulmonary:     Effort: Pulmonary effort is normal.     Breath sounds: Normal breath sounds.  Abdominal:     General: Abdomen is flat. Bowel sounds are normal.     Palpations: Abdomen is soft.     Tenderness: There is no abdominal tenderness.  Neurological:     Mental Status: He is alert and oriented to person, place, and time.  Psychiatric:        Mood and Affect: Mood normal.        Behavior: Behavior normal.     Diabetic Foot Exam - Simple   No data filed      Lab Results  Component Value Date   WBC 12.3 (H) 12/05/2022   HGB 16.9 12/05/2022   HCT 48.9 12/05/2022   PLT 223 12/05/2022   GLUCOSE 99 12/05/2022   CHOL 124 12/05/2022   TRIG 101 12/05/2022   HDL 51 12/05/2022   LDLCALC 54 12/05/2022   ALT 17 12/05/2022   AST 19 12/05/2022   NA 141 12/05/2022   K 4.7 12/05/2022   CL 104 12/05/2022    CREATININE 1.04 12/05/2022   BUN 15 12/05/2022   CO2 21 12/05/2022   TSH 1.860 09/02/2022   INR 0.97 05/14/2016      Assessment & Plan:    Simple chronic bronchitis (HCC) Assessment & Plan: Recommend quit smoking, Continue Breztri 2 puffs twice daily.   Essential hypertension, benign Assessment & Plan: Well controlled.  No changes to medicines. Taking Lisinopril 5 mg daily.  Continue to work on eating a healthy diet and exercise.  Labs drawn today.    Orders: -     CBC with Differential/Platelet -     Comprehensive metabolic panel -     TSH  Cigarette nicotine dependence with nicotine-induced disorder Assessment & Plan: Encouraged cessation. Patient is working on it.    Rheumatoid arthritis involving multiple sites with positive rheumatoid factor (HCC) Assessment & Plan: Management per specialist. Continue methotrexate, folate, and Enbrel. Prednisone 5 mg daily as needed for Arthritis flare.  Send labs to Dr. Allena Katz with his rheumatologist.  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  Only uses prednisone 5 mg infrequently if has a flare.   Aortic atherosclerosis (HCC) Assessment & Plan: Cholesterols been well-controlled.  Continue Crestor 10 mg nightly.  Continue to eat healthy and exercise.      No orders of the defined types were placed in this encounter.   Orders  Placed This Encounter  Procedures   CBC with Differential/Platelet   Comprehensive metabolic panel   TSH     Follow-up: Return in about 6 months (around 12/13/2023) for chronic.   I,Katherina A Bramblett,acting as a scribe for Blane Ohara, MD.,have documented all relevant documentation on the behalf of Blane Ohara, MD,as directed by  Blane Ohara, MD while in the presence of Blane Ohara, MD.   An After Visit Summary was  printed and given to the patient.  Blane Ohara, MD [facility] [phone]

## 2023-06-12 NOTE — Assessment & Plan Note (Addendum)
Continue allopurinol.  Only uses prednisone 5 mg infrequently if has a flare.

## 2023-06-12 NOTE — Assessment & Plan Note (Signed)
Cholesterols been well-controlled.  Continue Crestor 10 mg nightly.  Continue to eat healthy and exercise.

## 2023-06-12 NOTE — Assessment & Plan Note (Signed)
Recommend quit smoking, Continue Breztri 2 puffs twice daily.

## 2023-06-12 NOTE — Patient Instructions (Signed)
Recommend shingrix vaccine series.

## 2023-06-12 NOTE — Assessment & Plan Note (Addendum)
Management per specialist. Continue methotrexate, folate, and Enbrel. Prednisone 5 mg daily as needed for Arthritis flare.  Send labs to Dr. Allena Katz with his rheumatologist.

## 2023-06-12 NOTE — Assessment & Plan Note (Signed)
Prescription: sildenafil 20 mg 3-5 daily one hour prior to intercourse per 24 hours.

## 2023-06-13 LAB — CBC WITH DIFFERENTIAL/PLATELET
Basophils Absolute: 0.1 10*3/uL (ref 0.0–0.2)
Basos: 1 %
EOS (ABSOLUTE): 0.2 10*3/uL (ref 0.0–0.4)
Eos: 2 %
Hematocrit: 47.9 % (ref 37.5–51.0)
Hemoglobin: 16.3 g/dL (ref 13.0–17.7)
Immature Grans (Abs): 0 10*3/uL (ref 0.0–0.1)
Immature Granulocytes: 0 %
Lymphocytes Absolute: 3.4 10*3/uL — ABNORMAL HIGH (ref 0.7–3.1)
Lymphs: 32 %
MCH: 33.7 pg — ABNORMAL HIGH (ref 26.6–33.0)
MCHC: 34 g/dL (ref 31.5–35.7)
MCV: 99 fL — ABNORMAL HIGH (ref 79–97)
Monocytes Absolute: 1.2 10*3/uL — ABNORMAL HIGH (ref 0.1–0.9)
Monocytes: 11 %
Neutrophils Absolute: 5.9 10*3/uL (ref 1.4–7.0)
Neutrophils: 54 %
Platelets: 191 10*3/uL (ref 150–450)
RBC: 4.83 x10E6/uL (ref 4.14–5.80)
RDW: 13.4 % (ref 11.6–15.4)
WBC: 10.7 10*3/uL (ref 3.4–10.8)

## 2023-06-13 LAB — COMPREHENSIVE METABOLIC PANEL
ALT: 16 IU/L (ref 0–44)
AST: 19 IU/L (ref 0–40)
Albumin: 4.1 g/dL (ref 3.8–4.8)
Alkaline Phosphatase: 104 IU/L (ref 44–121)
BUN/Creatinine Ratio: 11 (ref 10–24)
BUN: 10 mg/dL (ref 8–27)
Bilirubin Total: 0.3 mg/dL (ref 0.0–1.2)
CO2: 21 mmol/L (ref 20–29)
Calcium: 9.4 mg/dL (ref 8.6–10.2)
Chloride: 104 mmol/L (ref 96–106)
Creatinine, Ser: 0.91 mg/dL (ref 0.76–1.27)
Globulin, Total: 2.7 g/dL (ref 1.5–4.5)
Glucose: 98 mg/dL (ref 70–99)
Potassium: 4.7 mmol/L (ref 3.5–5.2)
Sodium: 138 mmol/L (ref 134–144)
Total Protein: 6.8 g/dL (ref 6.0–8.5)
eGFR: 88 mL/min/{1.73_m2} (ref >59)

## 2023-06-13 LAB — TSH: TSH: 1.6 u[IU]/mL (ref 0.450–4.500)

## 2023-07-05 ENCOUNTER — Other Ambulatory Visit: Payer: Self-pay | Admitting: Family Medicine

## 2023-07-05 ENCOUNTER — Ambulatory Visit (INDEPENDENT_AMBULATORY_CARE_PROVIDER_SITE_OTHER): Payer: PPO | Admitting: Family Medicine

## 2023-07-05 ENCOUNTER — Encounter: Payer: Self-pay | Admitting: Family Medicine

## 2023-07-05 VITALS — BP 124/72 | HR 88 | Temp 96.9°F | Resp 18 | Ht 67.5 in | Wt 164.6 lb

## 2023-07-05 DIAGNOSIS — Z0000 Encounter for general adult medical examination without abnormal findings: Secondary | ICD-10-CM | POA: Diagnosis not present

## 2023-07-05 DIAGNOSIS — Z23 Encounter for immunization: Secondary | ICD-10-CM | POA: Diagnosis not present

## 2023-07-05 DIAGNOSIS — F17218 Nicotine dependence, cigarettes, with other nicotine-induced disorders: Secondary | ICD-10-CM

## 2023-07-05 DIAGNOSIS — N529 Male erectile dysfunction, unspecified: Secondary | ICD-10-CM | POA: Diagnosis not present

## 2023-07-05 NOTE — Assessment & Plan Note (Addendum)
Labs drawn today Await labs/testing for assessment and recommendations.

## 2023-07-05 NOTE — Assessment & Plan Note (Signed)
Encouraged to stop smoking.  Patient is going to start working out at [employer] and hopes he can try to quit. Lung CT ordered

## 2023-07-05 NOTE — Progress Notes (Signed)
Subjective:   Gerald Odom is a 72 y.o. male who presents for Medicare Annual/Subsequent preventive examination.  Visit Complete: In person  HPI Patient Medicare AWV questionnaire was completed by the patient in house.  I  have confirmed that all information answered by patient is correct and no changes since this date. Patient signed up today to work out with his son at the [facility]. Patient states that he eats healthy most of the time. He is wanting to have his testosterone checked again. Denies chest pain or shortness of breath. Patient is going to get his flu vaccine today and will get his shingles vaccine at the pharmacy.  Review of Systems  Constitutional:  Negative for chills, fever and malaise/fatigue.  HENT:  Negative for congestion, sinus pain and sore throat.   Respiratory:  Negative for cough and shortness of breath.   Cardiovascular:  Negative for chest pain.  Gastrointestinal:  Negative for diarrhea, nausea and vomiting.  Genitourinary: Negative.   Musculoskeletal: Negative.   Neurological:  Negative for dizziness and headaches.  Psychiatric/Behavioral:  Negative for depression. The patient is not nervous/anxious.           Objective:    Today's Vitals   07/05/23 1056 07/05/23 1059 07/05/23 1132  BP: 124/72    Pulse: 88    Resp: 18    Temp: (!) 96.9 F (36.1 C)    Weight: 164 lb 9.6 oz (74.7 kg)    Height: 5' 7.5" (1.715 m)    PainSc:  0-No pain 0-No pain   Body mass index is 25.4 kg/m.  Physical Exam Vitals reviewed.  HENT:     Head: Normocephalic.     Mouth/Throat:     Mouth: Mucous membranes are moist.  Eyes:     Extraocular Movements: Extraocular movements intact.     Conjunctiva/sclera: Conjunctivae normal.  Cardiovascular:     Rate and Rhythm: Normal rate and regular rhythm.     Heart sounds: Normal heart sounds. No murmur heard. Pulmonary:     Effort: Pulmonary effort is normal.     Breath sounds: Normal breath sounds. No wheezing.   Abdominal:     General: Bowel sounds are normal.     Palpations: Abdomen is soft.  Musculoskeletal:        General: Normal range of motion.  Skin:    General: Skin is warm.  Neurological:     Mental Status: He is alert and oriented to person, place, and time.  Psychiatric:        Mood and Affect: Mood normal.          03/10/2021    8:11 AM 01/30/2020    9:02 AM 07/05/2016   11:34 AM 06/29/2016    9:37 AM 05/16/2016   11:37 AM 05/14/2016    3:01 PM  Advanced Directives  Does Patient Have a Medical Advance Directive? Yes No No No No No  Type of Estate agent of [place];Living will       Would patient like information on creating a medical advance directive?  Yes (Inpatient - patient defers creating a medical advance directive at this time - Information given)  Yes - Educational materials given No - patient declined information No - patient declined information    Current Medications (verified) Outpatient Encounter Medications as of 07/05/2023  Medication Sig   albuterol (VENTOLIN HFA) 108 (90 Base) MCG/ACT inhaler TAKE 2 PUFFS BY MOUTH EVERY 6 HOURS AS NEEDED FOR WHEEZE OR SHORTNESS OF BREATH  allopurinol (ZYLOPRIM) 300 MG tablet TAKE 1 TABLET BY MOUTH EVERY DAY BEFORE BREAKFAST   Budeson-Glycopyrrol-Formoterol (BREZTRI AEROSPHERE) 160-9-4.8 MCG/ACT AERO Inhale 2 puffs into the lungs 2 (two) times daily.   celecoxib (CELEBREX) 100 MG capsule Take 200 mg by mouth.   etanercept (ENBREL) 50 MG/ML injection Inject 50 mg into the skin once a week.   folic acid (FOLVITE) 1 MG tablet Take by mouth.   lisinopril (ZESTRIL) 5 MG tablet TAKE 1 TABLET BY MOUTH TWICE A DAY   methotrexate (RHEUMATREX) 2.5 MG tablet Take 15 mg by mouth once a week.   predniSONE (DELTASONE) 5 MG tablet Take 1 tablet (5 mg total) by mouth daily as needed.   rosuvastatin (CRESTOR) 10 MG tablet TAKE 1 TABLET BY MOUTH EVERY DAY   sildenafil (REVATIO) 20 MG tablet 3-5 TABLETS ONCE DAILY ONE HOUR  PRIOR TO INTERCOUSE.   No facility-administered encounter medications on file as of 07/05/2023.    Allergies (verified) Patient has no known allergies.   History: Past Medical History:  Diagnosis Date   Acute cholangitis 05/14/2016   Arthritis    Rheumatoid arthritis   Choledocholithiasis 05/16/2016   DVT (deep venous thrombosis) (HCC) 02/10/2021       Gallstone pancreatitis 05/14/2016   Gout    Hearing loss of left ear due to cerumen impaction 02/09/2020   Hyperlipidemia    Hypertension    Pancreatitis 05/2016   [facility]osp. [facility]   Pneumonia 05/2016   Past Surgical History:  Procedure Laterality Date   CATARACT EXTRACTION Bilateral 2022   CHOLECYSTECTOMY     CHOLECYSTECTOMY N/A 07/04/2016   Procedure: LAPAROSCOPIC CHOLECYSTECTOMY WITH INTRAOPERATIVE CHOLANGIOGRAM;  Surgeon: Avel Peace, MD;  Location: [facility] OR;  Service: General;  Laterality: N/A;   ERCP N/A 05/16/2016   Procedure: ENDOSCOPIC RETROGRADE CHOLANGIOPANCREATOGRAPHY (ERCP);  Surgeon: Sherrilyn Rist, MD;  Location: [facility];  Service: Endoscopy;  Laterality: N/A;   KNEE SURGERY Left 1997   Family History  Problem Relation Age of Onset   Anxiety disorder Father    Social History   Socioeconomic History   Marital status: Married    Spouse name: Amil Amen   Number of children: Not on file   Years of education: Not on file   Highest education level: Not on file  Occupational History   Not on file  Tobacco Use   Smoking status: Every Day   Smokeless tobacco: Never   Tobacco comments:    CT Screening ordered 03/15/22  Vaping Use   Vaping status: Never Used  Substance and Sexual Activity   Alcohol use: Yes    Alcohol/week: 1.0 standard drink of alcohol    Types: 1 Standard drinks or equivalent per week    Comment: ocasionally   Drug use: Never   Sexual activity: Yes    Partners: Female  Other Topics Concern   Not on file  Social History Narrative   Not on file   Social Determinants of Health    Financial Resource Strain: Low Risk  (07/05/2023)   Overall Financial Resource Strain (CARDIA)    Difficulty of Paying Living Expenses: Not hard at all  Food Insecurity: No Food Insecurity (07/05/2023)   Hunger Vital Sign    Worried About Running Out of Food in the Last Year: Never true    Ran Out of Food in the Last Year: Never true  Transportation Needs: No Transportation Needs (01/31/2023)   PRAPARE - Administrator, Civil Service (Medical): No  Lack of Transportation (Non-Medical): No  Physical Activity: Sufficiently Active (07/05/2023)   Exercise Vital Sign    Days of Exercise per Week: 5 days    Minutes of Exercise per Session: 30 min  Stress: No Stress Concern Present (07/05/2023)   [employer] of Occupational Health - Occupational Stress Questionnaire    Feeling of Stress : Only a little  Social Connections: Socially Integrated (07/05/2023)   Social Connection and Isolation Panel [NHANES]    Frequency of Communication with Friends and Family: More than three times a week    Frequency of Social Gatherings with Friends and Family: Three times a week    Attends Religious Services: More than 4 times per year    Active Member of Clubs or Organizations: Yes    Attends Banker Meetings: More than 4 times per year    Marital Status: Married    Tobacco Counseling Ready to quit: Not Answered Counseling given: Not Answered Tobacco comments: CT Screening ordered 03/15/22  Clinical Intake:  Pre-visit preparation completed: Yes  Pain : No/denies pain Pain Score: 0-No pain     BMI - recorded: 25.4 Nutritional Status: BMI 25 -29 Overweight Nutritional Risks: None Diabetes: No  Activities of Daily Living: Independent Ambulation: Independent Medication Administration: Independent Home Management: Independent  Barriers to Care Management & Learning: None  Do you feel unsafe in your current relationship?: No Do you feel physically threatened by  others?: No Anyone hurting you at home, work, or school?: No Unable to ask?: Yes  How often do you need to have someone help you when you read instructions, pamphlets, or other written materials from your doctor or pharmacy?: 1 - Never What is the last grade level you completed in school?: Trade school 2 years and [employer] 2 years  Interpreter Needed?: No   Activities of Daily Living    07/05/2023   11:10 AM 12/08/2022   10:42 AM  In your present state of health, do you have any difficulty performing the following activities:  Hearing? 0 0  Vision? 1 0  Comment last eye exam 2  months ago- Dr. Precious Bard in [place]   Difficulty concentrating or making decisions? 0 0  Walking or climbing stairs? 0 0  Dressing or bathing? 0 0  Doing errands, shopping? 0 0    Patient Care Team: CoxFritzi Mandes, MD as PCP - General (Family Medicine) Patterson Hammersmith, MD as Consulting Physician (Rheumatology) Zettie Pho, [facility] (Inactive) (Pharmacist)  Indicate any recent Medical Services you may have received from other than [facility] providers in the past year (date may be approximate).     Assessment:   This is a routine wellness examination for Mina.  Hearing/Vision screen No results found.   Depression Screen    07/05/2023   11:06 AM 12/08/2022   10:42 AM 03/15/2022    9:12 AM 11/18/2021    8:04 AM 03/10/2021    8:12 AM  Depression screen PHQ 2/9  Decreased Interest 0 0 0 0 0  Down, Depressed, Hopeless 0 0 0 0 0  PHQ - 2 Score 0 0 0 0 0     Fall Risk     07/05/2023   11:16 AM 12/08/2022   10:42 AM 03/15/2022    9:14 AM 11/18/2021    8:03 AM 11/04/2021   11:13 AM  Fall Risk   Falls in the past year? 0 0 0 0 0  Number falls in past yr: 0 0 0 0 0  Injury  with Fall? 0 0 0 0 0  Risk for fall due to : No Fall Risks No Fall Risks No Fall Risks  No Fall Risks  Follow up Falls evaluation completed;Falls prevention discussed Falls evaluation completed Falls evaluation completed;Education provided  Falls evaluation completed Falls evaluation completed    MEDICARE RISK AT HOME: NONE    TIMED UP AND GO:  Was the test performed?  No    Cognitive Function:    03/15/2022    9:15 AM  MMSE - Mini Mental State Exam  Orientation to time 5  Orientation to Place 5  Registration 3  Attention/ Calculation 5  Recall 3  Language- name 2 objects 2  Language- repeat 1  Language- follow 3 step command 3  Language- read & follow direction 1  Write a sentence 1  Copy design 1  Total score 30        Immunizations Immunization History  Administered Date(s) Administered   Fluad Quad(high Dose 65+) 07/31/2020, 11/18/2021, 09/02/2022   Fluad Trivalent(High Dose 65+) 07/05/2023   Influenza-Unspecified 06/11/2019   PFIZER(Purple Top)SARS-COV-2 Vaccination 05/16/2020, 06/13/2020, 12/17/2020   Pfizer Covid-19 Vaccine Bivalent Booster 41yrs & up 11/18/2021   Pneumococcal Conjugate-13 09/22/2015   Pneumococcal Polysaccharide-23 09/17/2013, 03/10/2021   Td 01/02/2019   Tdap 10/25/2011    Screening Tests Health Maintenance  Topic Date Due   Zoster Vaccines- Shingrix (1 of 2) Never done   Lung Cancer Screening  06/25/2023   Medicare Annual Wellness (AWV)  07/04/2024   DTaP/Tdap/Td (3 - Td or Tdap) 01/01/2029   Colonoscopy  02/18/2032   Pneumonia Vaccine 75+ Years old  Completed   INFLUENZA VACCINE  Completed   HPV VACCINES  Aged Out   COVID-19 Vaccine  Discontinued   Hepatitis C Screening  Discontinued    Health Maintenance  Health Maintenance Due  Topic Date Due   Zoster Vaccines- Shingrix (1 of 2) Never done   Lung Cancer Screening  06/25/2023    Lung Cancer Screening: (Low Dose CT Chest recommended if Age 31-80 years, 20 pack-year currently smoking OR have quit w/in 15years.) does qualify.   Lung Cancer Screening Referral: I ordered the lung CT   Additional Screening:  Hepatitis C Screening: does not qualify  Vision Screening: Recommended annual ophthalmology exams  for early detection of glaucoma and other disorders of the eye. Is the patient up to date with their annual eye exam?  No  Who is the provider or what is the name of the office in which the patient attends annual eye exams? Unknown If pt is not established with a provider, would they like to be referred to a provider to establish care? No .   Dental Screening: Recommended annual dental exams for proper oral hygiene  Community Resource Referral / Chronic Care Management: CRR required this visit?  No   CCM required this visit?  No     Plan:    Dreshaun was seen today for annual exam.  Encounter for Medicare annual wellness exam  Erectile dysfunction, unspecified erectile dysfunction type Assessment & Plan: Labs drawn today Await labs/testing for assessment and recommendations.   Orders: -     Testosterone -     T4, free  Cigarette nicotine dependence with other nicotine-induced disorder Assessment & Plan: Encouraged to stop smoking.  Patient is going to start working out at [employer] and hopes he can try to quit. Lung CT ordered   Orders: -     CT CHEST LUNG CANCER SCREENING  LOW DOSE WO CONTRAST  Encounter for immunization -     Flu Vaccine Trivalent High Dose (Fluad)    I have personally reviewed and noted the following in the patient's chart:   Medical and social history Use of alcohol, tobacco or illicit drugs  Current medications and supplements including opioid prescriptions. Patient is not currently taking opioid prescriptions. Functional ability and status Nutritional status Physical activity Advanced directives List of other physicians Hospitalizations, surgeries, and ER visits in previous 12 months Vitals Screenings to include cognitive, depression, and falls Referrals and appointments  In addition, I have reviewed and discussed with patient certain preventive protocols, quality metrics, and best practice recommendations. A written personalized care plan  for preventive services as well as general preventive health recommendations were provided to patient.   Total time spent on today's visit was greater than 30 minutes, including both face-to-face time and nonface-to-face time personally spent on review of chart (labs and imaging), discussing labs and goals, discussing further work-up, treatment options, referrals to specialist if needed, reviewing outside records of pertinent, answering patient's questions, and coordinating care.   Lajuana Matte, FNP [facility] [phone]

## 2023-07-06 LAB — T4, FREE: Free T4: 1.76 ng/dL (ref 0.82–1.77)

## 2023-07-06 LAB — TESTOSTERONE: Testosterone: 633 ng/dL (ref 264–916)

## 2023-07-10 DIAGNOSIS — F17210 Nicotine dependence, cigarettes, uncomplicated: Secondary | ICD-10-CM | POA: Diagnosis not present

## 2023-07-10 DIAGNOSIS — Z122 Encounter for screening for malignant neoplasm of respiratory organs: Secondary | ICD-10-CM | POA: Diagnosis not present

## 2023-07-10 DIAGNOSIS — Z87891 Personal history of nicotine dependence: Secondary | ICD-10-CM | POA: Diagnosis not present

## 2023-07-14 ENCOUNTER — Other Ambulatory Visit: Payer: Self-pay | Admitting: Family Medicine

## 2023-07-24 ENCOUNTER — Other Ambulatory Visit: Payer: Self-pay

## 2023-07-24 DIAGNOSIS — J418 Mixed simple and mucopurulent chronic bronchitis: Secondary | ICD-10-CM

## 2023-07-24 DIAGNOSIS — J441 Chronic obstructive pulmonary disease with (acute) exacerbation: Secondary | ICD-10-CM

## 2023-07-24 DIAGNOSIS — J849 Interstitial pulmonary disease, unspecified: Secondary | ICD-10-CM

## 2023-07-24 MED ORDER — BREZTRI AEROSPHERE 160-9-4.8 MCG/ACT IN AERO
2.0000 | INHALATION_SPRAY | Freq: Two times a day (BID) | RESPIRATORY_TRACT | 5 refills | Status: DC
Start: 2023-07-24 — End: 2023-07-31

## 2023-07-31 ENCOUNTER — Other Ambulatory Visit: Payer: Self-pay

## 2023-07-31 DIAGNOSIS — J418 Mixed simple and mucopurulent chronic bronchitis: Secondary | ICD-10-CM

## 2023-07-31 MED ORDER — BREZTRI AEROSPHERE 160-9-4.8 MCG/ACT IN AERO: 2.0000 | INHALATION_SPRAY | Freq: Two times a day (BID) | RESPIRATORY_TRACT | 5 refills | Status: AC

## 2023-08-08 DIAGNOSIS — H26493 Other secondary cataract, bilateral: Secondary | ICD-10-CM | POA: Diagnosis not present

## 2023-08-08 DIAGNOSIS — H35373 Puckering of macula, bilateral: Secondary | ICD-10-CM | POA: Diagnosis not present

## 2023-08-08 DIAGNOSIS — H353131 Nonexudative age-related macular degeneration, bilateral, early dry stage: Secondary | ICD-10-CM | POA: Diagnosis not present

## 2023-08-08 DIAGNOSIS — H26492 Other secondary cataract, left eye: Secondary | ICD-10-CM | POA: Diagnosis not present

## 2023-08-08 DIAGNOSIS — H18413 Arcus senilis, bilateral: Secondary | ICD-10-CM | POA: Diagnosis not present

## 2023-08-08 DIAGNOSIS — Z961 Presence of intraocular lens: Secondary | ICD-10-CM | POA: Diagnosis not present

## 2023-09-19 ENCOUNTER — Institutional Professional Consult (permissible substitution): Payer: PPO | Admitting: Internal Medicine

## 2023-09-21 DIAGNOSIS — M1A09X0 Idiopathic chronic gout, multiple sites, without tophus (tophi): Secondary | ICD-10-CM | POA: Diagnosis not present

## 2023-09-21 DIAGNOSIS — M0579 Rheumatoid arthritis with rheumatoid factor of multiple sites without organ or systems involvement: Secondary | ICD-10-CM | POA: Diagnosis not present

## 2023-09-21 DIAGNOSIS — Z79899 Other long term (current) drug therapy: Secondary | ICD-10-CM | POA: Diagnosis not present

## 2023-09-24 ENCOUNTER — Other Ambulatory Visit: Payer: Self-pay | Admitting: Family Medicine

## 2023-10-11 ENCOUNTER — Other Ambulatory Visit: Payer: Self-pay | Admitting: Family Medicine

## 2023-11-09 ENCOUNTER — Ambulatory Visit: Payer: PPO | Admitting: Internal Medicine

## 2023-11-09 ENCOUNTER — Encounter: Payer: Self-pay | Admitting: Internal Medicine

## 2023-11-09 VITALS — BP 137/87 | HR 84 | Ht 67.0 in | Wt 172.6 lb

## 2023-11-09 DIAGNOSIS — Z8739 Personal history of other diseases of the musculoskeletal system and connective tissue: Secondary | ICD-10-CM

## 2023-11-09 DIAGNOSIS — R918 Other nonspecific abnormal finding of lung field: Secondary | ICD-10-CM | POA: Diagnosis not present

## 2023-11-09 DIAGNOSIS — R0982 Postnasal drip: Secondary | ICD-10-CM

## 2023-11-09 DIAGNOSIS — J329 Chronic sinusitis, unspecified: Secondary | ICD-10-CM | POA: Diagnosis not present

## 2023-11-09 DIAGNOSIS — J439 Emphysema, unspecified: Secondary | ICD-10-CM

## 2023-11-09 DIAGNOSIS — R0609 Other forms of dyspnea: Secondary | ICD-10-CM | POA: Diagnosis not present

## 2023-11-09 DIAGNOSIS — D849 Immunodeficiency, unspecified: Secondary | ICD-10-CM | POA: Diagnosis not present

## 2023-11-09 NOTE — Progress Notes (Addendum)
OV 11/09/2023  Subjective:  Patient ID: Gerald Odom, male , DOB: 1950-11-01 , age 73 y.o. , MRN: [id number] , ADDRESS: [address] [place] [address] PCP Blane Ohara, MD Patient Care Team: Blane Ohara, MD as PCP - General (Family Medicine) Patterson Hammersmith, MD as Consulting Physician (Rheumatology) Zettie Pho, [facility] (Inactive) (Pharmacist)  This Provider for this visit: Treatment Team:  Attending Provider: Kalman Shan, MD    11/09/2023 -   Chief Complaint  Patient presents with   Consult    Ct 10/7, lung nodule and excessive chest congestion      HPI ITAMAR Odom 73 y.o. -has been referred here because of concerns of early interstitial lung disease discovered on fall 2024 CT scan of the chest done for lung cancer screening.  History is obtained from him and also review of the medical records.  He is retired.  He is a ongoing smoker.  He does have a diagnosis of emphysema for which she is on breast tree.  He also has a 9-year diagnosis of rheumatoid arthritis or 10 years.  He sees Dr. Allena Katz at [facility].  He is on both Enbrel for 8 years and methotrexate for several years.  He gives his Enbrel injections himself.  Without this he says his pain will be significant.  He says he feels fine except that his nose gets very stuffy early in the morning and last 1 hour rest of the day he is largely fine but occasionally it will stuff up.  He says he gets dyspneic doing yard work but he thinks is because of stuffiness of the nose.  But he does admit the shortness of breath has been going on for 1 year and might be getting worse.  However he is more concerned about his sinuses.  He had a CT scan of the chest low-dose lung cancer protocol.  Emphysema is seen.  Dr. Fredirick Lathe thought he had some baseline groundglass opacities suggestive of early interstitial lung abnormality/early ILD and therefore has been referred here.  He himself is not aware of this other than the  something in his lung   He did do the ILD questionnaire and it is below.     [facility] Comprehensive ILD Questionnaire  Symptoms:   SYMPTOM SCALE - ILD 11/09/2023  Current weight   O2 use ra  Shortness of Breath 0 -> 5 scale with 5 being worst (score 6 If unable to do)  At rest 0  Simple tasks - showers, clothes change, eating, shaving 1  Household (dishes, doing bed, laundry) 1  Shopping 0  Walking level at own pace 0  Walking up Stairs 1  Total (30-36) Dyspnea Score 3      Non-dyspnea symptoms (0-> 5 scale) 11/09/2023  How bad is your cough? 1  How bad is your fatigue 1  How bad is nausea 0  How bad is vomiting?  0  How bad is diarrhea? 0  How bad is anxiety? 0  How bad is depression 0  Any chronic pain - if so where and how bad 0     Past Medical History :  Emphysema/COPD given in 2017 Rheumatoid arthritis for 10 years on immunosuppressive medications He had a history of pneumonia not otherwise specified 7 years ago History of blood clots not otherwise specified 3 years ago He has had COVID disease and the vaccine.  He was hospitalized in 2021 January.  Details not known.  ROS:  Positive for fatigue and arthralgia but no dysphagia.  He also has snoring  FAMILY HISTORY of LUNG DISEASE:  Negative for any pulmonary diseases in the family  PERSONAL EXPOSURE HISTORY:  Ongoing smoking since 17 7420 cigarettes/day which is 1 pack.Marland Kitchen  He might smoke marijuana intermittently 1/year.  This been going on since 1967.  HOME  EXPOSURE and HOBBY DETAILS :  Lives in a single-family home.  The home is [address] years old.  He has been living there for 19 years.  Detail organic antigen exposure history is negative.  OCCUPATIONAL HISTORY (122 questions) : He is retired after working at [employer] lot.  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  Otherwise negative  PULMONARY TOXICITY HISTORY (27 items):   He has been on methotrexate for 8 years on Enbrel for 8 years.  Was on prednisone briefly 8 years ago.  He is also on allopurinol [can cause pulmonary eosinophilia.  Other medicines include lisinopril [can cause cough] aspirin folic acid  INVESTIGATIONS: Only low-dose CT scan of the chest in fall 2024.  There is no high-resolution CT chest and I cannot even visualize the low-dose CT scan of the chest.  Simple office walk 224 (66+46 x 2) feet Pod A at [facility] x  3 laps goal with forehead probe 11/09/2023    O2 used ra   Number laps completed Sit stand   Comments about pace fast   Resting Pulse Ox/HR 96% and 89/min   Final Pulse Ox/HR 89% and 93/min   Desaturated </= 88% No but almost   Desaturated <= 3% points yes   Got Tachycardic >/= 90/min yes   Symptoms at end of test No dyspnea   Miscellaneous comments x      PFT      No data to display            LAB RESULTS last 96 hours No results found.  LAB RESULTS last 90 days No results found for this or any previous visit (from the past 2160 hours).       has a past medical history of Acute cholangitis (05/14/2016), Arthritis, Choledocholithiasis (05/16/2016), DVT (deep venous thrombosis) (HCC) (02/10/2021), Gallstone pancreatitis (05/14/2016), Gout, Hearing loss of left ear due to cerumen impaction (02/09/2020), Hyperlipidemia, Hypertension, Pancreatitis (05/2016), and Pneumonia (05/2016).   reports that he has been smoking. He has never used smokeless tobacco.  Past Surgical History:  Procedure Laterality Date   CATARACT EXTRACTION Bilateral 2022   CHOLECYSTECTOMY     CHOLECYSTECTOMY N/A 07/04/2016   Procedure: LAPAROSCOPIC CHOLECYSTECTOMY WITH INTRAOPERATIVE CHOLANGIOGRAM;  Surgeon: Avel Peace, MD;  Location: [facility] OR;  Service: General;  Laterality: N/A;   ERCP N/A 05/16/2016   Procedure: ENDOSCOPIC RETROGRADE CHOLANGIOPANCREATOGRAPHY (ERCP);  Surgeon: Sherrilyn Rist, MD;  Location: [facility];  Service:  Endoscopy;  Laterality: N/A;   KNEE SURGERY Left 1997    No Known Allergies  Immunization History  Administered Date(s) Administered   Fluad Quad(high Dose 65+) 07/31/2020, 11/18/2021, 09/02/2022   Fluad Trivalent(High Dose 65+) 07/05/2023   Influenza-Unspecified 06/11/2019   PFIZER(Purple Top)SARS-COV-2 Vaccination 05/16/2020, 06/13/2020, 12/17/2020   Pfizer Covid-19 Vaccine Bivalent Booster 41yrs & up 11/18/2021   Pneumococcal Conjugate-13 09/22/2015   Pneumococcal Polysaccharide-23 09/17/2013, 03/10/2021   Td 01/02/2019   Tdap 10/25/2011    Family History  Problem Relation Age of Onset   Anxiety disorder Father      Current Outpatient Medications:    albuterol (VENTOLIN  HFA) 108 (90 Base) MCG/ACT inhaler, TAKE 2 PUFFS BY MOUTH EVERY 6 HOURS AS NEEDED FOR WHEEZE OR SHORTNESS OF BREATH, Disp: 18 each, Rfl: 2   allopurinol (ZYLOPRIM) 300 MG tablet, TAKE 1 TABLET BY MOUTH EVERY DAY BEFORE BREAKFAST, Disp: 90 tablet, Rfl: 1   Budeson-Glycopyrrol-Formoterol (BREZTRI AEROSPHERE) 160-9-4.8 MCG/ACT AERO, Inhale 2 puffs into the lungs 2 (two) times daily., Disp: 10.7 g, Rfl: 5   celecoxib (CELEBREX) 100 MG capsule, Take 200 mg by mouth., Disp: , Rfl:    etanercept (ENBREL) 50 MG/ML injection, Inject 50 mg into the skin once a week., Disp: , Rfl:    folic acid (FOLVITE) 1 MG tablet, Take by mouth., Disp: , Rfl:    lisinopril (ZESTRIL) 5 MG tablet, TAKE 1 TABLET BY MOUTH TWICE A DAY, Disp: 180 tablet, Rfl: 0   methotrexate (RHEUMATREX) 2.5 MG tablet, Take 15 mg by mouth once a week., Disp: , Rfl:    predniSONE (DELTASONE) 5 MG tablet, Take 1 tablet (5 mg total) by mouth daily as needed., Disp: 30 tablet, Rfl: 0   rosuvastatin (CRESTOR) 10 MG tablet, TAKE 1 TABLET BY MOUTH EVERY DAY, Disp: 90 tablet, Rfl: 1   sildenafil (REVATIO) 20 MG tablet, 3-5 TABLETS ONCE DAILY ONE HOUR PRIOR TO INTERCOUSE., Disp: 50 tablet, Rfl: 0      Objective:   Vitals:   11/09/23 1507  BP: 137/87  Pulse:  84  SpO2: 97%  Weight: 172 lb 9.6 oz (78.3 kg)  Height: 5\' 7"  (1.702 m)    Estimated body mass index is 27.03 kg/m as calculated from the following:   Height as of this encounter: 5\' 7"  (1.702 m).   Weight as of this encounter: 172 lb 9.6 oz (78.3 kg).  @WEIGHTCHANGE @  [employer]   11/09/23 1507  Weight: 172 lb 9.6 oz (78.3 kg)     Physical Exam   General: No distress. Looks wel O2 at rest: no Cane present: no Sitting in wheel chair: no Frail: no Obese: no Neuro: Alert and Oriented x 3. GCS 15. Speech normal Psych: Pleasant Resp:  Barrel Chest - no.  Wheeze - no, Crackles - no, No overt respiratory distress CVS: Normal heart sounds. Murmurs - no Ext: Stigmata of Connective Tissue Disease - no (HAS RA BUT ON EXAM ONLY CLUBBING HEENT: Normal upper airway. PEERL +. No post nasal drip        Assessment:       ICD-10-CM   1. Chronic sinusitis, unspecified location  J32.9 CT MAXILLOFACIAL WO CONTRAST    CBC w/Diff    Perennial allergen profile IgE    Alpha-1 antitrypsin phenotype    2. Post-nasal drip  R09.82 CT MAXILLOFACIAL WO CONTRAST    CBC w/Diff    Perennial allergen profile IgE    Alpha-1 antitrypsin phenotype    3. DOE (dyspnea on exertion)  R06.09 CT MAXILLOFACIAL WO CONTRAST    CBC w/Diff    Perennial allergen profile IgE    Alpha-1 antitrypsin phenotype    4. Pulmonary emphysema, unspecified emphysema type (HCC)  J43.9 CT MAXILLOFACIAL WO CONTRAST    CBC w/Diff    Perennial allergen profile IgE    Alpha-1 antitrypsin phenotype    5. Ground glass opacity present on imaging of lung  R91.8 CT MAXILLOFACIAL WO CONTRAST    CBC w/Diff    Perennial allergen profile IgE    Alpha-1 antitrypsin phenotype    6. History of rheumatoid arthritis  Z87.39 CT MAXILLOFACIAL WO CONTRAST  CBC w/Diff    Perennial allergen profile IgE    Alpha-1 antitrypsin phenotype    7. Immunosuppressed status (HCC)  D84.9 CT MAXILLOFACIAL WO CONTRAST    CBC w/Diff     Perennial allergen profile IgE    Alpha-1 antitrypsin phenotype         Plan:     Patient Instructions  Chronic sinusitis, unspecified location Post-nasal drip  Plan  - get SInus CT next fdw weeks - get RAST allergy panel and cbc wit diff 11/09/2023  DOE (dyspnea on exertion) Pulmonary emphysema, unspecified emphysema type (HCC)  Plan'  - check alpha 1  -g et full PFT  Ground glass opacity present on imaging of lung History of rheumatoid arthritis on immunosuppressive therapy CLUBBING  Plan -Get full PFT first available  -Do ILD questionnaire  - based on resutls time HRCT but aim for OCt 2025 which is 1 year  Followup; 2 months with APP but after above   FOLLOWUP Return in about 2 months (around 01/07/2024) for 30 min visit, with any of the APPS.    SIGNATURE    Dr. Kalman Shan, M.D., F.C.C.P,  Pulmonary and Critical Care Medicine Staff Physician, [facility] Center Director - Interstitial Lung Disease  Program  Pulmonary Fibrosis [facility] at [facility] [place], [place], [address]  Pager: [phone], If no answer or between  15:00h - 7:00h: call [phone] Telephone: [phone]  4:00 PM 11/09/2023

## 2023-11-09 NOTE — Patient Instructions (Addendum)
Chronic sinusitis, unspecified location Post-nasal drip  Plan  - get SInus CT next fdw weeks - get RAST allergy panel and cbc wit diff 11/09/2023  DOE (dyspnea on exertion) Pulmonary emphysema, unspecified emphysema type (HCC)  Plan'  - check alpha 1  -g et full PFT  Ground glass opacity present on imaging of lung History of rheumatoid arthritis on immunosuppressive therapy CLUBBING  Plan -Get full PFT first available  -Do ILD questionnaire  - based on resutls time HRCT but aim for OCt 2025 which is 1 year  Followup; 2 months with APP but after above

## 2023-11-11 LAB — ALLERGEN PROFILE, PERENNIAL ALLERGEN IGE

## 2023-11-15 LAB — ALPHA-1 ANTITRYPSIN PHENOTYPE: A-1 Antitrypsin, Ser: 166 mg/dL (ref 83–199)

## 2023-11-22 DIAGNOSIS — M069 Rheumatoid arthritis, unspecified: Secondary | ICD-10-CM | POA: Diagnosis not present

## 2023-11-22 DIAGNOSIS — M1A09X0 Idiopathic chronic gout, multiple sites, without tophus (tophi): Secondary | ICD-10-CM | POA: Diagnosis not present

## 2023-11-22 DIAGNOSIS — M0579 Rheumatoid arthritis with rheumatoid factor of multiple sites without organ or systems involvement: Secondary | ICD-10-CM | POA: Diagnosis not present

## 2023-11-22 DIAGNOSIS — Z7960 Long term (current) use of unspecified immunomodulators and immunosuppressants: Secondary | ICD-10-CM | POA: Diagnosis not present

## 2023-12-07 ENCOUNTER — Other Ambulatory Visit: Payer: PPO

## 2023-12-12 NOTE — Progress Notes (Unsigned)
 Subjective:  Patient ID: Gerald Odom, male    DOB: 1950-12-16  Age: 73 y.o. MRN: [id number]  Chief Complaint  Patient presents with   Medical Management of Chronic Issues    HPI   Hyperlipidemia/Aortic atherosclerosis: He is taking Rosuvastatin 10 mg daily. Eating healthy and exercises.   Hypertension: Taking Lisinopril 5 mg daily.   RA:  He takes Methotrexate 2.5 mg 15 mg weekly, Enbrel 50 mg injection weekly, celebrex 200 mg daily as needed. Well controlled.  Had a little flare up on Saturday. Took prednisone 5 mg daily x 2 day.    Gout: Allopurinol 300 mg daily. No flare ups in a long time.    Tobacco Use: Currently smoking. Restarted 1/2 - 1 ppd.    COPD: on Breztri 2 puffs twice daily.     07/05/2023   11:06 AM 12/08/2022   10:42 AM 03/15/2022    9:12 AM 11/18/2021    8:04 AM 03/10/2021    8:12 AM  Depression screen PHQ 2/9  Decreased Interest 0 0 0 0 0  Down, Depressed, Hopeless 0 0 0 0 0  PHQ - 2 Score 0 0 0 0 0        07/05/2023   11:16 AM  Fall Risk   Falls in the past year? 0  Number falls in past yr: 0  Injury with Fall? 0  Risk for fall due to : No Fall Risks  Follow up Falls evaluation completed;Falls prevention discussed    Patient Care Team: Cox, Fritzi Mandes, MD as PCP - General (Family Medicine) Patterson Hammersmith, MD as Consulting Physician (Rheumatology) Zettie Pho, [facility] (Inactive) (Pharmacist)   Review of Systems  Current Outpatient Medications on File Prior to Visit  Medication Sig Dispense Refill   albuterol (VENTOLIN HFA) 108 (90 Base) MCG/ACT inhaler TAKE 2 PUFFS BY MOUTH EVERY 6 HOURS AS NEEDED FOR WHEEZE OR SHORTNESS OF BREATH 18 each 2   allopurinol (ZYLOPRIM) 300 MG tablet TAKE 1 TABLET BY MOUTH EVERY DAY BEFORE BREAKFAST 90 tablet 1   Budeson-Glycopyrrol-Formoterol (BREZTRI AEROSPHERE) 160-9-4.8 MCG/ACT AERO Inhale 2 puffs into the lungs 2 (two) times daily. 10.7 g 5   celecoxib (CELEBREX) 100 MG capsule Take 200 mg by mouth.      etanercept (ENBREL) 50 MG/ML injection Inject 50 mg into the skin once a week.     folic acid (FOLVITE) 1 MG tablet Take by mouth.     lisinopril (ZESTRIL) 5 MG tablet TAKE 1 TABLET BY MOUTH TWICE A DAY 180 tablet 0   methotrexate (RHEUMATREX) 2.5 MG tablet Take 15 mg by mouth once a week.     predniSONE (DELTASONE) 5 MG tablet Take 1 tablet (5 mg total) by mouth daily as needed. 30 tablet 0   rosuvastatin (CRESTOR) 10 MG tablet TAKE 1 TABLET BY MOUTH EVERY DAY 90 tablet 1   sildenafil (REVATIO) 20 MG tablet 3-5 TABLETS ONCE DAILY ONE HOUR PRIOR TO INTERCOUSE. 50 tablet 0   No current facility-administered medications on file prior to visit.   Past Medical History:  Diagnosis Date   Acute cholangitis 05/14/2016   Arthritis    Rheumatoid arthritis   Choledocholithiasis 05/16/2016   DVT (deep venous thrombosis) (HCC) 02/10/2021       Gallstone pancreatitis 05/14/2016   Gout    Hearing loss of left ear due to cerumen impaction 02/09/2020   Hyperlipidemia    Hypertension    Pancreatitis 05/2016   hosp. [facility]   Pneumonia 05/2016  Past Surgical History:  Procedure Laterality Date   CATARACT EXTRACTION Bilateral 2022   CHOLECYSTECTOMY     CHOLECYSTECTOMY N/A 07/04/2016   Procedure: LAPAROSCOPIC CHOLECYSTECTOMY WITH INTRAOPERATIVE CHOLANGIOGRAM;  Surgeon: Avel Peace, MD;  Location: [facility] OR;  Service: General;  Laterality: N/A;   ERCP N/A 05/16/2016   Procedure: ENDOSCOPIC RETROGRADE CHOLANGIOPANCREATOGRAPHY (ERCP);  Surgeon: Sherrilyn Rist, MD;  Location: [facility];  Service: Endoscopy;  Laterality: N/A;   KNEE SURGERY Left 1997    Family History  Problem Relation Age of Onset   Anxiety disorder Father    Social History   Socioeconomic History   Marital status: Married    Spouse name: Amil Amen   Number of children: Not on file   Years of education: Not on file   Highest education level: Not on file  Occupational History   Not on file  Tobacco Use   Smoking status: Every  Day   Smokeless tobacco: Never   Tobacco comments:    CT Screening ordered 03/15/22  Vaping Use   Vaping status: Never Used  Substance and Sexual Activity   Alcohol use: Yes    Alcohol/week: 1.0 standard drink of alcohol    Types: 1 Standard drinks or equivalent per week    Comment: ocasionally   Drug use: Never   Sexual activity: Yes    Partners: Female  Other Topics Concern   Not on file  Social History Narrative   Not on file   Social Drivers of Health   Financial Resource Strain: Low Risk  (07/05/2023)   Overall Financial Resource Strain (CARDIA)    Difficulty of Paying Living Expenses: Not hard at all  Food Insecurity: No Food Insecurity (07/05/2023)   Hunger Vital Sign    Worried About Running Out of Food in the Last Year: Never true    Ran Out of Food in the Last Year: Never true  Transportation Needs: No Transportation Needs (01/31/2023)   PRAPARE - Administrator, Civil Service (Medical): No    Lack of Transportation (Non-Medical): No  Physical Activity: Sufficiently Active (07/05/2023)   Exercise Vital Sign    Days of Exercise per Week: 5 days    Minutes of Exercise per Session: 30 min  Stress: No Stress Concern Present (07/05/2023)   [employer] of Occupational Health - Occupational Stress Questionnaire    Feeling of Stress : Only a little  Social Connections: Socially Integrated (07/05/2023)   Social Connection and Isolation Panel [NHANES]    Frequency of Communication with Friends and Family: More than three times a week    Frequency of Social Gatherings with Friends and Family: Three times a week    Attends Religious Services: More than 4 times per year    Active Member of Clubs or Organizations: Yes    Attends Banker Meetings: More than 4 times per year    Marital Status: Married    Objective:  There were no vitals taken for this visit.     11/09/2023    3:07 PM 07/05/2023   10:56 AM 06/12/2023   10:33 AM  BP/Weight   Systolic BP 137 124 130  Diastolic BP 87 72 80  Wt. (Lbs) 172.6 164.6 166  BMI 27.03 kg/m2 25.4 kg/m2 26 kg/m2    Physical Exam  Diabetic Foot Exam - Simple   No data filed      Lab Results  Component Value Date   WBC 10.7 06/12/2023   HGB 16.3 06/12/2023  HCT 47.9 06/12/2023   PLT 191 06/12/2023   GLUCOSE 98 06/12/2023   CHOL 124 12/05/2022   TRIG 101 12/05/2022   HDL 51 12/05/2022   LDLCALC 54 12/05/2022   ALT 16 06/12/2023   AST 19 06/12/2023   NA 138 06/12/2023   K 4.7 06/12/2023   CL 104 06/12/2023   CREATININE 0.91 06/12/2023   BUN 10 06/12/2023   CO2 21 06/12/2023   TSH 1.600 06/12/2023   INR 0.97 05/14/2016      Assessment & Plan:    Essential hypertension, benign  Rheumatoid arthritis involving multiple sites with positive rheumatoid factor (HCC)  Cigarette nicotine dependence without complication     No orders of the defined types were placed in this encounter.   No orders of the defined types were placed in this encounter.    Follow-up: No follow-ups on file.   I,Caileb Rhue I Leal-Borjas,acting as a scribe for Blane Ohara, MD.,have documented all relevant documentation on the behalf of Blane Ohara, MD,as directed by  Blane Ohara, MD while in the presence of Blane Ohara, MD.   An After Visit Summary was printed and given to the patient.  Blane Ohara, MD [facility] [phone]

## 2023-12-13 ENCOUNTER — Encounter: Payer: PPO | Admitting: Family Medicine

## 2023-12-13 DIAGNOSIS — I10 Essential (primary) hypertension: Secondary | ICD-10-CM

## 2023-12-13 DIAGNOSIS — M0579 Rheumatoid arthritis with rheumatoid factor of multiple sites without organ or systems involvement: Secondary | ICD-10-CM

## 2023-12-13 DIAGNOSIS — F17210 Nicotine dependence, cigarettes, uncomplicated: Secondary | ICD-10-CM

## 2023-12-13 DIAGNOSIS — I700 Atherosclerosis of aorta: Secondary | ICD-10-CM

## 2023-12-16 NOTE — Progress Notes (Unsigned)
 This encounter was created in error - please disregard.

## 2024-01-02 ENCOUNTER — Ambulatory Visit
Admission: RE | Admit: 2024-01-02 | Discharge: 2024-01-02 | Disposition: A | Discharge status: Home / Self Care | Payer: PPO | Source: Ambulatory Visit | Attending: Internal Medicine | Admitting: Internal Medicine

## 2024-01-02 DIAGNOSIS — J329 Chronic sinusitis, unspecified: Secondary | ICD-10-CM

## 2024-01-02 DIAGNOSIS — J439 Emphysema, unspecified: Secondary | ICD-10-CM

## 2024-01-02 DIAGNOSIS — Z8739 Personal history of other diseases of the musculoskeletal system and connective tissue: Secondary | ICD-10-CM

## 2024-01-02 DIAGNOSIS — J320 Chronic maxillary sinusitis: Secondary | ICD-10-CM | POA: Diagnosis not present

## 2024-01-02 DIAGNOSIS — R0982 Postnasal drip: Secondary | ICD-10-CM

## 2024-01-02 DIAGNOSIS — R0609 Other forms of dyspnea: Secondary | ICD-10-CM

## 2024-01-02 DIAGNOSIS — R918 Other nonspecific abnormal finding of lung field: Secondary | ICD-10-CM

## 2024-01-02 DIAGNOSIS — D849 Immunodeficiency, unspecified: Secondary | ICD-10-CM

## 2024-01-04 ENCOUNTER — Other Ambulatory Visit (HOSPITAL_BASED_OUTPATIENT_CLINIC_OR_DEPARTMENT_OTHER): Payer: Self-pay

## 2024-01-04 DIAGNOSIS — J439 Emphysema, unspecified: Secondary | ICD-10-CM

## 2024-01-07 ENCOUNTER — Other Ambulatory Visit: Payer: Self-pay | Admitting: Family Medicine

## 2024-01-08 ENCOUNTER — Ambulatory Visit (HOSPITAL_BASED_OUTPATIENT_CLINIC_OR_DEPARTMENT_OTHER): Payer: PPO | Admitting: Internal Medicine

## 2024-01-08 DIAGNOSIS — J439 Emphysema, unspecified: Secondary | ICD-10-CM

## 2024-01-08 LAB — PULMONARY FUNCTION TEST
DL/VA % pred: 69 %
DL/VA: 2.78 ml/min/mmHg/L
DLCO cor % pred: 55 %
DLCO cor: 12.61 ml/min/mmHg
DLCO unc % pred: 55 %
DLCO unc: 12.61 ml/min/mmHg
FEF 25-75 Post: 0.93 L/s
FEF 25-75 Pre: 0.82 L/s
FEF2575-%Change-Post: 14 %
FEF2575-%Pred-Post: 47 %
FEF2575-%Pred-Pre: 41 %
FEV1-%Change-Post: 3 %
FEV1-%Pred-Post: 69 %
FEV1-%Pred-Pre: 67 %
FEV1-Post: 1.88 L
FEV1-Pre: 1.81 L
FEV1FVC-%Change-Post: 0 %
FEV1FVC-%Pred-Pre: 80 %
FEV6-%Change-Post: 3 %
FEV6-%Pred-Post: 90 %
FEV6-%Pred-Pre: 87 %
FEV6-Post: 3.17 L
FEV6-Pre: 3.05 L
FEV6FVC-%Change-Post: 0 %
FEV6FVC-%Pred-Post: 104 %
FEV6FVC-%Pred-Pre: 104 %
FVC-%Change-Post: 4 %
FVC-%Pred-Post: 87 %
FVC-%Pred-Pre: 83 %
FVC-Post: 3.26 L
FVC-Pre: 3.12 L
Post FEV1/FVC ratio: 58 %
Post FEV6/FVC ratio: 97 %
Pre FEV1/FVC ratio: 58 %
Pre FEV6/FVC Ratio: 98 %
RV % pred: 112 %
RV: 2.68 L
TLC % pred: 94 %
TLC: 6.1 L

## 2024-01-08 NOTE — Progress Notes (Signed)
 Full PFT Performed Today

## 2024-01-08 NOTE — Patient Instructions (Signed)
 Full PFT Performed Today

## 2024-01-09 ENCOUNTER — Ambulatory Visit: Payer: PPO | Admitting: Nurse Practitioner

## 2024-01-09 ENCOUNTER — Encounter: Payer: Self-pay | Admitting: Nurse Practitioner

## 2024-01-09 VITALS — BP 140/94 | HR 71 | Ht 67.0 in | Wt 172.6 lb

## 2024-01-09 DIAGNOSIS — M069 Rheumatoid arthritis, unspecified: Secondary | ICD-10-CM

## 2024-01-09 DIAGNOSIS — J449 Chronic obstructive pulmonary disease, unspecified: Secondary | ICD-10-CM | POA: Diagnosis not present

## 2024-01-09 DIAGNOSIS — R918 Other nonspecific abnormal finding of lung field: Secondary | ICD-10-CM

## 2024-01-09 DIAGNOSIS — R0609 Other forms of dyspnea: Secondary | ICD-10-CM

## 2024-01-09 DIAGNOSIS — F17210 Nicotine dependence, cigarettes, uncomplicated: Secondary | ICD-10-CM | POA: Diagnosis not present

## 2024-01-09 DIAGNOSIS — J309 Allergic rhinitis, unspecified: Secondary | ICD-10-CM

## 2024-01-09 LAB — CBC WITH DIFFERENTIAL/PLATELET
Basophils Absolute: 0.1 10*3/uL (ref 0.0–0.1)
Basophils Relative: 0.8 % (ref 0.0–3.0)
Eosinophils Absolute: 0.2 10*3/uL (ref 0.0–0.7)
Eosinophils Relative: 2 % (ref 0.0–5.0)
HCT: 48.9 % (ref 39.0–52.0)
Hemoglobin: 16.4 g/dL (ref 13.0–17.0)
Lymphocytes Relative: 29.4 % (ref 12.0–46.0)
Lymphs Abs: 2.5 10*3/uL (ref 0.7–4.0)
MCHC: 33.5 g/dL (ref 30.0–36.0)
MCV: 98.4 fl (ref 78.0–100.0)
Monocytes Absolute: 0.9 10*3/uL (ref 0.1–1.0)
Monocytes Relative: 10.8 % (ref 3.0–12.0)
Neutro Abs: 4.8 10*3/uL (ref 1.4–7.7)
Neutrophils Relative %: 57 % (ref 43.0–77.0)
Platelets: 204 10*3/uL (ref 150.0–400.0)
RBC: 4.97 Mil/uL (ref 4.22–5.81)
RDW: 15.1 % (ref 11.5–15.5)
WBC: 8.5 10*3/uL (ref 4.0–10.5)

## 2024-01-09 MED ORDER — LEVOCETIRIZINE DIHYDROCHLORIDE 5 MG PO TABS: 5.0000 mg | ORAL_TABLET | Freq: Every evening | ORAL | 5 refills | Status: DC

## 2024-01-09 MED ORDER — FLUTICASONE PROPIONATE 50 MCG/ACT NA SUSP: 2.0000 | Freq: Every day | NASAL | 2 refills | Status: DC

## 2024-01-09 NOTE — Patient Instructions (Addendum)
 Continue Albuterol inhaler 2 puffs every 6 hours as needed for shortness of breath or wheezing. Notify if symptoms persist despite rescue inhaler/neb use.  Continue Breztri 2 puffs Twice daily. Need to make sure you're using this every day    Levocetrizine 5 mg daily for allergies/runny nose  Flonase nasal 2 sprays each nostril daily for nasal congestion/drainage   Your lung function testing showed moderate COPD. Best thing you can do for yourself is to quit smoking. Use the nicotine patches - 21 mcg patch daily for 6 weeks then 14 mcg for 2 weeks then 7 mcg for 2 weeks since these have worked well for you in the past. Rotate where you are putting it on daily.   You do have some changes on your CT scan which could be early interstitial lung disease related to your rheumatoid arthritis or it could be smoking related respiratory bronchiolitis, which is inflammation and should improve if you stop smoking  Someone should call you with the results of your CT sinus in the next week or so  Follow up in 4 months with Dr. Marchelle Gearing or Philis Nettle. If symptoms do not improve or worsen, please contact office for sooner follow up or seek emergency care.

## 2024-01-09 NOTE — Progress Notes (Unsigned)
 @Patient  ID: Gerald Odom, male    DOB: 10/01/1951, 73 y.o.   MRN: [id number]  Chief Complaint  Patient presents with   Follow-up    Patient is doing well.    Referring provider: Blane Ohara, MD  HPI: 73 year old male, active smoker followed for emphysema, concerns for early ILD.  He is a patient of Dr. Jane Canary and last seen in office 11/09/2023.  Past medical history significant for rheumatoid arthritis on Enbrel and methotrexate, hypertension, deviated septum, gout.  TEST/EVENTS:  07/10/2023 LDCT LCS CT: Atherosclerosis.  Centrilobular and paraseptal emphysema.  Basilar centrilobular and peripheral groundglass, suspicious for respiratory bronchiolitis interstitial lung disease.  7.7 mm nodule along minor fissure, unchanged.  Debris in airway.  Chronic calcific pancreatitis.  Lung RADS 2 11/09/2023 RAST negative; A1 AT MM, nl 01/08/2024 PFT: FVC 83, FEV1 67, ratio 58, TLC 94, DLCO 55. Moderate COPD and moderate diffusion defect   11/09/2023: OV with Dr. Marchelle Gearing for pulmonary consult.  Referred because of concerns for early ILD, discovered on fall 2024 CT scan of the chest done for lung cancer screening. Possible baseline groundglass opacities suggestive of early interstitial lung abnormality/early ILD. Ongoing smoker.  Does have a diagnosis of emphysema for which he is on [place].  Has a 9-year diagnosis of RA.  Followed by Dr. Allena Katz.  On Enbrel and methotrexate.  Does have issues with nasal congestion, especially in the morning and last hour of the day.  Does get dyspneic doing yard work but thinks it is more so because of his stuffiness of his nose.  Does admit that the shortness of breath has been ongoing for a year and may be getting worse.  More concerned about his sinuses.  Ordered CT of the sinus.  Allergen panel and CBC.  Alpha-1.  Ordered full PFT.   [facility] Comprehensive ILD Questionnaire   Symptoms:    SYMPTOM SCALE - ILD 11/09/2023  Current weight    O2 use ra   Shortness of Breath 0 -> 5 scale with 5 being worst (score 6 If unable to do)  At rest 0  Simple tasks - showers, clothes change, eating, shaving 1  Household (dishes, doing bed, laundry) 1  Shopping 0  Walking level at own pace 0  Walking up Stairs 1  Total (30-36) Dyspnea Score 3          Non-dyspnea symptoms (0-> 5 scale) 11/09/2023  How bad is your cough? 1  How bad is your fatigue 1  How bad is nausea 0  How bad is vomiting?   0  How bad is diarrhea? 0  How bad is anxiety? 0  How bad is depression 0  Any chronic pain - if so where and how bad 0   01/09/2024: Today-follow-up Discussed the use of AI scribe software for clinical note transcription with the patient, who gave verbal consent to proceed.  History of Present Illness   Gerald Odom "Billey Gosling" is a 73 year old male with rheumatoid arthritis and COPD who presents for follow-up on lung function testing and evaluation of respiratory symptoms.  Recent CT scans for lung cancer screening revealed possible early interstitial lung disease with ground glass opacities, potentially related to his rheumatoid arthritis. He is currently awaiting results from a CT scan of his sinuses.  He has a history of smoking and recently quit as of last week. 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  No restriction on PFT with normal TLC. He uses Clinical cytogeneticist,  two puffs twice a day, and has an emergency albuterol inhaler, which he rarely uses.  He experiences nasal congestion and shortness of breath, particularly during physical activities like yard work. His nose often starts running suddenly, requiring frequent blowing, especially in the morning and during physical exertion. He experiences nasal congestion that forces him to breathe through his mouth. He also has a cough that occurs mainly at night, which is mostly clear but occasionally produces green mucus. No fevers or chills. No hemoptysis or weight loss.   His RAST was positive but  experiences symptoms that worsen with pollen exposure. He is not currently on any allergy medication. He has tried nicotine patches and Chantix in the past to quit smoking, with varying success.        No Known Allergies  Immunization History  Administered Date(s) Administered   Fluad Quad(high Dose 65+) 07/31/2020, 11/18/2021, 09/02/2022   Fluad Trivalent(High Dose 65+) 07/05/2023   Influenza-Unspecified 06/11/2019   PFIZER(Purple Top)SARS-COV-2 Vaccination 05/16/2020, 06/13/2020, 12/17/2020   Pfizer Covid-19 Vaccine Bivalent Booster 49yrs & up 11/18/2021   Pneumococcal Conjugate-13 09/22/2015   Pneumococcal Polysaccharide-23 09/17/2013, 03/10/2021   Td 01/02/2019   Tdap 10/25/2011    Past Medical History:  Diagnosis Date   Acute cholangitis 05/14/2016   Arthritis    Rheumatoid arthritis   Choledocholithiasis 05/16/2016   DVT (deep venous thrombosis) (HCC) 02/10/2021       Gallstone pancreatitis 05/14/2016   Gout    Hearing loss of left ear due to cerumen impaction 02/09/2020   Hyperlipidemia    Hypertension    Pancreatitis 05/2016   [facility]osp[facility]   Pneumonia 05/2016    Tobacco History: Social History   Tobacco Use  Smoking Status Every Day  Smokeless Tobacco Never  Tobacco Comments   CT Screening ordered 03/15/22. A pack or a pack and half 01/09/2024.   Ready to quit: Not Answered Counseling given: Not Answered Tobacco comments: CT Screening ordered 03/15/22. A pack or a pack and half 01/09/2024.   Outpatient Medications Prior to Visit  Medication Sig Dispense Refill   albuterol (VENTOLIN HFA) 108 (90 Base) MCG/ACT inhaler TAKE 2 PUFFS BY MOUTH EVERY 6 HOURS AS NEEDED FOR WHEEZE OR SHORTNESS OF BREATH 18 each 2   allopurinol (ZYLOPRIM) 300 MG tablet TAKE 1 TABLET BY MOUTH EVERY DAY BEFORE BREAKFAST 90 tablet 1   Budeson-Glycopyrrol-Formoterol (BREZTRI AEROSPHERE) 160-9-4.8 MCG/ACT AERO Inhale 2 puffs into the lungs 2 (two) times daily. 10.7 g 5   celecoxib (CELEBREX)  100 MG capsule Take 200 mg by mouth.     etanercept (ENBREL) 50 MG/ML injection Inject 50 mg into the skin once a week.     folic acid (FOLVITE) 1 MG tablet Take by mouth.     lisinopril (ZESTRIL) 5 MG tablet TAKE 1 TABLET BY MOUTH TWICE A DAY 180 tablet 1   methotrexate (RHEUMATREX) 2.5 MG tablet Take 15 mg by mouth once a week.     predniSONE (DELTASONE) 5 MG tablet Take 1 tablet (5 mg total) by mouth daily as needed. 30 tablet 0   rosuvastatin (CRESTOR) 10 MG tablet TAKE 1 TABLET BY MOUTH EVERY DAY 90 tablet 1   sildenafil (REVATIO) 20 MG tablet 3-5 TABLETS ONCE DAILY ONE HOUR PRIOR TO INTERCOUSE. 50 tablet 0   No facility-administered medications prior to visit.     Review of Systems:   Constitutional: No weight loss or gain, night sweats, fevers, chills, fatigue, or lassitude. HEENT: No headaches, difficulty swallowing, tooth/dental problems, or  sore throat. No sneezing, itching, ear ache +nasal congestion, post nasal drip CV:  No chest pain, orthopnea, PND, swelling in lower extremities, anasarca, dizziness, palpitations, syncope Resp:+shortness of breath with exertion; cough. No excess mucus or change in color of mucus. No hemoptysis. No wheezing.  No chest wall deformity GI:  No heartburn, indigestion, abdominal pain, nausea, vomiting, diarrhea, change in bowel habits, loss of appetite, bloody stools.  GU: No dysuria, change in color of urine, urgency or frequency.   Skin: No rash, lesions, ulcerations MSK:  +chronic joint pains  Neuro: No dizziness or lightheadedness.  Psych: No depression or anxiety. Mood stable.     Physical Exam:  BP (!) 140/94 (BP Location: Left Arm, Patient Position: Sitting, Cuff Size: Normal)   Pulse 71   Ht 5\' 7"  (1.702 m)   Wt 172 lb 9.6 oz (78.3 kg)   SpO2 93%   BMI 27.03 kg/m   GEN: Pleasant, interactive, well-appearing; in no acute distress HEENT:  Normocephalic and atraumatic.  PERRLA. Sclera white. Nasal turbinates erythematous, moist and  patent bilaterally. Clear rhinorrhea present. Oropharynx pink and moist, without exudate or edema. No lesions, ulcerations NECK:  Supple w/ fair ROM. No JVD present. Normal carotid impulses w/o bruits. Thyroid symmetrical with no goiter or nodules palpated. No lymphadenopathy.   CV: RRR, no m/r/g, no peripheral edema. Pulses intact, +2 bilaterally. No cyanosis, pallor or clubbing. PULMONARY:  Unlabored, regular breathing. Clear bilaterally A&P w/o wheezes/rales/rhonchi. No accessory muscle use.  GI: BS present and normoactive. Soft, non-tender to palpation. No organomegaly or masses detected.  MSK: No erythema, warmth or tenderness. Cap refil <2 sec all extrem. No deformities or joint swelling noted.  Neuro: A/Ox3. No focal deficits noted.   Skin: Warm, no lesions or rashe Psych: Normal affect and behavior. Judgement and thought content appropriate.     Lab Results:  CBC    Component Value Date/Time   WBC 10.7 06/12/2023 1118   WBC 18.4 (H) 07/05/2016 1527   RBC 4.83 06/12/2023 1118   RBC 4.45 07/05/2016 1527   HGB 16.3 06/12/2023 1118   HCT 47.9 06/12/2023 1118   PLT 191 06/12/2023 1118   MCV 99 (H) 06/12/2023 1118   MCH 33.7 (H) 06/12/2023 1118   MCH 31.9 07/05/2016 1527   MCHC 34.0 06/12/2023 1118   MCHC 33.8 07/05/2016 1527   RDW 13.4 06/12/2023 1118   LYMPHSABS 3.4 (H) 06/12/2023 1118   MONOABS 1.6 (H) 07/05/2016 1527   EOSABS 0.2 06/12/2023 1118   BASOSABS 0.1 06/12/2023 1118    BMET    Component Value Date/Time   NA 138 06/12/2023 1118   K 4.7 06/12/2023 1118   CL 104 06/12/2023 1118   CO2 21 06/12/2023 1118   GLUCOSE 98 06/12/2023 1118   GLUCOSE 123 (H) 07/05/2016 1527   BUN 10 06/12/2023 1118   CREATININE 0.91 06/12/2023 1118   CALCIUM 9.4 06/12/2023 1118   GFRNONAA 65 12/01/2020 0828   GFRAA 75 12/01/2020 0828    BNP No results found for: "BNP"   Imaging:  No results found.  Administration History     None          Latest Ref Rng & Units  01/08/2024   10:03 AM  PFT Results  FVC-Pre L 3.12  P  FVC-Predicted Pre % 83  P  FVC-Post L 3.26  P  FVC-Predicted Post % 87  P  Pre FEV1/FVC % % 58  P  Post FEV1/FCV % % 58  P  FEV1-Pre L 1.81  P  FEV1-Predicted Pre % 67  P  FEV1-Post L 1.88  P  DLCO uncorrected ml/min/mmHg 12.61  P  DLCO UNC% % 55  P  DLCO corrected ml/min/mmHg 12.61  P  DLCO COR %Predicted % 55  P  DLVA Predicted % 69  P  TLC L 6.10  P  TLC % Predicted % 94  P  RV % Predicted % 112  P    P Preliminary result    No results found for: "NITRICOXIDE"      Assessment & Plan:     Chronic Obstructive Pulmonary Disease (COPD) Moderate COPD with reduced DLCO, likely due to emphysema. Active smoking exacerbates progression. Inconsistent inhaler use noted. Smoking cessation is critical to slow disease progression. Breztri inhaler is essential for maintaining lung function/reducing hospitalizations. Lung exam clear today. Encouraged to work on graded exercises. Trigger prevention. Action plan in place.  - Advise smoking cessation and initiate Chantix. Side effect profile reviewed. - Utilize Breztri inhaler, two puffs twice daily consistently. Brush tongue and rinse mouth afterwards - Order high-resolution CT scan of the chest to assess for ILD vs bronchiolitis  - Schedule follow-up in 4 months to evaluate smoking cessation and inhaler use.  Interstitial Lung Disease (suspected) CT scan showed mild ground-glass opacities, suggestive of early interstitial lung disease, possibly related to rheumatoid arthritis but also has the appearance of bronchiolitis which could be smoking induced. Condition is mild and requires monitoring. High-resolution CT scan needed to differentiate between interstitial lung disease and bronchiolitis. - Order high-resolution CT scan of the chest to evaluate lung changes and differentiate between interstitial lung disease and bronchiolitis   Rheumatoid Arthritis Rheumatoid arthritis may  contribute to lung changes observed on CT scan. Currently managed with Enbrel and methotrexate. Follow up with rheumatology as scheduled  - Continue management with Enbrel and methotrexate - Follow up with rheumatology as scheduled   Chronic Rhinitis Nasal congestion, rhinorrhea, and post-nasal drip exacerbated by environmental factors like pollen. Previous RAST negative, but symptoms suggest allergic rhinitis. Check IgE. Management will proceed as if he has allergies and reassess response. Wait on CT sinus results.  - Prescribe Xyzal for daily use during allergy season. - Prescribe Flonase nasal spray, two sprays each nostril twice daily, with option to reduce to one spray if dryness occurs. - Recommend over-the-counter Mucinex as needed for congestion.  Smoker Under lung cancer screening - Lung RADS 2. Smoking cessation advised. Pt requested to start Chantix. Side effect profile reviewed.  - Start Chantix  - Work on smoking cessation  Follow-up Follow-up plans to monitor conditions and treatment adherence. High-resolution CT scan to be scheduled at [facility] for convenience.  - Schedule follow-up appointment in 4 months to assess COPD management and smoking cessation progress. - Call with CT scan results once available. - Ensure scheduling of high-resolution CT scan at [facility].        Advised if symptoms do not improve or worsen, to please contact office for sooner follow up or seek emergency care.   I spent 35 minutes of dedicated to the care of this patient on the date of this encounter to include pre-visit review of records, face-to-face time with the patient discussing conditions above, post visit ordering of testing, clinical documentation with the electronic health record, making appropriate referrals as documented, and communicating necessary findings to members of the patients care team.  Noemi Chapel, NP 01/09/2024  Pt aware and understands NP's  role.

## 2024-01-11 ENCOUNTER — Encounter: Payer: Self-pay | Admitting: Nurse Practitioner

## 2024-01-11 LAB — IGE: IgE (Immunoglobulin E), Serum: 139 kU/L — ABNORMAL HIGH (ref <=114)

## 2024-01-11 MED ORDER — VARENICLINE TARTRATE (STARTER) 0.5 MG X 11 & 1 MG X 42 PO TBPK: ORAL_TABLET | ORAL | 0 refills | Status: DC

## 2024-01-11 MED ORDER — VARENICLINE TARTRATE(CONTINUE) 1 MG PO TABS
1.0000 mg | ORAL_TABLET | Freq: Two times a day (BID) | ORAL | 5 refills | Status: DC
Start: 2024-02-01 — End: 2024-03-12

## 2024-01-12 ENCOUNTER — Ambulatory Visit (HOSPITAL_BASED_OUTPATIENT_CLINIC_OR_DEPARTMENT_OTHER)
Admission: RE | Admit: 2024-01-12 | Discharge: 2024-01-12 | Disposition: A | Discharge status: Home / Self Care | Source: Ambulatory Visit | Attending: Nurse Practitioner | Admitting: Nurse Practitioner

## 2024-01-12 DIAGNOSIS — R918 Other nonspecific abnormal finding of lung field: Secondary | ICD-10-CM | POA: Diagnosis not present

## 2024-01-12 DIAGNOSIS — F17210 Nicotine dependence, cigarettes, uncomplicated: Secondary | ICD-10-CM

## 2024-01-12 DIAGNOSIS — R0609 Other forms of dyspnea: Secondary | ICD-10-CM | POA: Diagnosis not present

## 2024-01-12 DIAGNOSIS — R0689 Other abnormalities of breathing: Secondary | ICD-10-CM | POA: Diagnosis not present

## 2024-01-12 DIAGNOSIS — M0510 Rheumatoid lung disease with rheumatoid arthritis of unspecified site: Secondary | ICD-10-CM | POA: Diagnosis not present

## 2024-02-09 NOTE — Progress Notes (Signed)
 Findings on HRCT consistent with ILD. Appearance is not necessarily typical of RA ILD but still possible. Slight progression over the last few years. Since he was stable at his laast OV, please change follow up to Dr. Bertrum Brodie to review next steps/monitoring. Thanks!

## 2024-02-13 ENCOUNTER — Telehealth: Payer: Self-pay | Admitting: Nurse Practitioner

## 2024-02-13 NOTE — Progress Notes (Signed)
 Called pt and there was no answer-LMTCB

## 2024-02-13 NOTE — Telephone Encounter (Signed)
 Message Received: 4 days ago Roetta Clarke, NP  Gerald Odom, CMA Findings on HRCT consistent with ILD. Appearance is not necessarily typical of RA ILD but still possible. Slight progression over the last few years. Since he was stable at his [facility] OV, please change follow up to Dr. Bertrum Brodie to review next steps/monitoring. Thanks!   Called the pt and there was no answer- LMTCB

## 2024-02-14 NOTE — Telephone Encounter (Signed)
 Copied from CRM [id number]. Topic: Clinical - Lab/Test Results >> Feb 13, 2024 11:26 AM Crist Dominion wrote: Reason for CRM: Patient states he missed a call from [facility] and is requesting to speak with her regarding his CT results.  Spoke with patient regarding prior message.Gave patient HRCT scan result's  Findings on HRCT consistent with ILD. Appearance is not necessarily typical of RA ILD but still possible. Slight progression over the last few years. Since he was stable at his [facility] OV, please change follow up to Dr. Bertrum Brodie to review next steps/monitoring  Patient's voice was understanding.Nothing  else further needed.

## 2024-02-19 DIAGNOSIS — R9389 Abnormal findings on diagnostic imaging of other specified body structures: Secondary | ICD-10-CM | POA: Diagnosis not present

## 2024-02-19 DIAGNOSIS — M1A09X0 Idiopathic chronic gout, multiple sites, without tophus (tophi): Secondary | ICD-10-CM | POA: Diagnosis not present

## 2024-02-19 DIAGNOSIS — M0579 Rheumatoid arthritis with rheumatoid factor of multiple sites without organ or systems involvement: Secondary | ICD-10-CM | POA: Diagnosis not present

## 2024-02-19 DIAGNOSIS — Z7960 Long term (current) use of unspecified immunomodulators and immunosuppressants: Secondary | ICD-10-CM | POA: Diagnosis not present

## 2024-02-20 ENCOUNTER — Telehealth: Payer: Self-pay | Admitting: Family Medicine

## 2024-02-20 NOTE — Telephone Encounter (Signed)
 Called to try to schedule an appointment for a follow up with Dr.Cox. Left patient a voicemail to give the office a call if and when he would like to get an appointment scheduled.

## 2024-03-11 ENCOUNTER — Other Ambulatory Visit: Payer: Self-pay | Admitting: Family Medicine

## 2024-03-11 ENCOUNTER — Other Ambulatory Visit: Payer: Self-pay | Admitting: Nurse Practitioner

## 2024-03-11 DIAGNOSIS — F17210 Nicotine dependence, cigarettes, uncomplicated: Secondary | ICD-10-CM

## 2024-03-11 DIAGNOSIS — J309 Allergic rhinitis, unspecified: Secondary | ICD-10-CM

## 2024-04-13 DIAGNOSIS — Z23 Encounter for immunization: Secondary | ICD-10-CM | POA: Diagnosis not present

## 2024-04-13 DIAGNOSIS — S61411A Laceration without foreign body of right hand, initial encounter: Secondary | ICD-10-CM | POA: Diagnosis not present

## 2024-04-13 DIAGNOSIS — F17210 Nicotine dependence, cigarettes, uncomplicated: Secondary | ICD-10-CM | POA: Diagnosis not present

## 2024-04-13 DIAGNOSIS — S61451A Open bite of right hand, initial encounter: Secondary | ICD-10-CM | POA: Diagnosis not present

## 2024-04-13 DIAGNOSIS — Z7901 Long term (current) use of anticoagulants: Secondary | ICD-10-CM | POA: Diagnosis not present

## 2024-04-13 DIAGNOSIS — M79641 Pain in right hand: Secondary | ICD-10-CM | POA: Diagnosis not present

## 2024-04-13 DIAGNOSIS — Z203 Contact with and (suspected) exposure to rabies: Secondary | ICD-10-CM | POA: Diagnosis not present

## 2024-04-13 DIAGNOSIS — J449 Chronic obstructive pulmonary disease, unspecified: Secondary | ICD-10-CM | POA: Diagnosis not present

## 2024-04-13 DIAGNOSIS — Z2914 Encounter for prophylactic rabies immune globin: Secondary | ICD-10-CM | POA: Diagnosis not present

## 2024-04-13 DIAGNOSIS — W540XXA Bitten by dog, initial encounter: Secondary | ICD-10-CM | POA: Diagnosis not present

## 2024-04-13 DIAGNOSIS — I10 Essential (primary) hypertension: Secondary | ICD-10-CM | POA: Diagnosis not present

## 2024-04-13 DIAGNOSIS — S61431A Puncture wound without foreign body of right hand, initial encounter: Secondary | ICD-10-CM | POA: Diagnosis not present

## 2024-04-16 DIAGNOSIS — Z23 Encounter for immunization: Secondary | ICD-10-CM | POA: Diagnosis not present

## 2024-04-16 DIAGNOSIS — Z2914 Encounter for prophylactic rabies immune globin: Secondary | ICD-10-CM | POA: Diagnosis not present

## 2024-04-16 DIAGNOSIS — Z203 Contact with and (suspected) exposure to rabies: Secondary | ICD-10-CM | POA: Diagnosis not present

## 2024-05-10 ENCOUNTER — Ambulatory Visit: Admitting: Nurse Practitioner

## 2024-05-24 ENCOUNTER — Ambulatory Visit: Admitting: Nurse Practitioner

## 2024-06-06 NOTE — Progress Notes (Unsigned)
 OV 11/09/2023  Subjective:  Patient ID: Gerald Odom, male , DOB: 1951/09/16 , age 73 y.o. , MRN: [id number] , ADDRESS: [address] [place] [address] PCP Sherre Clapper, MD Patient Care Team: Sherre Clapper, MD as PCP - General (Family Medicine) Tobie Lady POUR, MD as Consulting Physician (Rheumatology) Nyle Rankin POUR, [facility] (Inactive) (Pharmacist)  This Provider for this visit: Treatment Team:  Attending Provider: Geronimo Amel, MD    11/09/2023 -   Chief Complaint  Patient presents with   Consult    Ct 10/7, lung nodule and excessive chest congestion      HPI Gerald Odom 73 y.o. -has been referred here because of concerns of early interstitial lung disease discovered on fall 2024 CT scan of the chest done for lung cancer screening.  History is obtained from him and also review of the medical records.  He is retired.  He is a ongoing smoker.  He does have a diagnosis of emphysema for which she is on breast tree.  He also has a 9-year diagnosis of rheumatoid arthritis or 10 years.  He sees Dr. Tobie at [place] [facility].  He is on both Enbrel for 8 years and methotrexate for several years.  He gives his Enbrel injections himself.  Without this he says his pain will be significant.  He says he feels fine except that his nose gets very stuffy early in the morning and last 1 hour rest of the day he is largely fine but occasionally it will stuff up.  He says he gets dyspneic doing yard work but he thinks is because of stuffiness of the nose.  But he does admit the shortness of breath has been going on for 1 year and might be getting worse.  However he is more concerned about his sinuses.  He had a CT scan of the chest low-dose lung cancer protocol.  Emphysema is seen.  Dr. Jeremy thought he had some baseline groundglass opacities suggestive of early interstitial lung abnormality/early ILD and therefore has been referred here.  He himself is not aware of this other than the  something in his lung   He did do the ILD questionnaire and it is below.     [facility] Comprehensive ILD Questionnaire  Symptoms:     Past Medical History :  Emphysema/COPD given in 2017 Rheumatoid arthritis for 10 years on immunosuppressive medications He had a history of pneumonia not otherwise specified 7 years ago History of blood clots not otherwise specified 3 years ago He has had COVID disease and the vaccine.  He was hospitalized in 2021 January.  Details not known.   ROS:  Positive for fatigue and arthralgia but no dysphagia.  He also has snoring  FAMILY HISTORY of LUNG DISEASE:  Negative for any pulmonary diseases in the family  PERSONAL EXPOSURE HISTORY:  Ongoing smoking since 17 7420 cigarettes/day which is 1 pack.SABRA  He might smoke marijuana intermittently 1/year.  This been going on since 1967.  HOME  EXPOSURE and HOBBY DETAILS :  Lives in a single-family home.  The home is [address] years old.  He has been living there for 19 years.  Detail organic antigen exposure history is negative.  OCCUPATIONAL HISTORY (122 questions) : He is retired after working at [employer] lot.  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  Otherwise negative  PULMONARY TOXICITY HISTORY (27 items):  He has been on methotrexate for 8  years on Enbrel for 8 years.  Was on prednisone  briefly 8 years ago.  He is also on allopurinol  [can cause pulmonary eosinophilia.  Other medicines include lisinopril  [can cause cough] aspirin folic acid  INVESTIGATIONS: Only low-dose CT scan of the chest in fall 2024.  There is no high-resolution CT chest and I cannot even visualize the low-dose CT scan of the chest.    01/09/2024: Today-follow-up Discussed the use of AI scribe software for clinical note transcription with the patient, who gave verbal consent to proceed.  History of Present Illness   Jaquell Odom is a 73  year old male with rheumatoid arthritis and COPD who presents for follow-up on lung function testing and evaluation of respiratory symptoms.  Recent CT scans for lung cancer screening revealed possible early interstitial lung disease with ground glass opacities, potentially related to his rheumatoid arthritis. He is currently awaiting results from a CT scan of his sinuses.  He has a history of smoking and recently quit as of last week. 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  No restriction on PFT with normal TLC. He uses Breztri , two puffs twice a day, and has an emergency albuterol  inhaler, which he rarely uses.  He experiences nasal congestion and shortness of breath, particularly during physical activities like yard work. His nose often starts running suddenly, requiring frequent blowing, especially in the morning and during physical exertion. He experiences nasal congestion that forces him to breathe through his mouth. He also has a cough that occurs mainly at night, which is mostly clear but occasionally produces green mucus. No fevers or chills. No hemoptysis or weight loss.   His RAST was positive but experiences symptoms that worsen with pollen exposure. He is not currently on any allergy medication. He has tried nicotine patches and Chantix  in the past to quit smoking, with varying success.      OV 06/06/2024  Subjective:  Patient ID: Gerald Odom, male , DOB: [birth year] , age 70 y.o. , MRN: [id number] , ADDRESS: [address] [place] [address] PCP Sherre Clapper, MD Patient Care Team: Sherre Clapper, MD as PCP - General (Family Medicine) Tobie Lady POUR, MD as Consulting Physician (Rheumatology) Nyle Rankin POUR, [facility] (Inactive) (Pharmacist)  This Provider for this visit: Treatment Team:  Attending Provider: Geronimo Amel, MD    06/06/2024 -  No chief complaint on file.    HPI [name] 73 y.o. -    SYMPTOM SCALE - ILD 11/09/2023  Current weight   O2 use ra   Shortness of Breath 0 -> 5 scale with 5 being worst (score 6 If unable to do)  At rest 0  Simple tasks - showers, clothes change, eating, shaving 1  Household (dishes, doing bed, laundry) 1  Shopping 0  Walking level at own pace 0  Walking up Stairs 1  Total (30-36) Dyspnea Score 3      Non-dyspnea symptoms (0-> 5 scale) 11/09/2023  How bad is your cough? 1  How bad is your fatigue 1  How bad is nausea 0  How bad is vomiting?  0  How bad is diarrhea? 0  How bad is anxiety? 0  How bad is depression 0  Any chronic pain - if so where and how bad 0    Simple office walk 224 (66+46 x 2) feet Pod A at [facility] x  3 laps goal with forehead probe 11/09/2023    O2 used ra   Number  laps completed Sit stand   Comments about pace fast   Resting Pulse Ox/HR 96% and 89/min   Final Pulse Ox/HR 89% and 93/min   Desaturated </= 88% No but almost   Desaturated <= 3% points yes   Got Tachycardic >/= 90/min yes   Symptoms at end of test No dyspnea   Miscellaneous comments x    CT Chest data from date: ****  - personally visualized and independently interpreted : *** - my findings are: ***   PFT     Latest Ref Rng & Units 01/08/2024   10:03 AM  PFT Results  FVC-Pre L 3.12   FVC-Predicted Pre % 83   FVC-Post L 3.26   FVC-Predicted Post % 87   Pre FEV1/FVC % % 58   Post FEV1/FCV % % 58   FEV1-Pre L 1.81   FEV1-Predicted Pre % 67   FEV1-Post L 1.88   DLCO uncorrected ml/min/mmHg 12.61   DLCO UNC% % 55   DLCO corrected ml/min/mmHg 12.61   DLCO COR %Predicted % 55   DLVA Predicted % 69   TLC L 6.10   TLC % Predicted % 94   RV % Predicted % 112        LAB RESULTS last 96 hours No results found.       has a past medical history of Acute cholangitis (05/14/2016), Arthritis, Choledocholithiasis (05/16/2016), DVT (deep venous thrombosis) (HCC) (02/10/2021), Gallstone pancreatitis (05/14/2016), Gout, Hearing loss of left ear due to cerumen impaction (02/09/2020),  Hyperlipidemia, Hypertension, Pancreatitis (05/2016), and Pneumonia (05/2016).   reports that he has been smoking. He has never used smokeless tobacco.  Past Surgical History:  Procedure Laterality Date   CATARACT EXTRACTION Bilateral 2022   CHOLECYSTECTOMY     CHOLECYSTECTOMY N/A 07/04/2016   Procedure: LAPAROSCOPIC CHOLECYSTECTOMY WITH INTRAOPERATIVE CHOLANGIOGRAM;  Surgeon: Krystal Russell, MD;  [facility] OR;  Service: General;  Laterality: N/A;   ERCP N/A 05/16/2016   Procedure: ENDOSCOPIC RETROGRADE CHOLANGIOPANCREATOGRAPHY (ERCP);  Surgeon: Victory LITTIE Legrand DOUGLAS, MD;  Location: [facility];  Service: Endoscopy;  Laterality: N/A;   KNEE SURGERY Left 1997    No Known Allergies  Immunization History  Administered Date(s) Administered   Fluad Quad(high Dose 65+) 07/31/2020, 11/18/2021, 09/02/2022   Fluad Trivalent(High Dose 65+) 07/05/2023   Influenza-Unspecified 06/11/2019   PFIZER(Purple Top)SARS-COV-2 Vaccination 05/16/2020, 06/13/2020, 12/17/2020   Pfizer Covid-19 Vaccine Bivalent Booster 4yrs & up 11/18/2021   Pneumococcal Conjugate-13 09/22/2015   Pneumococcal Polysaccharide-23 09/17/2013, 03/10/2021   Td 01/02/2019   Tdap 10/25/2011    Family History  Problem Relation Age of Onset   Anxiety disorder Father      Current Outpatient Medications:    albuterol  (VENTOLIN  HFA) 108 (90 Base) MCG/ACT inhaler, TAKE 2 PUFFS BY MOUTH EVERY 6 HOURS AS NEEDED FOR WHEEZE OR SHORTNESS OF BREATH, Disp: 18 each, Rfl: 2   allopurinol  (ZYLOPRIM ) 300 MG tablet, TAKE 1 TABLET BY MOUTH EVERY DAY BEFORE BREAKFAST, Disp: 90 tablet, Rfl: 1   Budeson-Glycopyrrol-Formoterol (BREZTRI  AEROSPHERE) 160-9-4.8 MCG/ACT AERO, Inhale 2 puffs into the lungs 2 (two) times daily., Disp: 10.7 g, Rfl: 5   celecoxib (CELEBREX) 100 MG capsule, Take 200 mg by mouth., Disp: , Rfl:    etanercept (ENBREL) 50 MG/ML injection, Inject 50 mg into the skin once a week., Disp: , Rfl:    fluticasone  (FLONASE ) 50  MCG/ACT nasal spray, SPRAY 2 SPRAYS INTO EACH NOSTRIL EVERY DAY, Disp: 48 mL, Rfl: 5   folic acid (FOLVITE) 1 MG tablet,  Take by mouth., Disp: , Rfl:    levocetirizine (XYZAL ) 5 MG tablet, Take 1 tablet (5 mg total) by mouth every evening., Disp: 30 tablet, Rfl: 5   lisinopril  (ZESTRIL ) 5 MG tablet, TAKE 1 TABLET BY MOUTH TWICE A DAY, Disp: 180 tablet, Rfl: 1   methotrexate (RHEUMATREX) 2.5 MG tablet, Take 15 mg by mouth once a week., Disp: , Rfl:    predniSONE  (DELTASONE ) 5 MG tablet, Take 1 tablet (5 mg total) by mouth daily as needed., Disp: 30 tablet, Rfl: 0   rosuvastatin  (CRESTOR ) 10 MG tablet, TAKE 1 TABLET BY MOUTH EVERY DAY, Disp: 90 tablet, Rfl: 1   sildenafil  (REVATIO ) 20 MG tablet, 3-5 TABLETS ONCE DAILY ONE HOUR PRIOR TO INTERCOUSE., Disp: 50 tablet, Rfl: 0   varenicline  (CHANTIX ) 1 MG tablet, TAKE 1 MG BY MOUTH 2 (TWO) TIMES DAILY. FOLLOWING STARTER PACK, Disp: 180 tablet, Rfl: 1   Varenicline  Tartrate, Starter, 0.5 MG X 11 & 1 MG X 42 TBPK, TAKE AS DIRECTED, Disp: 53 each, Rfl: 1      Objective:   There were no vitals filed for this visit.  Estimated body mass index is 27.03 kg/m as calculated from the following:   Height as of 01/09/24: 5' 7 (1.702 m).   Weight as of 01/09/24: 172 lb 9.6 oz (78.3 kg).  @WEIGHTCHANGE @  There were no vitals filed for this visit.   Physical Exam   General: No distress. *** O2 at rest: *** Cane present: *** Sitting in wheel chair: *** Frail: *** Obese: *** Neuro: Alert and Oriented x 3. GCS 15. Speech normal Psych: Pleasant Resp:  Barrel Chest - ***.  Wheeze - ***, Crackles - ***, No overt respiratory distress CVS: Normal heart sounds. Murmurs - *** Ext: Stigmata of Connective Tissue Disease - *** HEENT: Normal upper airway. PEERL +. No post nasal drip        Assessment/     Assessment & Plan COPD, moderate (HCC)    PLAN There are no Patient Instructions on file for this visit.    FOLLOWUP    No follow-ups on  file.    SIGNATURE    Dr. Dorethia Cave, M.D., F.C.C.P,  Pulmonary and Critical Care Medicine Staff Physician, [facility] Director - Interstitial Lung Disease  Program  Pulmonary Fibrosis [facility] [place], [place], [address]  Pager: [phone], If no answer or between  15:00h - 7:00h: call [phone] Telephone: [phone]  6:39 PM 06/06/2024   Moderate Complexity MDM OFFICE  2021 E/M guidelines, first released in 2021, with minor revisions added in 2023 and 2024 Must meet the requirements for 2 out of 3 dimensions to qualify.    Number and complexity of problems addressed Amount and/or complexity of data reviewed Risk of complications and/or morbidity  One or more chronic illness with mild exacerbation, OR progression, OR  side effects of treatment  Two or more stable chronic illnesses  One undiagnosed new problem with uncertain prognosis  One acute illness with systemic symptoms   One Acute complicated injury Must meet the requirements for 1 of 3 of the categories)  Category 1: Tests and documents, historian  Any combination of 3 of the following:  Assessment requiring an independent historian  Review of prior external note(s) from each unique source  Review of results of each unique test  Ordering of each unique test    Category 2: Interpretation of tests  Independent interpretation of a test performed by another physician/other qualified health care professional (not separately reported)  Category 3: Discuss management/tests  Discussion of management or test interpretation with external physician/other qualified health care professional/appropriate source (not separately reported) Moderate risk of morbidity from additional diagnostic testing or treatment Examples only:  Prescription drug management  Decision regarding minor surgery with identfied patient or procedure risk  factors  Decision regarding elective major surgery without identified patient or procedure risk factors  Diagnosis or treatment significantly limited by social determinants of health             HIGh Complexity  OFFICE   2021 E/M guidelines, first released in 2021, with minor revisions added in 2023. Must meet the requirements for 2 out of 3 dimensions to qualify.    Number and complexity of problems addressed Amount and/or complexity of data reviewed Risk of complications and/or morbidity  Severe exacerbation of chronic illness  Acute or chronic illnesses that may pose a threat to life or bodily function, e.g., multiple trauma, acute MI, pulmonary embolus, severe respiratory distress, progressive rheumatoid arthritis, psychiatric illness with potential threat to self or others, peritonitis, acute renal failure, abrupt change in neurological status Must meet the requirements for 2 of 3 of the categories)  Category 1: Tests and documents, historian  Any combination of 3 of the following:  Assessment requiring an independent historian  Review of prior external note(s) from each unique source  Review of results of each unique test  Ordering of each unique test    Category 2: Interpretation of tests    Independent interpretation of a test performed by another physician/other qualified health care professional (not separately reported)  Category 3: Discuss management/tests  Discussion of management or test interpretation with external physician/other qualified health care professional/appropriate source (not separately reported)  HIGH risk of morbidity from additional diagnostic testing or treatment Examples only:  Drug therapy requiring intensive monitoring for toxicity  Decision for elective major surgery with identified pateint or procedure risk factors  Decision regarding hospitalization or escalation of level of care  Decision for DNR or to de-escalate  care   Parenteral controlled  substances            LEGEND - Independent interpretation involves the interpretation of a test for which there is a CPT code, and an interpretation or report is customary. When a review and interpretation of a test is performed and documented by the provider, but not separately reported (billed), then this would represent an independent interpretation. This report does not need to conform to the usual standards of a complete report of the test. This does not include interpretation of tests that do not have formal reports such as a complete blood count with differential and blood cultures. Examples would include reviewing a chest radiograph and documenting in the medical record an interpretation, but not separately reporting (billing) the interpretation of the chest radiograph.   An appropriate source includes professionals who are not health care professionals but may be involved in the management of the patient, such as a Clinical research associate, upper officer, case manager or teacher, and does not include discussion with family or informal caregivers.    - SDOH: SDOH are the conditions in the environments where people are born, live, learn, work, play, worship, and age that affect a wide range of health, functioning, and quality-of-life outcomes and risks. (e.g., housing, food insecurity, transportation, etc.). SDOH-related Z codes ranging from Z55-Z65 are the ICD-10-CM diagnosis codes used to  document SDOH data Z17 - Problems related to education and literacy Z56 - Problems related to employment and unemployment Z57 - Occupational exposure to risk factors Z58 - Problems related to physical environment Z59 - Problems related to housing and economic circumstances [phone] - Problems related to social environment [phone] - Problems related to upbringing [phone] - Other problems related to primary support group, including family circumstances Z54 - Problems related to certain  psychosocial circumstances Z65 - Problems related to other psychosocial circumstances

## 2024-06-07 ENCOUNTER — Encounter: Payer: Self-pay | Admitting: Internal Medicine

## 2024-06-07 ENCOUNTER — Ambulatory Visit (INDEPENDENT_AMBULATORY_CARE_PROVIDER_SITE_OTHER): Admitting: Internal Medicine

## 2024-06-07 VITALS — BP 110/62 | HR 70 | Ht 67.0 in | Wt 163.0 lb

## 2024-06-07 DIAGNOSIS — R918 Other nonspecific abnormal finding of lung field: Secondary | ICD-10-CM

## 2024-06-07 DIAGNOSIS — R0902 Hypoxemia: Secondary | ICD-10-CM

## 2024-06-07 DIAGNOSIS — J449 Chronic obstructive pulmonary disease, unspecified: Secondary | ICD-10-CM

## 2024-06-07 DIAGNOSIS — F17210 Nicotine dependence, cigarettes, uncomplicated: Secondary | ICD-10-CM | POA: Diagnosis not present

## 2024-06-07 DIAGNOSIS — J439 Emphysema, unspecified: Secondary | ICD-10-CM | POA: Diagnosis not present

## 2024-06-07 DIAGNOSIS — M0579 Rheumatoid arthritis with rheumatoid factor of multiple sites without organ or systems involvement: Secondary | ICD-10-CM | POA: Diagnosis not present

## 2024-06-07 NOTE — Patient Instructions (Addendum)
 ICD-10-CM   1. COPD, moderate (HCC)  J44.9     2. Rheumatoid arthritis involving multiple sites with positive rheumatoid factor (HCC)  M05.79     3. Interstitial lung abnormality (ILA)  R91.8     4. Cigarette smoker  F17.210     5. Exercise hypoxemia  R09.02      #emphysema  - stable  plan  Continue Albuterol  inhaler 2 puffs every 6 hours as needed for shortness of breath or wheezing. Notify if symptoms persist despite rescue inhaler/neb use.  Continue Breztri  2 puffs Twice daily. Need to make sure you're using this every day    #RA/ILA  You do have some changes on your CT scan which could be early interstitial lung disease related to your rheumatoid arthritis (march 2025)   Plan  - repeat HRCT in 6-7 mnths  #Exercise hypoxemia  Plan  - recommend portable o2 but respect your desire to hold off  - monitor symptoms and manage exertion with less stooping, adequate rest, monitoring pulse ox and guarded exertion esp with heat  #Smoking  - too bad relapsed  Plan Dear Gerald Odom,   Congratulations for your interest in quitting smoking!  Find a program that suits you best: when you want to quit, how you need support, where you live, and how you like to learn.    If you're ready to get started TODAY, consider scheduling a visit through [email or web address].  Appointments are available from 8am to 8pm, Monday to Friday.   Most health insurance plans will cover some level of tobacco cessation visits and medications.    Additional Resources: [employer] are also available to help you quit & provide the support you'll need. Many programs are available in both [place] and Spanish and have a long history of successfully helping people get off and stay off tobacco.    Quit Smoking Apps:  quitSTART at [employer].de QuitGuide?at ForgetParking.dk Online education and resources: Smokefree  at [employer].gov Free  Telephone Coaching: QuitNow,  Call 1-800-QUIT-NOW ((906)426-8128) or Text- Ready to [phone] *Quitline [place] has teamed up with Medicaid to offer a free 14 week program    Vaping- Want to Quit? Free 24/7 support. Call [facility] [place]  [place], [place], [place], [place], [place]  [place]    Follow up in 4 months with Dr. Geronimo or [name]. If symptoms do not improve or worsen, please contact office for sooner follow up or seek emergency care.

## 2024-06-08 ENCOUNTER — Other Ambulatory Visit: Payer: Self-pay | Admitting: Nurse Practitioner

## 2024-06-08 DIAGNOSIS — J309 Allergic rhinitis, unspecified: Secondary | ICD-10-CM

## 2024-07-04 ENCOUNTER — Other Ambulatory Visit: Payer: Self-pay | Admitting: Family Medicine

## 2024-09-07 ENCOUNTER — Other Ambulatory Visit: Payer: Self-pay | Admitting: Family Medicine

## 2024-10-02 ENCOUNTER — Ambulatory Visit: Admitting: Nurse Practitioner

## 2024-11-07 ENCOUNTER — Encounter: Payer: Self-pay | Admitting: Internal Medicine

## 2024-11-07 ENCOUNTER — Ambulatory Visit: Admitting: Internal Medicine

## 2024-11-07 VITALS — BP 124/74 | HR 74 | Ht 67.0 in | Wt 170.0 lb

## 2024-11-07 DIAGNOSIS — J449 Chronic obstructive pulmonary disease, unspecified: Secondary | ICD-10-CM | POA: Diagnosis not present

## 2024-11-07 DIAGNOSIS — F17210 Nicotine dependence, cigarettes, uncomplicated: Secondary | ICD-10-CM | POA: Diagnosis not present

## 2024-11-07 DIAGNOSIS — Z23 Encounter for immunization: Secondary | ICD-10-CM | POA: Diagnosis not present

## 2024-11-07 DIAGNOSIS — R918 Other nonspecific abnormal finding of lung field: Secondary | ICD-10-CM

## 2024-11-07 DIAGNOSIS — J439 Emphysema, unspecified: Secondary | ICD-10-CM | POA: Diagnosis not present

## 2024-11-07 DIAGNOSIS — M069 Rheumatoid arthritis, unspecified: Secondary | ICD-10-CM

## 2024-11-07 DIAGNOSIS — R0609 Other forms of dyspnea: Secondary | ICD-10-CM | POA: Diagnosis not present

## 2024-11-07 DIAGNOSIS — M0579 Rheumatoid arthritis with rheumatoid factor of multiple sites without organ or systems involvement: Secondary | ICD-10-CM

## 2024-11-07 NOTE — Progress Notes (Signed)
 "      OV 11/09/2023  Subjective:  Patient ID: Gerald Odom, male , DOB: 06-06-1951 , age 74 y.o. , MRN: [id number] , ADDRESS: [address] [place] [address] PCP Sherre Clapper, MD Patient Care Team: Sherre Clapper, MD as PCP - General (Family Medicine) Tobie Lady POUR, MD as Consulting Physician (Rheumatology) Nyle Rankin POUR, [facility] (Inactive) (Pharmacist)  This Provider for this visit: Treatment Team:  Attending Provider: Geronimo Amel, MD    11/09/2023 -   Chief Complaint  Patient presents with   Consult    Ct 10/7, lung nodule and excessive chest congestion      HPI Gerald Odom 74 y.o. -has been referred here because of concerns of early interstitial lung disease discovered on fall 2024 CT scan of the chest done for lung cancer screening.  History is obtained from him and also review of the medical records.  He is retired.  He is a ongoing smoker.  He does have a diagnosis of emphysema for which she is on breast tree.  He also has a 9-year diagnosis of rheumatoid arthritis or 10 years.  He sees Dr. Tobie at [facility].  He is on both Enbrel for 8 years and methotrexate for several years.  He gives his Enbrel injections himself.  Without this he says his pain will be significant.  He says he feels fine except that his nose gets very stuffy early in the morning and last 1 hour rest of the day he is largely fine but occasionally it will stuff up.  He says he gets dyspneic doing yard work but he thinks is because of stuffiness of the nose.  But he does admit the shortness of breath has been going on for 1 year and might be getting worse.  However he is more concerned about his sinuses.  He had a CT scan of the chest low-dose lung cancer protocol.  Emphysema is seen.  Dr. Jeremy thought he had some baseline groundglass opacities suggestive of early interstitial lung abnormality/early ILD and therefore has been referred here.  He himself is not aware of this other than the  something in his lung   He did do the ILD questionnaire and it is below.     [facility] Comprehensive ILD Questionnaire  Symptoms:     Past Medical History :  Emphysema/COPD given in 2017 Rheumatoid arthritis for 10 years on immunosuppressive medications He had a history of pneumonia not otherwise specified 7 years ago History of blood clots not otherwise specified 3 years ago He has had COVID disease and the vaccine.  He was hospitalized in 2021 January.  Details not known.   ROS:  Positive for fatigue and arthralgia but no dysphagia.  He also has snoring  FAMILY HISTORY of LUNG DISEASE:  Negative for any pulmonary diseases in the family  PERSONAL EXPOSURE HISTORY:  Ongoing smoking since 17 7420 cigarettes/day which is 1 pack.SABRA  He might smoke marijuana intermittently 1/year.  This been going on since 1967.  HOME  EXPOSURE and HOBBY DETAILS :  Lives in a single-family home.  The home is [address] years old.  He has been living there for 19 years.  Detail organic antigen exposure history is negative.  OCCUPATIONAL HISTORY (122 questions) : He is retired after working at [employer].  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  Otherwise negative  PULMONARY TOXICITY HISTORY (27 items):  He has been on methotrexate  for 8 years on Enbrel for 8 years.  Was on prednisone  briefly 8 years ago.  He is also on allopurinol  [can cause pulmonary eosinophilia.  Other medicines include lisinopril  [can cause cough] aspirin folic acid  INVESTIGATIONS: Only low-dose CT scan of the chest in fall 2024.  There is no high-resolution CT chest and I cannot even visualize the low-dose CT scan of the chest.    01/09/2024: Today-follow-up Discussed the use of AI scribe software for clinical note transcription with the patient, who gave verbal consent to proceed.  History of Present Illness   Gerald Odom is a 74  year old male with rheumatoid arthritis and COPD who presents for follow-up on lung function testing and evaluation of respiratory symptoms.  Recent CT scans for lung cancer screening revealed possible early interstitial lung disease with ground glass opacities, potentially related to his rheumatoid arthritis. He is currently awaiting results from a CT scan of his sinuses.  He has a history of smoking and recently quit as of last week. 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  No restriction on PFT with normal TLC. He uses Breztri , two puffs twice a day, and has an emergency albuterol  inhaler, which he rarely uses.  He experiences nasal congestion and shortness of breath, particularly during physical activities like yard work. His nose often starts running suddenly, requiring frequent blowing, especially in the morning and during physical exertion. He experiences nasal congestion that forces him to breathe through his mouth. He also has a cough that occurs mainly at night, which is mostly clear but occasionally produces green mucus. No fevers or chills. No hemoptysis or weight loss.   His RAST was positive but experiences symptoms that worsen with pollen exposure. He is not currently on any allergy medication. He has tried nicotine patches and Chantix  in the past to quit smoking, with varying success.      OV 06/07/2024  Subjective:  Patient ID: Gerald CHRISTELLA Gerald Odom, male , DOB: Feb 20, 1951 , age 54 y.o. , MRN: [id number] , ADDRESS: [address] [place] [address] PCP Sherre Clapper, MD Patient Care Team: Sherre Clapper, MD as PCP - General (Family Medicine) Tobie Lady POUR, MD as Consulting Physician (Rheumatology) Nyle Rankin POUR, [facility] (Inactive) (Pharmacist)  This Provider for this visit: Treatment Team:  Attending Provider: Geronimo Amel, MD    06/07/2024 -   Chief Complaint  Patient presents with   Medical Management of Chronic Issues   COPD    Breathing is overall doing well. He  does have some increased SOB when he does work outside in the hot/humid weather.      HPI STACEY MAURA 74 y.o. -returns for follow-up.  Since I last saw him he saw a nurse practitioner Izetta Rouleau.  He is on inhalers.  He is walking his relapse.  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  He did have excess hypoxemia.  He is reluctant to get portable oxygen  either through insurance or through [email or web address].  We talked about managing his exertion in the heat and with yard work.  I have recommended that he avoid stooping and he monitor his pulse ox and hydrate himself.  Otherwise he is stable.  He is interested in referral to the quit smoking program at [facility] health.    CT Chest data from date: *  - personally visualized  and independently interpreted :-2025 personally visualized yes - my findings are: Upper lobe emphysema and lower lobe either atelectasis or ILa/ILD    OV 11/07/2024  Subjective:  Patient ID: Gerald Odom, male , DOB: Feb 18, 1951 , age 74 y.o. , MRN: [id number] , ADDRESS: [address] [place] [address] PCP Sherre Clapper, MD Patient Care Team: Sherre Clapper, MD as PCP - General (Family Medicine) Tobie Lady POUR, MD as Consulting Physician (Rheumatology) Nyle Rankin POUR, [facility] (Inactive) (Pharmacist)  This Provider for this visit: Treatment Team:  Attending Provider: Geronimo Amel, MD    11/07/2024 -   Chief Complaint  Patient presents with   Medical Management of Chronic Issues   COPD    Breathing has been stable. No new co's.    #Active smoking #Emphysema upper lobe bilaterally but without any ball-valve mechanism normal alpha-1 # Rheumatoid arthritis # Possible ILA lower lobe -high-resolution CT chest March 2025 #rfused o2 fore xercise hypoxemia in 2025  HPI [place] CURRENT 74 y.o. -Ha Placeres Jonavon Trieu is a 74 year old male with rheumatoid  arthritis, smoker, on enbrel/Mtx and respiratory issues  ILA and emphyemawho presents with increased tiredness and shortness of breath. LAst visit summer 2025  He experiences increased tiredness and shortness of breath, particularly during physical activities such as stooping. He requires more frequent breaks and feels more fatigued compared to the previous year.  He uses Flonase  and an inhaler twice daily, which he finds helpful for his breathing issues. He also takes allergy pills prescribed by Dr. Malachy, which he had stopped for a while but has resumed. His breathing is stable but varies with exertion.  For rheumatoid arthritis, he takes methotrexate, eight tablets every Wednesday, and Enbrel once a week. He occasionally uses prednisone  for severe pain but not regularly. He has not had any hospitalizations or emergency room visits since his last dog bite incident.  He has a history of intermittent smoking, with periods of cessation lasting two to three months. He has not smoked this month and mentions that his mind craves it more than his body. He has Chantix  available but has not been using it.  In past he refused o2 for eercise hypoxemia    SYMPTOM SCALE - ILD 11/09/2023  Current weight   O2 use ra  Shortness of Breath 0 -> 5 scale with 5 being worst (score 6 If unable to do)  At rest 0  Simple tasks - showers, clothes change, eating, shaving 1  Household (dishes, doing bed, laundry) 1  Shopping 0  Walking level at own pace 0  Walking up Stairs 1  Total (30-36) Dyspnea Score 3      Non-dyspnea symptoms (0-> 5 scale) 11/09/2023  How bad is your cough? 1  How bad is your fatigue 1  How bad is nausea 0  How bad is vomiting?  0  How bad is diarrhea? 0  How bad is anxiety? 0  How bad is depression 0  Any chronic pain - if so where and how bad 0    Simple office walk 224 (66+46 x 2) feet Pod A at [facility] x  3 laps goal with forehead probe 11/09/2023    O2 used ra   Number laps  completed Sit stand   Comments about pace fast   Resting Pulse Ox/HR 96% and 89/min   Final Pulse Ox/HR 89% and 93/min   Desaturated </= 88% No but almost   Desaturated <= 3% points yes  Got Tachycardic >/= 90/min yes   Symptoms at end of test No dyspnea   Miscellaneous comments x     CT Chest data from date: 01/12/2024  - personally visualized and independently interpreted : no - my findings are: as below  IMPRESSION: 1. Severe bullous emphysema of the lung apices. 2. Dependent bibasilar cystic change, ground-glass, and irregular interstitial opacity with subpleural sparing again seen. These findings have slightly progressed over time on multiple prior examinations and are suspicious for superimposed fibrotic interstitial lung disease, particularly rheumatoid lung disease in this patient with a reported history of rheumatoid arthritis. However, rheumatoid lung disease typically manifests in a UIP like pattern, and today's findings are more atypical. Nonspecific sequelae of chronic aspiration and given heavy smoking history, smoking related respiratory bronchiolitis-interstitial lung disease spectrum are significant differential considerations and all of these are difficult to distinguish on the basis of the imaging. 3. Diffuse bilateral bronchial wall thickening, consistent with nonspecific infectious or inflammatory bronchitis. 4. Coronary artery disease. 5. Stigmata of chronic pancreatitis.   Aortic Atherosclerosis (ICD10-I70.0) and Emphysema (ICD10-J43.9).     Electronically Signed   By: Marolyn JONETTA Jaksch M.D.   On: 02/02/2024 07:59  PFT     Latest Ref Rng & Units 01/08/2024   10:03 AM  PFT Results  FVC-Pre L 3.12   FVC-Predicted Pre % 83   FVC-Post L 3.26   FVC-Predicted Post % 87   Pre FEV1/FVC % % 58   Post FEV1/FCV % % 58   FEV1-Pre L 1.81   FEV1-Predicted Pre % 67   FEV1-Post L 1.88   DLCO uncorrected ml/min/mmHg 12.61   DLCO UNC% % 55   DLCO corrected  ml/min/mmHg 12.61   DLCO COR %Predicted % 55   DLVA Predicted % 69   TLC L 6.10   TLC % Predicted % 94   RV % Predicted % 112        LAB RESULTS last 96 hours No results found.       has a past medical history of Acute cholangitis (HCC) (05/14/2016), Arthritis, Choledocholithiasis (05/16/2016), DVT (deep venous thrombosis) (HCC) (02/10/2021), Gallstone pancreatitis (05/14/2016), Gout, Hearing loss of left ear due to cerumen impaction (02/09/2020), Hyperlipidemia, Hypertension, Pancreatitis (05/2016), and Pneumonia (05/2016).   reports that he has been smoking cigarettes. He has never used smokeless tobacco.  Past Surgical History:  Procedure Laterality Date   CATARACT EXTRACTION Bilateral 2022   CHOLECYSTECTOMY     CHOLECYSTECTOMY N/A 07/04/2016   Procedure: LAPAROSCOPIC CHOLECYSTECTOMY WITH INTRAOPERATIVE CHOLANGIOGRAM;  Surgeon: Krystal Russell, MD;  Location: [facility] OR;  Service: General;  Laterality: N/A;   ERCP N/A 05/16/2016   Procedure: ENDOSCOPIC RETROGRADE CHOLANGIOPANCREATOGRAPHY (ERCP);  Surgeon: Victory LITTIE Legrand DOUGLAS, MD;  Location: [facility];  Service: Endoscopy;  Laterality: N/A;   KNEE SURGERY Left 1997    Allergies[1]  Immunization History  Administered Date(s) Administered   Fluad Quad(high Dose 65+) 07/31/2020, 11/18/2021, 09/02/2022   Fluad Trivalent(High Dose 65+) 07/05/2023   Influenza-Unspecified 06/11/2019   PFIZER(Purple Top)SARS-COV-2 Vaccination 05/16/2020, 06/13/2020, 12/17/2020   Pfizer Covid-19 Vaccine Bivalent Booster 5yrs & up 11/18/2021   Pneumococcal Conjugate-13 09/22/2015   Pneumococcal Polysaccharide-23 09/17/2013, 03/10/2021   Td 01/02/2019   Td,absorbed, Preservative Free, Adult Use, Lf Unspecified 04/13/2024   Tdap 10/25/2011    Family History  Problem Relation Age of Onset   Anxiety disorder Father     Current Medications[2]      Objective:   Vitals:   11/07/24 1255  BP: 124/74  Pulse: 74  SpO2: 92%  Weight: 170 lb (77.1  kg)  Height: 5' 7 (1.702 m)    Estimated body mass index is 26.63 kg/m as calculated from the following:   Height as of this encounter: 5' 7 (1.702 m).   Weight as of this encounter: 170 lb (77.1 kg).  @WEIGHTCHANGE @  [employer]   11/07/24 1255  Weight: 170 lb (77.1 kg)     Physical Exam   General: No distress. Looks well O2 at rest: no Cane present: no Sitting in wheel chair: no Frail: no Obese: no Neuro: Alert and Oriented x 3. GCS 15. Speech normal Psych: Pleasant Resp:  Barrel Chest - no.  Wheeze - no, Crackles - no, No overt respiratory distress CVS: Normal heart sounds. Murmurs - no Ext: Stigmata of Connective Tissue Disease - no HEENT: Normal upper airway. PEERL +. No post nasal drip        Assessment/     Assessment & Plan Rheumatoid arthritis involving multiple sites with positive rheumatoid factor (HCC)  Interstitial lung abnormality (ILA)  Chronic obstructive pulmonary disease with emphysema, unspecified emphysema type (HCC)  Cigarette smoker  DOE (dyspnea on exertion)    PLAN Patient Instructions     ICD-10-CM   1. COPD, moderate (HCC)  J44.9     2. Rheumatoid arthritis involving multiple sites with positive rheumatoid factor (HCC)  M05.79     3. Interstitial lung abnormality (ILA)  R91.8     4. Cigarette smoker  F17.210     5. Exercise hypoxemia  R09.02      #emphysema  - stable  plan  Continue Albuterol  inhaler 2 puffs every 6 hours as needed f Continue Breztri  2 puffs Twice daily. Need to make sure you're using this every day    #RA/ILA  You do have some changes on your CT scan which could be early interstitial lung disease related to your rheumatoid arthritis (march 2025) but back then radiologist was cocnerned this could be getting worse  Also worsening shortness of breath   Plan  - repeat HRCT in < 3 months - do PFT next vist - do ECHO before next visit  - do sit stand test next visit   #Smoking  - too  bad relapsed and on-off smoking but at least making an effort  Plan  - work on quitting  Plan rOV 3 months Ramswamy 15 min    FOLLOWUP    Return in about 3 months (around 02/05/2025) for 15 min visit, with Dr Geronimo, after Spiro and DLCO.    SIGNATURE    Dr. Dorethia Geronimo, M.D., F.C.C.P,  Pulmonary and Critical Care Medicine Staff Physician, [facility] Director - Interstitial Lung Disease  Program  Pulmonary Fibrosis [facility] at [facility] [place], [place], [address]  Pager: [phone], If no answer or between  15:00h - 7:00h: call [phone] Telephone: [phone]  1:18 PM 11/07/2024     [1] No Known Allergies [2]  Current Outpatient Medications:    albuterol  (VENTOLIN  HFA) 108 (90 Base) MCG/ACT inhaler, TAKE 2 PUFFS BY MOUTH EVERY 6 HOURS AS NEEDED FOR WHEEZE OR SHORTNESS OF BREATH, Disp: 18 each, Rfl: 2   allopurinol  (ZYLOPRIM ) 300 MG tablet, TAKE 1 TABLET BY MOUTH EVERY DAY BEFORE BREAKFAST, Disp: 90 tablet, Rfl: 1   Budeson-Glycopyrrol-Formoterol (BREZTRI  AEROSPHERE) 160-9-4.8 MCG/ACT AERO, Inhale 2 puffs into the lungs 2 (two) times daily., Disp: 10.7 g, Rfl: 5  celecoxib (CELEBREX) 100 MG capsule, Take 200 mg by mouth., Disp: , Rfl:    etanercept (ENBREL) 50 MG/ML injection, Inject 50 mg into the skin once a week., Disp: , Rfl:    fluticasone  (FLONASE ) 50 MCG/ACT nasal spray, SPRAY 2 SPRAYS INTO EACH NOSTRIL EVERY DAY, Disp: 48 mL, Rfl: 5   folic acid (FOLVITE) 1 MG tablet, Take by mouth., Disp: , Rfl:    levocetirizine (XYZAL ) 5 MG tablet, TAKE 1 TABLET BY MOUTH EVERY DAY IN THE EVENING, Disp: 90 tablet, Rfl: 1   lisinopril  (ZESTRIL ) 5 MG tablet, TAKE 1 TABLET BY MOUTH TWICE A DAY, Disp: 180 tablet, Rfl: 1   methotrexate (RHEUMATREX) 2.5 MG tablet, Take 15 mg by mouth once a week., Disp: , Rfl:    rosuvastatin  (CRESTOR ) 10 MG tablet, TAKE 1 TABLET BY MOUTH EVERY DAY, Disp: 90 tablet, Rfl: 1   sildenafil   (REVATIO ) 20 MG tablet, 3-5 TABLETS ONCE DAILY ONE HOUR PRIOR TO INTERCOUSE., Disp: 50 tablet, Rfl: 0   predniSONE  (DELTASONE ) 5 MG tablet, Take 1 tablet (5 mg total) by mouth daily as needed. (Patient not taking: Reported on 11/07/2024), Disp: 30 tablet, Rfl: 0   varenicline  (CHANTIX ) 1 MG tablet, TAKE 1 MG BY MOUTH 2 (TWO) TIMES DAILY. FOLLOWING STARTER PACK (Patient not taking: Reported on 11/07/2024), Disp: 180 tablet, Rfl: 1  "

## 2024-11-07 NOTE — Addendum Note (Signed)
 Addended by: Therma Lasure M on: 11/07/2024 01:30 PM   Modules accepted: Orders

## 2024-11-07 NOTE — Patient Instructions (Addendum)
"    ICD-10-CM   1. COPD, moderate (HCC)  J44.9     2. Rheumatoid arthritis involving multiple sites with positive rheumatoid factor (HCC)  M05.79     3. Interstitial lung abnormality (ILA)  R91.8     4. Cigarette smoker  F17.210     5. Exercise hypoxemia  R09.02      #emphysema  - stable  plan  Continue Albuterol  inhaler 2 puffs every 6 hours as needed f Continue Breztri  2 puffs Twice daily. Need to make sure you're using this every day    #RA/ILA  You do have some changes on your CT scan which could be early interstitial lung disease related to your rheumatoid arthritis (march 2025) but back then radiologist was cocnerned this could be getting worse  Also worsening shortness of breath   Plan  - repeat HRCT in < 3 months - do PFT next vist - do ECHO before next visit  - do sit stand test next visit   #Smoking  - too bad relapsed and on-off smoking but at least making an effort  Plan  - work on quitting  Plan rOV 3 months Ramswamy 15 min "

## 2024-11-14 ENCOUNTER — Ambulatory Visit

## 2024-12-16 ENCOUNTER — Ambulatory Visit

## 2025-01-31 ENCOUNTER — Ambulatory Visit: Admitting: Internal Medicine

## 2025-02-27 ENCOUNTER — Ambulatory Visit: Admitting: Internal Medicine

## 2025-02-27 ENCOUNTER — Encounter
